# Patient Record
Sex: Male | Born: 1955 | Race: White | Hispanic: No | Marital: Married | Smoking: Former smoker
Health system: Southern US, Community
[De-identification: ages and names within clinical notes are randomized; demographics above are authoritative.]

## PROBLEM LIST (undated history)

## (undated) DIAGNOSIS — E785 Hyperlipidemia, unspecified: Secondary | ICD-10-CM

## (undated) DIAGNOSIS — I2510 Atherosclerotic heart disease of native coronary artery without angina pectoris: Secondary | ICD-10-CM

## (undated) DIAGNOSIS — F329 Major depressive disorder, single episode, unspecified: Secondary | ICD-10-CM

## (undated) DIAGNOSIS — F32A Depression, unspecified: Secondary | ICD-10-CM

## (undated) DIAGNOSIS — G4733 Obstructive sleep apnea (adult) (pediatric): Secondary | ICD-10-CM

## (undated) DIAGNOSIS — G2581 Restless legs syndrome: Secondary | ICD-10-CM

## (undated) DIAGNOSIS — G8929 Other chronic pain: Secondary | ICD-10-CM

## (undated) DIAGNOSIS — E119 Type 2 diabetes mellitus without complications: Secondary | ICD-10-CM

## (undated) DIAGNOSIS — G4700 Insomnia, unspecified: Secondary | ICD-10-CM

## (undated) DIAGNOSIS — N400 Enlarged prostate without lower urinary tract symptoms: Secondary | ICD-10-CM

## (undated) DIAGNOSIS — K219 Gastro-esophageal reflux disease without esophagitis: Secondary | ICD-10-CM

## (undated) DIAGNOSIS — G629 Polyneuropathy, unspecified: Secondary | ICD-10-CM

## (undated) DIAGNOSIS — M549 Dorsalgia, unspecified: Secondary | ICD-10-CM

## (undated) HISTORY — DX: Major depressive disorder, single episode, unspecified: F32.9

## (undated) HISTORY — DX: Restless legs syndrome: G25.81

## (undated) HISTORY — DX: Other chronic pain: G89.29

## (undated) HISTORY — PX: OTHER SURGICAL HISTORY: SHX169

## (undated) HISTORY — DX: Obstructive sleep apnea (adult) (pediatric): G47.33

## (undated) HISTORY — DX: Depression, unspecified: F32.A

## (undated) HISTORY — DX: Benign prostatic hyperplasia without lower urinary tract symptoms: N40.0

## (undated) HISTORY — DX: Polyneuropathy, unspecified: G62.9

## (undated) HISTORY — DX: Dorsalgia, unspecified: M54.9

## (undated) HISTORY — DX: Hyperlipidemia, unspecified: E78.5

## (undated) HISTORY — DX: Atherosclerotic heart disease of native coronary artery without angina pectoris: I25.10

## (undated) HISTORY — DX: Type 2 diabetes mellitus without complications: E11.9

## (undated) HISTORY — DX: Insomnia, unspecified: G47.00

## (undated) HISTORY — DX: Gastro-esophageal reflux disease without esophagitis: K21.9

---

## 1998-01-06 ENCOUNTER — Ambulatory Visit (HOSPITAL_COMMUNITY): Admission: RE | Admit: 1998-01-06 | Discharge: 1998-01-06 | Payer: Self-pay | Admitting: Gastroenterology

## 1998-02-13 ENCOUNTER — Encounter: Payer: Self-pay | Admitting: *Deleted

## 1998-02-13 ENCOUNTER — Inpatient Hospital Stay (HOSPITAL_COMMUNITY): Admission: EM | Admit: 1998-02-13 | Discharge: 1998-02-14 | Payer: Self-pay | Admitting: Obstetrics & Gynecology

## 1998-07-20 ENCOUNTER — Ambulatory Visit (HOSPITAL_COMMUNITY): Admission: RE | Admit: 1998-07-20 | Discharge: 1998-07-20 | Payer: Self-pay | Admitting: Gastroenterology

## 1998-07-20 ENCOUNTER — Encounter: Payer: Self-pay | Admitting: Gastroenterology

## 1999-09-28 ENCOUNTER — Ambulatory Visit (HOSPITAL_COMMUNITY): Admission: RE | Admit: 1999-09-28 | Discharge: 1999-09-28 | Payer: Self-pay | Admitting: Gastroenterology

## 2000-02-05 ENCOUNTER — Encounter: Payer: Self-pay | Admitting: Neurology

## 2000-02-05 ENCOUNTER — Encounter: Admission: RE | Admit: 2000-02-05 | Discharge: 2000-02-05 | Payer: Self-pay | Admitting: Neurology

## 2000-03-05 ENCOUNTER — Encounter: Payer: Self-pay | Admitting: Neurology

## 2000-03-05 ENCOUNTER — Ambulatory Visit (HOSPITAL_COMMUNITY): Admission: RE | Admit: 2000-03-05 | Discharge: 2000-03-05 | Payer: Self-pay | Admitting: Neurology

## 2001-02-03 ENCOUNTER — Encounter: Admission: RE | Admit: 2001-02-03 | Discharge: 2001-05-04 | Payer: Self-pay | Admitting: Anesthesiology

## 2001-02-05 ENCOUNTER — Encounter: Admission: RE | Admit: 2001-02-05 | Discharge: 2001-02-05 | Payer: Self-pay

## 2001-03-27 ENCOUNTER — Encounter (INDEPENDENT_AMBULATORY_CARE_PROVIDER_SITE_OTHER): Payer: Self-pay

## 2001-03-27 ENCOUNTER — Ambulatory Visit (HOSPITAL_COMMUNITY): Admission: RE | Admit: 2001-03-27 | Discharge: 2001-03-27 | Payer: Self-pay | Admitting: Gastroenterology

## 2001-06-04 ENCOUNTER — Encounter: Admission: RE | Admit: 2001-06-04 | Discharge: 2001-09-02 | Payer: Self-pay

## 2001-07-23 ENCOUNTER — Ambulatory Visit (HOSPITAL_COMMUNITY): Admission: RE | Admit: 2001-07-23 | Discharge: 2001-07-23 | Payer: Self-pay

## 2001-09-15 ENCOUNTER — Encounter: Admission: RE | Admit: 2001-09-15 | Discharge: 2001-12-14 | Payer: Self-pay

## 2001-11-09 ENCOUNTER — Encounter: Payer: Self-pay | Admitting: Family Medicine

## 2001-11-09 ENCOUNTER — Encounter: Admission: RE | Admit: 2001-11-09 | Discharge: 2001-11-09 | Payer: Self-pay | Admitting: Family Medicine

## 2001-12-25 ENCOUNTER — Encounter: Admission: RE | Admit: 2001-12-25 | Discharge: 2002-03-02 | Payer: Self-pay

## 2002-04-15 ENCOUNTER — Encounter: Admission: RE | Admit: 2002-04-15 | Discharge: 2002-07-14 | Payer: Self-pay

## 2002-06-08 ENCOUNTER — Encounter: Admission: RE | Admit: 2002-06-08 | Discharge: 2002-06-08 | Payer: Self-pay | Admitting: *Deleted

## 2002-06-08 ENCOUNTER — Encounter: Payer: Self-pay | Admitting: *Deleted

## 2002-06-09 ENCOUNTER — Ambulatory Visit (HOSPITAL_BASED_OUTPATIENT_CLINIC_OR_DEPARTMENT_OTHER): Admission: RE | Admit: 2002-06-09 | Discharge: 2002-06-09 | Payer: Self-pay | Admitting: *Deleted

## 2002-08-18 ENCOUNTER — Encounter: Admission: RE | Admit: 2002-08-18 | Discharge: 2002-11-16 | Payer: Self-pay

## 2002-09-21 ENCOUNTER — Ambulatory Visit (HOSPITAL_COMMUNITY): Admission: RE | Admit: 2002-09-21 | Discharge: 2002-09-21 | Payer: Self-pay

## 2002-12-10 ENCOUNTER — Encounter
Admission: RE | Admit: 2002-12-10 | Discharge: 2003-03-10 | Payer: Self-pay | Admitting: Physical Medicine & Rehabilitation

## 2003-06-14 ENCOUNTER — Encounter
Admission: RE | Admit: 2003-06-14 | Discharge: 2003-09-12 | Payer: Self-pay | Admitting: Physical Medicine & Rehabilitation

## 2003-10-07 ENCOUNTER — Encounter
Admission: RE | Admit: 2003-10-07 | Discharge: 2004-01-05 | Payer: Self-pay | Admitting: Physical Medicine & Rehabilitation

## 2004-01-12 ENCOUNTER — Encounter
Admission: RE | Admit: 2004-01-12 | Discharge: 2004-04-11 | Payer: Self-pay | Admitting: Physical Medicine & Rehabilitation

## 2004-02-28 ENCOUNTER — Ambulatory Visit: Payer: Self-pay | Admitting: Anesthesiology

## 2004-05-11 ENCOUNTER — Encounter
Admission: RE | Admit: 2004-05-11 | Discharge: 2004-07-26 | Payer: Self-pay | Admitting: Physical Medicine & Rehabilitation

## 2004-05-22 ENCOUNTER — Ambulatory Visit (HOSPITAL_COMMUNITY): Admission: RE | Admit: 2004-05-22 | Discharge: 2004-05-22 | Payer: Self-pay | Admitting: Anesthesiology

## 2004-06-19 ENCOUNTER — Ambulatory Visit: Payer: Self-pay | Admitting: Anesthesiology

## 2004-07-10 ENCOUNTER — Ambulatory Visit: Payer: Self-pay | Admitting: Cardiovascular Disease

## 2004-07-26 ENCOUNTER — Encounter: Admission: RE | Admit: 2004-07-26 | Discharge: 2004-10-24 | Payer: Self-pay | Admitting: Family Medicine

## 2004-10-02 ENCOUNTER — Ambulatory Visit: Payer: Self-pay | Admitting: Physical Medicine & Rehabilitation

## 2004-11-02 ENCOUNTER — Encounter
Admission: RE | Admit: 2004-11-02 | Discharge: 2005-01-31 | Payer: Self-pay | Admitting: Physical Medicine & Rehabilitation

## 2004-11-28 ENCOUNTER — Ambulatory Visit: Payer: Self-pay | Admitting: Physical Medicine & Rehabilitation

## 2005-01-22 ENCOUNTER — Ambulatory Visit: Payer: Self-pay | Admitting: Cardiovascular Disease

## 2005-02-11 ENCOUNTER — Ambulatory Visit: Payer: Self-pay | Admitting: Cardiovascular Disease

## 2005-03-01 ENCOUNTER — Encounter: Admission: RE | Admit: 2005-03-01 | Discharge: 2005-05-30 | Payer: Self-pay | Admitting: Anesthesiology

## 2005-03-05 ENCOUNTER — Ambulatory Visit: Payer: Self-pay | Admitting: Anesthesiology

## 2005-03-05 ENCOUNTER — Ambulatory Visit (HOSPITAL_COMMUNITY): Admission: RE | Admit: 2005-03-05 | Discharge: 2005-03-05 | Payer: Self-pay | Admitting: Anesthesiology

## 2005-07-03 ENCOUNTER — Ambulatory Visit: Admission: RE | Admit: 2005-07-03 | Discharge: 2005-07-03 | Payer: Self-pay | Admitting: Orthopaedic Surgery

## 2005-08-21 ENCOUNTER — Ambulatory Visit: Payer: Self-pay | Admitting: Cardiovascular Disease

## 2005-09-10 ENCOUNTER — Ambulatory Visit (HOSPITAL_BASED_OUTPATIENT_CLINIC_OR_DEPARTMENT_OTHER): Admission: RE | Admit: 2005-09-10 | Discharge: 2005-09-10 | Payer: Self-pay | Admitting: Orthopaedic Surgery

## 2006-01-29 ENCOUNTER — Ambulatory Visit: Payer: Self-pay

## 2006-02-04 ENCOUNTER — Encounter: Admission: RE | Admit: 2006-02-04 | Discharge: 2006-02-04 | Payer: Self-pay | Admitting: Orthopaedic Surgery

## 2006-02-06 ENCOUNTER — Ambulatory Visit: Payer: Self-pay | Admitting: Cardiovascular Disease

## 2006-02-07 ENCOUNTER — Ambulatory Visit: Payer: Self-pay | Admitting: Cardiovascular Disease

## 2006-02-07 ENCOUNTER — Inpatient Hospital Stay (HOSPITAL_BASED_OUTPATIENT_CLINIC_OR_DEPARTMENT_OTHER): Admission: RE | Admit: 2006-02-07 | Discharge: 2006-02-07 | Payer: Self-pay | Admitting: Cardiovascular Disease

## 2006-02-18 ENCOUNTER — Ambulatory Visit: Payer: Self-pay | Admitting: Cardiovascular Disease

## 2006-02-20 ENCOUNTER — Inpatient Hospital Stay (HOSPITAL_COMMUNITY): Admission: RE | Admit: 2006-02-20 | Discharge: 2006-02-23 | Payer: Self-pay | Admitting: Orthopaedic Surgery

## 2006-08-14 ENCOUNTER — Ambulatory Visit: Payer: Self-pay | Admitting: Cardiovascular Disease

## 2007-03-25 ENCOUNTER — Ambulatory Visit: Payer: Self-pay | Admitting: Cardiovascular Disease

## 2007-03-26 ENCOUNTER — Ambulatory Visit: Payer: Self-pay

## 2007-03-26 ENCOUNTER — Ambulatory Visit: Payer: Self-pay | Admitting: Pulmonary Disease

## 2007-04-21 ENCOUNTER — Ambulatory Visit (HOSPITAL_BASED_OUTPATIENT_CLINIC_OR_DEPARTMENT_OTHER): Admission: RE | Admit: 2007-04-21 | Discharge: 2007-04-21 | Payer: Self-pay | Admitting: Orthopedic Surgery

## 2007-07-31 ENCOUNTER — Encounter: Admission: RE | Admit: 2007-07-31 | Discharge: 2007-07-31 | Payer: Self-pay | Admitting: Orthopedic Surgery

## 2007-09-24 ENCOUNTER — Ambulatory Visit: Payer: Self-pay | Admitting: Cardiovascular Disease

## 2008-02-23 ENCOUNTER — Encounter: Payer: Self-pay | Admitting: Pulmonary Disease

## 2008-08-15 ENCOUNTER — Encounter: Admission: RE | Admit: 2008-08-15 | Discharge: 2008-08-15 | Payer: Self-pay | Admitting: Family Medicine

## 2008-08-16 ENCOUNTER — Ambulatory Visit: Payer: Self-pay | Admitting: Cardiovascular Disease

## 2009-01-30 ENCOUNTER — Encounter (INDEPENDENT_AMBULATORY_CARE_PROVIDER_SITE_OTHER): Payer: Self-pay | Admitting: *Deleted

## 2009-05-01 DIAGNOSIS — K219 Gastro-esophageal reflux disease without esophagitis: Secondary | ICD-10-CM | POA: Insufficient documentation

## 2009-05-01 DIAGNOSIS — E119 Type 2 diabetes mellitus without complications: Secondary | ICD-10-CM

## 2009-05-01 DIAGNOSIS — G4700 Insomnia, unspecified: Secondary | ICD-10-CM | POA: Insufficient documentation

## 2009-05-01 DIAGNOSIS — I10 Essential (primary) hypertension: Secondary | ICD-10-CM | POA: Insufficient documentation

## 2009-05-01 DIAGNOSIS — F329 Major depressive disorder, single episode, unspecified: Secondary | ICD-10-CM

## 2009-05-01 DIAGNOSIS — F3289 Other specified depressive episodes: Secondary | ICD-10-CM | POA: Insufficient documentation

## 2009-05-01 DIAGNOSIS — I2510 Atherosclerotic heart disease of native coronary artery without angina pectoris: Secondary | ICD-10-CM

## 2009-05-02 ENCOUNTER — Ambulatory Visit: Payer: Self-pay | Admitting: Cardiovascular Disease

## 2009-05-02 DIAGNOSIS — R0602 Shortness of breath: Secondary | ICD-10-CM

## 2009-05-19 ENCOUNTER — Encounter: Payer: Self-pay | Admitting: Pulmonary Disease

## 2009-06-05 ENCOUNTER — Ambulatory Visit: Payer: Self-pay

## 2009-06-05 ENCOUNTER — Encounter: Payer: Self-pay | Admitting: Cardiovascular Disease

## 2009-06-05 ENCOUNTER — Ambulatory Visit: Payer: Self-pay | Admitting: Cardiology

## 2009-06-05 ENCOUNTER — Ambulatory Visit (HOSPITAL_COMMUNITY): Admission: RE | Admit: 2009-06-05 | Discharge: 2009-06-05 | Payer: Self-pay | Admitting: Cardiovascular Disease

## 2009-08-01 ENCOUNTER — Telehealth: Payer: Self-pay | Admitting: Cardiovascular Disease

## 2009-08-02 ENCOUNTER — Encounter: Payer: Self-pay | Admitting: Pulmonary Disease

## 2009-08-28 ENCOUNTER — Telehealth: Payer: Self-pay | Admitting: Cardiovascular Disease

## 2009-08-29 ENCOUNTER — Ambulatory Visit: Payer: Self-pay | Admitting: Pulmonary Disease

## 2009-08-29 DIAGNOSIS — G4733 Obstructive sleep apnea (adult) (pediatric): Secondary | ICD-10-CM

## 2009-08-31 LAB — CONVERTED CEMR LAB
ALT: 19 units/L (ref 0–53)
AST: 24 units/L (ref 0–37)
Albumin: 4.5 g/dL (ref 3.5–5.2)
Alkaline Phosphatase: 49 units/L (ref 39–117)
Basophils Relative: 0.5 % (ref 0.0–3.0)
Bilirubin, Direct: 0.1 mg/dL (ref 0.0–0.3)
Creatinine, Ser: 1.1 mg/dL (ref 0.4–1.5)
Eosinophils Relative: 0.5 % (ref 0.0–5.0)
Ferritin: 38.8 ng/mL (ref 22.0–322.0)
Folate: 9.4 ng/mL
GFR calc non Af Amer: 74.13 mL/min (ref >60)
Glucose, Bld: 85 mg/dL (ref 70–99)
HCT: 45.6 % (ref 39.0–52.0)
Hemoglobin: 15.1 g/dL (ref 13.0–17.0)
Iron: 88 ug/dL (ref 42–165)
Lymphocytes Relative: 22.2 % (ref 12.0–46.0)
Lymphs Abs: 2.1 10*3/uL (ref 0.7–4.0)
MCHC: 33.1 g/dL (ref 30.0–36.0)
MCV: 96.1 fL (ref 78.0–100.0)
Monocytes Absolute: 0.4 10*3/uL (ref 0.1–1.0)
Monocytes Relative: 4.3 % (ref 3.0–12.0)
Neutro Abs: 7 10*3/uL (ref 1.4–7.7)
Platelets: 225 10*3/uL (ref 150.0–400.0)
Potassium: 3.9 meq/L (ref 3.5–5.1)
RDW: 12.1 % (ref 11.5–14.6)
Saturation Ratios: 21.1 % (ref 20.0–50.0)
Sodium: 141 meq/L (ref 135–145)
Total Bilirubin: 0.7 mg/dL (ref 0.3–1.2)
Total Protein: 7.2 g/dL (ref 6.0–8.3)
Transferrin: 298.6 mg/dL (ref 212.0–360.0)
Vitamin B-12: 214 pg/mL (ref 211–911)
WBC: 9.5 10*3/uL (ref 4.5–10.5)

## 2009-09-14 ENCOUNTER — Encounter (INDEPENDENT_AMBULATORY_CARE_PROVIDER_SITE_OTHER): Payer: Self-pay | Admitting: *Deleted

## 2009-10-02 ENCOUNTER — Ambulatory Visit: Payer: Self-pay | Admitting: Pulmonary Disease

## 2009-10-04 ENCOUNTER — Encounter: Payer: Self-pay | Admitting: Pulmonary Disease

## 2009-10-05 ENCOUNTER — Ambulatory Visit: Payer: Self-pay | Admitting: Pulmonary Disease

## 2009-10-17 ENCOUNTER — Telehealth (INDEPENDENT_AMBULATORY_CARE_PROVIDER_SITE_OTHER): Payer: Self-pay | Admitting: *Deleted

## 2009-10-18 ENCOUNTER — Telehealth (INDEPENDENT_AMBULATORY_CARE_PROVIDER_SITE_OTHER): Payer: Self-pay | Admitting: *Deleted

## 2009-10-26 ENCOUNTER — Telehealth (INDEPENDENT_AMBULATORY_CARE_PROVIDER_SITE_OTHER): Payer: Self-pay | Admitting: *Deleted

## 2009-11-20 ENCOUNTER — Ambulatory Visit: Payer: Self-pay | Admitting: Pulmonary Disease

## 2009-11-20 LAB — CONVERTED CEMR LAB
Basophils Absolute: 0 10*3/uL (ref 0.0–0.1)
Basophils Relative: 0.4 % (ref 0.0–3.0)
Eosinophils Absolute: 0.1 10*3/uL (ref 0.0–0.7)
Eosinophils Relative: 1.7 % (ref 0.0–5.0)
Ferritin: 73.9 ng/mL (ref 22.0–322.0)
HCT: 40.6 % (ref 39.0–52.0)
Hemoglobin: 14 g/dL (ref 13.0–17.0)
Iron: 76 ug/dL (ref 42–165)
Lymphocytes Relative: 26.8 % (ref 12.0–46.0)
Lymphs Abs: 1.7 10*3/uL (ref 0.7–4.0)
MCHC: 34.4 g/dL (ref 30.0–36.0)
MCV: 94.1 fL (ref 78.0–100.0)
Monocytes Absolute: 0.5 10*3/uL (ref 0.1–1.0)
Monocytes Relative: 8.2 % (ref 3.0–12.0)
Neutro Abs: 4.1 10*3/uL (ref 1.4–7.7)
Neutrophils Relative %: 62.9 % (ref 43.0–77.0)
Platelets: 203 10*3/uL (ref 150.0–400.0)
RBC: 4.32 M/uL (ref 4.22–5.81)
RDW: 13.3 % (ref 11.5–14.6)
Saturation Ratios: 22.8 % (ref 20.0–50.0)
Transferrin: 237.7 mg/dL (ref 212.0–360.0)
WBC: 6.5 10*3/uL (ref 4.5–10.5)

## 2010-01-15 ENCOUNTER — Telehealth (INDEPENDENT_AMBULATORY_CARE_PROVIDER_SITE_OTHER): Payer: Self-pay | Admitting: *Deleted

## 2010-01-30 ENCOUNTER — Ambulatory Visit: Payer: Self-pay | Admitting: Cardiovascular Disease

## 2010-02-09 ENCOUNTER — Ambulatory Visit: Payer: Self-pay | Admitting: Internal Medicine

## 2010-02-09 ENCOUNTER — Ambulatory Visit: Payer: Self-pay | Admitting: Cardiology

## 2010-02-09 ENCOUNTER — Encounter: Payer: Self-pay | Admitting: Cardiovascular Disease

## 2010-02-09 ENCOUNTER — Ambulatory Visit: Payer: Self-pay

## 2010-02-09 ENCOUNTER — Ambulatory Visit (HOSPITAL_COMMUNITY): Admission: RE | Admit: 2010-02-09 | Discharge: 2010-02-09 | Payer: Self-pay | Admitting: Cardiovascular Disease

## 2010-02-19 ENCOUNTER — Ambulatory Visit: Payer: Self-pay | Admitting: Pulmonary Disease

## 2010-04-03 ENCOUNTER — Telehealth: Payer: Self-pay | Admitting: Pulmonary Disease

## 2010-04-16 ENCOUNTER — Telehealth: Payer: Self-pay | Admitting: Cardiovascular Disease

## 2010-04-23 ENCOUNTER — Telehealth: Payer: Self-pay | Admitting: Pulmonary Disease

## 2010-05-09 ENCOUNTER — Ambulatory Visit: Payer: Self-pay | Admitting: Pulmonary Disease

## 2010-05-09 DIAGNOSIS — C9000 Multiple myeloma not having achieved remission: Secondary | ICD-10-CM

## 2010-05-24 ENCOUNTER — Telehealth (INDEPENDENT_AMBULATORY_CARE_PROVIDER_SITE_OTHER): Payer: Self-pay | Admitting: *Deleted

## 2010-07-01 LAB — CONVERTED CEMR LAB
ALT: 21 units/L (ref 0–53)
AST: 23 units/L (ref 0–37)
Albumin: 4.1 g/dL (ref 3.5–5.2)
Alkaline Phosphatase: 49 units/L (ref 39–117)
BUN: 13 mg/dL (ref 6–23)
Basophils Absolute: 0 10*3/uL (ref 0.0–0.1)
Basophils Relative: 0.4 % (ref 0.0–3.0)
Bilirubin, Direct: 0.1 mg/dL (ref 0.0–0.3)
CO2: 26 meq/L (ref 19–32)
Calcium: 9.1 mg/dL (ref 8.4–10.5)
Chloride: 103 meq/L (ref 96–112)
Creatinine, Ser: 0.9 mg/dL (ref 0.4–1.5)
Eosinophils Absolute: 0.1 10*3/uL (ref 0.0–0.7)
Eosinophils Relative: 1.5 % (ref 0.0–5.0)
GFR calc non Af Amer: 97.02 mL/min (ref >60)
Glucose, Bld: 132 mg/dL — ABNORMAL HIGH (ref 70–99)
HCT: 41.8 % (ref 39.0–52.0)
Hemoglobin: 14.3 g/dL (ref 13.0–17.0)
Lymphocytes Relative: 29.4 % (ref 12.0–46.0)
Lymphs Abs: 1.4 10*3/uL (ref 0.7–4.0)
MCHC: 34.1 g/dL (ref 30.0–36.0)
MCV: 96.3 fL (ref 78.0–100.0)
Monocytes Absolute: 0.4 10*3/uL (ref 0.1–1.0)
Monocytes Relative: 8.9 % (ref 3.0–12.0)
Neutro Abs: 2.8 10*3/uL (ref 1.4–7.7)
Neutrophils Relative %: 59.8 % (ref 43.0–77.0)
Platelets: 200 10*3/uL (ref 150.0–400.0)
Potassium: 4.9 meq/L (ref 3.5–5.1)
RBC: 4.35 M/uL (ref 4.22–5.81)
RDW: 13.5 % (ref 11.5–14.6)
Sodium: 138 meq/L (ref 135–145)
Total Bilirubin: 0.4 mg/dL (ref 0.3–1.2)
Total Protein: 6.8 g/dL (ref 6.0–8.3)
WBC: 4.7 10*3/uL (ref 4.5–10.5)

## 2010-07-03 NOTE — Assessment & Plan Note (Signed)
Summary: insomnia/apc   Primary Provider/Referring Provider:  Lupita Raider  CC:  Sleep consult for insomnia. Patient had sleep study in 2009.Marland Kitchen  History of Present Illness: 55 yo male for evaluation insomnia.  He has been having trouble with his sleep for the past 10 to 12  years.  He does not recall any specific event which triggered his sleep problem.  Of note is that he had a heart attack about 12 years ago.  He has difficulty falling asleep, and staying asleep.  He has been taking ambien 15 mg about 2 hours before going to bed.  He tries to go to bed between 9 pm and 11pm.  He is always worrying about things, and says he has an active mind at night.  He has also tried using lunesta and restoril.  He is using klonopin and xanax for anxiety.  He will take several hours to fall asleep, and will at times try to sleep on the cough.  He will also watch TV, eat, or read.  He gets very anxious about not being able to fall asleep, and tends to look at the clock at night.  He will sleep for two hours, and then wake up.  He then remains awake for the rest of the night.  He has tried taking naps during the day, but is not able to sleep.  He does snore, and this is worse when he is sleeping on his back.  He will occasionally talk in his sleep.  There is no history of sleep walking or bruxism.  He does kick his legs a lot while asleep.  He was told he has neuropathy, and has undergone several back surgeries.  He gets vivid dreams frequently.  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  He gets tired, and feels jittery from her nerves while at work.  He does not drink much alcohol, and drinks a cup of coffee around 3pm.  He has been feeling depressed, and feels like he is losing control.  He has been fighting alot with his wife over trivial matters.  He feels inadequate, and says that he has no sex drive.  He thought this might have been from prozac.  He was switched to wellbutrin, but this has  made him feel more angry.  He has also been coping with his father's diagnosis of bladder cancer in 2009.  He has not seen a psychiatrist before.  He did see a sleep specialist before.   Sleep study from Sept. 22, 2009.  The AHI was 5.5 and RDI was 7.7.  He appeared to have a positional component.   Preventive Screening-Counseling & Management  Alcohol-Tobacco     Smoking Status: current     Year Quit: 2007     Pack years: 30 year smoker x1 ppd     Cigars/week: 1-2 cigars when playing golf  Current Medications (verified): 1)  Lyrica 75 Mg Caps (Pregabalin) .Marland Kitchen.. 1 Tab By Mouth Once Daily 2)  Vytorin 10-40 Mg Tabs (Ezetimibe-Simvastatin) .... Take One Tablet By Mouth Dailyat Bedtime 3)  Oxycodone-Acetaminophen 5-325 Mg Tabs (Oxycodone-Acetaminophen) .Marland Kitchen.. 1 Tab By Mouth Once Daily 4)  Metformin Hcl 500 Mg Tabs (Metformin Hcl) .Marland Kitchen.. 1 Tab Two Times A Day 5)  Lisinopril 10 Mg Tabs (Lisinopril) .Marland Kitchen.. 1 Tab By Mouth Once Daily 6)  Ambien Cr 12.5 Mg Cr-Tabs (Zolpidem Tartrate) .Marland Kitchen.. 1 By Mouth At Bedtime 7)  Flomax 0.4 Mg Caps (Tamsulosin Hcl) .Marland Kitchen.. 1 Tab By Mouth Once  Daily 8)  Nexium 40 Mg Cpdr (Esomeprazole Magnesium) .Marland Kitchen.. 1 Tab By Mouth Once Daily 9)  Wellbutrin Xl 150 Mg Xr24h-Tab (Bupropion Hcl) .Marland Kitchen.. 1 By Mouth Two Times A Day 10)  Fish Oil   Oil (Fish Oil) .Marland Kitchen.. 1 Tab By Mouth Once Daily 11)  Plavix 75 Mg Tabs (Clopidogrel Bisulfate) .... Take One Tablet By Mouth Daily 12)  Klonopin 1 Mg Tabs (Clonazepam) .... 2 By Mouth At Bedtime 13)  Xanax 0.5 Mg Tabs (Alprazolam) .... 2-4 By Mouth At Bedtime As Needed  Allergies (verified): 1)  ! Codeine  Past History:  Past Medical History: Hypertension CAD      - Stents 99', 05', patent circ and IM stents cath 2007 myovue 10/08 non-ischemic Hyperlipidemia Insomnia Depression GERD Diabetes mellitus Peripheral neuropathy Chronic back pain Prostatism  Past Surgical History: Reviewed history from 05/01/2009 and no changes required. Release  of right long finger A1 pulley with debridement of tenosynovitis.     L4-5 epidural steroid injection with fluoroscopic guidance.   He had 95% lesion in the AV circumflex that was dilated and stented with an AVE stent. He also a 60% lesion in the OM-I and minor disease in the LAD and RCA 02/14/98 Physican Dr. Graceann Congress M.D. cc Dr Antoine Poche M.D.  Family History: Reviewed history from 05/01/2009 and no changes required.  Positive for heart disease in the father, hypertension in  the mother, and diabetes in the father.  Social History: Reviewed history from 05/01/2009 and no changes required.  Patient is married.  He is a Psychologist, sport and exercise.  He has no  intake of alcohol or tobacco products.  Does chew Nicorette Gum.  He has no  children.  Caregiver after surgery will be his spouse.Smoking Status:  current Pack years:  30 year smoker x1 ppd Cigars/week:  1-2 cigars when playing golf  Vital Signs:  Patient profile:   55 year old male Height:      72 inches (182.88 cm) Weight:      178 pounds (80.91 kg) BMI:     24.23 O2 Sat:      94 % on Room air Temp:     98.5 degrees F (36.94 degrees C) oral Pulse rate:   86 / minute BP sitting:   100 / 60  (left arm) Cuff size:   regular  Vitals Entered By: Michel Bickers CMA (August 29, 2009 3:22 PM)  O2 Sat at Rest %:  94 O2 Flow:  Room air CC: Sleep consult for insomnia. Patient had sleep study in 2009. Is Patient Diabetic? Yes   Physical Exam  General:  thin.   Eyes:  PERRLA and EOMI.   Nose:  no deformity, discharge, inflammation, or lesions Mouth:  MP 3, no oral lesions Neck:  no JVD.   Chest Wall:  no deformities noted Lungs:  clear bilaterally to auscultation and percussion Heart:  regular rate and rhythm, S1, S2 without murmurs, rubs, gallops, or clicks Abdomen:  bowel sounds positive; abdomen soft and non-tender without masses, or organomegaly Pulses:  pulses normal Extremities:  no clubbing, cyanosis, edema, or deformity  noted Neurologic:  normal CN II-XII, gait normal, and strength normal.   Cervical Nodes:  no significant adenopathy Psych:  depressed affect, anxious, and poor concentration.     Impression & Recommendations:  Problem # 1:  INSOMNIA (ICD-780.52) He has sleep onset and sleep maintenance insomnia.  In addition he has poor sleep hygiene.  My suspicion is that his heart attack in 1999  was the triggering event for his poor sleep habits compounded by his depression and anxiety.  I have asked him to keep a sleep journal.  I explained the importance of stimulus control, relaxation techniques, and sleep restriction.  I will continue him on his current sleep aides for now.  I will also start him on seroquel 50 mg at bedtime to help with sleep initiation and to see if this stablizes his mood also.  Problem # 2:  DEPRESSION (ICD-311) A good portion of his sleep problems are related to his mood.  In addition his sleep problems are exacerbating his mood disorder.  To further evaluate this I will refer him to a behavioral health specialist.  He specifically denied any suicidal or homicidal thoughts.  Will start seroquel 50 mg at bedtime.  Would defer further adjustments in his mood stabilizing medications to primary care and behavioral health.  Problem # 3:  LEG PAIN (ICD-729.5) He has a history of neuropathy.  His leg symptoms seem to get worse at night.  He may also have restless leg syndrome.  I will check his iron levels to see if iron supplementation may help his leg symptoms.  Problem # 4:  OBSTRUCTIVE SLEEP APNEA (ICD-327.23) His sleep study from 2009 showed mild sleep apnea.  Will arrange for [facility] home sleep test to further evaluate whether this may be contributing to his sleep difficulties.  Medications Added to Medication List This Visit: 1)  Seroquel 50 Mg Tabs (Quetiapine fumarate) .... One by mouth at bedtime 2)  Wellbutrin Xl 150 Mg Xr24h-tab (Bupropion hcl) .Marland Kitchen.. 1 by mouth two times a  day 3)  Ambien Cr 12.5 Mg Cr-tabs (Zolpidem tartrate) .Marland Kitchen.. 1 by mouth at bedtime 4)  Klonopin 1 Mg Tabs (Clonazepam) .... 2 by mouth at bedtime 5)  Xanax 0.5 Mg Tabs (Alprazolam) .... 2-4 by mouth at bedtime as needed  Complete Medication List: 1)  Plavix 75 Mg Tabs (Clopidogrel bisulfate) .... Take one tablet by mouth daily 2)  Vytorin 10-40 Mg Tabs (Ezetimibe-simvastatin) .... Take one tablet by mouth dailyat bedtime 3)  Fish Oil Oil (Fish oil) .Marland Kitchen.. 1 tab by mouth once daily 4)  Lisinopril 10 Mg Tabs (Lisinopril) .Marland Kitchen.. 1 tab by mouth once daily 5)  Metformin Hcl 500 Mg Tabs (Metformin hcl) .Marland Kitchen.. 1 tab two times a day 6)  Nexium 40 Mg Cpdr (Esomeprazole magnesium) .Marland Kitchen.. 1 tab by mouth once daily 7)  Flomax 0.4 Mg Caps (Tamsulosin hcl) .Marland Kitchen.. 1 tab by mouth once daily 8)  Seroquel 50 Mg Tabs (Quetiapine fumarate) .... One by mouth at bedtime 9)  Wellbutrin Xl 150 Mg Xr24h-tab (Bupropion hcl) .Marland Kitchen.. 1 by mouth two times a day 10)  Ambien Cr 12.5 Mg Cr-tabs (Zolpidem tartrate) .Marland Kitchen.. 1 by mouth at bedtime 11)  Klonopin 1 Mg Tabs (Clonazepam) .... 2 by mouth at bedtime 12)  Xanax 0.5 Mg Tabs (Alprazolam) .... 2-4 by mouth at bedtime as needed 13)  Lyrica 75 Mg Caps (Pregabalin) .Marland Kitchen.. 1 tab by mouth once daily 14)  Oxycodone-acetaminophen 5-325 Mg Tabs (Oxycodone-acetaminophen) .Marland Kitchen.. 1 tab by mouth once daily  Other Orders: Consultation Level IV ([address]) Sleep Disorder Referral (Sleep Disorder) Psychiatric Referral (Psych) TLB-BMP (Basic Metabolic Panel-BMET) (80048-METABOL) TLB-CBC Platelet - w/Differential (85025-CBCD) TLB-Hepatic/Liver Function Pnl (80076-HEPATIC) TLB-B12 + Folate Pnl (60454_09811-B14/NWG) TLB-IBC Pnl (Iron/FE;Transferrin) (83550-IBC) TLB-Ferritin (82728-FER)  Patient Instructions: 1)  Keep sleep diary for two weeks 2)  Try breathing exercises when having trouble falling asleep 3)  Use your bedroom for  sleeping only 4)  Try writing down your thoughts before going to bed 5)   Stick to a strict sleep and wake time 6)  Seroquel 50 mg at bedtime 7)  Will refer to behavioral health specialist 8)  Blood test today 9)  Will schedule sleep test at home ([facility]) 10)  Will arrange for psychiatry referral 11)  Follow up in 3 weeks Prescriptions: SEROQUEL 50 MG TABS (QUETIAPINE FUMARATE) one by mouth at bedtime  #30 x 1   Entered by:   Zackery Barefoot CMA   Authorized by:   Coralyn Helling MD   Signed by:   Zackery Barefoot CMA on 08/29/2009   Method used:   [facility]        [facility]  [place] [address]       [address]       [place], [place]  [address]       Ph: [phone] or [phone]       Fax: [phone]   RxID:   [id number] SEROQUEL 50 MG TABS (QUETIAPINE FUMARATE) one by mouth at bedtime  #30 x 1   Entered and Authorized by:   Coralyn Helling MD   Signed by:   Coralyn Helling MD on 08/29/2009   Method used:   Print then Give to Patient   RxID:   [id number]

## 2010-07-03 NOTE — Progress Notes (Signed)
Summary: rx refill on ambien  Phone Note Call from Patient   Caller: Patient Call For: [name] [name] Summary of Call: pt wants at least a 10 day supply or a month's supply of zolpidem tartrate (generic ambian). also wants rx called in to [facility] for 3 months supply. pt's cell [phone] Initial call taken by: Tivis Ringer, CNA,  April 23, 2010 12:48 PM  Follow-up for Phone Call        Needs appt- LMOMTCB Vernie Murders  April 23, 2010 2:27 PM  Spoke with pt and advised needs followup sched before refilling med.  He states that the reason for his last noshow was that he is being tested for bonemarrow CA and has been going through alot.  I advised that we can go ahead and refill zolpidem for 30 day supply, but I will need to get approval from [facility] for 90 day supply.  Pt is fine with this.  He sched rov with [facility] for 05/09/10 at 12 noon.  Pls advise if okay to send 90 day supply zolpidem, thanks!  Follow-up by: Vernie Murders,  April 23, 2010 2:54 PM  Additional Follow-up for Phone Call Additional follow up Details #1::        Yes, it is okay to fill 90 day script. Additional Follow-up by: Coralyn Helling MD,  April 24, 2010 6:28 AM    Additional Follow-up for Phone Call Additional follow up Details #2::    [facility] and [facility]. Carron Curie CMA  April 24, 2010 8:58 AM   Prescriptions: ZOLPIDEM TARTRATE 10 MG TABS (ZOLPIDEM TARTRATE) one by mouth at bedtime  #90 x 0   Entered by:   Carron Curie CMA   Authorized by:   Coralyn Helling MD   Signed by:   Carron Curie CMA on 04/24/2010   Method used:   Telephoned to ...       [facility] (mail-order)             , [place]         Ph: [phone]       Fax: [phone]   RxID:   [id number] ZOLPIDEM TARTRATE 10 MG TABS (ZOLPIDEM TARTRATE) one by mouth at bedtime  #30 x 0   Entered by:   Vernie Murders   Authorized by:   Coralyn Helling MD   Signed by:   Vernie Murders on 04/23/2010   Method used:    Telephoned to ...       [facility]  [place] [address] (retail)       [address]       [place], [place]  [address]       Ph: [phone] or [phone]       Fax: [phone]   RxID:   [id number]

## 2010-07-03 NOTE — Letter (Signed)
Summary: [name] [name] at [facility]   Imported By: Lennie Odor 02/26/2010 11:26:20  _____________________________________________________________________  External Attachment:    Type:   Image     Comment:   External Document

## 2010-07-03 NOTE — Assessment & Plan Note (Signed)
Summary: 3 months/apc   Copy to:  Burna Sis, Charlton Haws Primary Provider/Referring Provider:  Lupita Raider  CC:  3 month followup.  Pt states not sleeping well due to back pain and neuropathy.  He states that he has tried sleeping on his side and this does not help.  He states that he never consulted with Dentist regarding dental appliance. Marland Kitchen  History of Present Illness: 55 yo male with insomnia, RLS, depression, and OSA.  He has not checked with his dentist yet about whether he could get an oral appliance.  He has been getting more trouble with pains in his legs.  He was told he has neuropathy, and was started on lyrica.  This has helped.  He goes to bed at 1030 pm.  He falls asleep after about 30 min.  He uses 50 mg of seroquel and 10 mg zolpidem before going to bed.  He is sleeping through the night.  He wakes up at 7am, and feels okay.  He does okay during the day when he is busy, but will get drowzy if he is sitting quiet.  He has been getting trouble with his breathing.  He quit smoking 8 years ago, and used to smoke 2 packs per day.  He gets winded with exertion, and gets chest tightness.  He has sinus congestion, and has allergies with ragweed.  He does not have much cough or sputum.  He does not wheeze.  He denies recent fever, and has not coughed up blood.  Labs from 01/30/10 were reviewed and unremarkable.  CXR  Procedure date:  01/31/2010  Findings:       CHEST - 2 VIEW    Comparison: 05/02/2009    FINDINGS:  The lungs are mildly hyperexpanded but clear   bilaterally.  No confluent airspace opacities, pleural effuions or   pneumothoracies are seen.  The  heart is normal in size in contour.   A coronary artery stent is visualized.  The upper abdomen and   osseous structures are normal.    IMPRESSION:    No acute cardiopulmonary disease.   Echocardiogram  Procedure date:  02/09/2010  Findings:      Study Conclusions            - Left ventricle: The  cavity size was normal. Wall thickness was       normal. Systolic function was normal. The estimated ejection       fraction was in the range of 55% to 60%. Wall motion was normal;       there were no regional wall motion abnormalities. Features are       consistent with a pseudonormal left ventricular filling pattern,       with concomitant abnormal relaxation and increased filling       pressure (grade 2 diastolic dysfunction).     - Aortic valve: There was no stenosis.     - Mitral valve: Trivial regurgitation.     - Left atrium: The atrium was mildly dilated.     - Right ventricle: The cavity size was normal. Systolic function was       normal.     - Right atrium: The atrium was mildly dilated.     - Pulmonary arteries: PA peak pressure: 33mm Hg (S).     - Inferior vena cava: The vessel was normal in size; the       respirophasic diameter changes were in the normal range (= 50%);  findings are consistent with normal central venous pressure.     Impressions:            - Normal LV size and systolic function, EF 55-60%. No regional wall       motion abnormalities. Moderate diastolic dysfunction. No       significant valvular abnormalities. Mild biatrial enlargement.       Normal RV size and systolic function.   Current Medications (verified): 1)  Plavix 75 Mg Tabs (Clopidogrel Bisulfate) .... Take One Tablet By Mouth Daily 2)  Vytorin 10-40 Mg Tabs (Ezetimibe-Simvastatin) .... Take One Tablet By Mouth Dailyat Bedtime 3)  Fish Oil   Oil (Fish Oil) .Marland Kitchen.. 1 Tab By Mouth Once Daily 4)  Lisinopril 10 Mg Tabs (Lisinopril) .... One Half Tab By Mouth Once Daily 5)  Metformin Hcl 500 Mg Tabs (Metformin Hcl) .Marland Kitchen.. 1 Tab Two Times A Day 6)  Nexium 40 Mg Cpdr (Esomeprazole Magnesium) .Marland Kitchen.. 1 Tab By Mouth Once Daily 7)  Flomax 0.4 Mg Caps (Tamsulosin Hcl) .Marland Kitchen.. 1 Tab By Mouth Once Daily 8)  Seroquel 25 Mg Tabs (Quetiapine Fumarate) .... 2 By Mouth At Bedtime 9)  Prozac 20 Mg Caps (Fluoxetine  Hcl) .Marland Kitchen.. 1 By Mouth Daily 10)  Xanax 0.5 Mg Tabs (Alprazolam) .Marland Kitchen.. 1 Once Daily As Needed 11)  Lyrica 75 Mg Caps (Pregabalin) .Marland Kitchen.. 1 Tab By Mouth Once Daily 12)  Oxycodone-Acetaminophen 5-325 Mg Tabs (Oxycodone-Acetaminophen) .Marland Kitchen.. 1 By Mouth 3-4 Times Daily 13)  Zolpidem Tartrate 10 Mg Tabs (Zolpidem Tartrate) .... One By Mouth At Bedtime  Allergies (verified): 1)  ! Codeine  Past History:  Past Medical History: Reviewed history from 11/20/2009 and no changes required. Hypertension CAD      - Stents 99', 05', patent circ and IM stents cath 2007 myovue 10/08 non-ischemic Hyperlipidemia Insomnia Depression Restless leg syndrome      - Responded to iron supplementation OSA      - AHI 5 from home sleep test 10/05/09 GERD Diabetes mellitus Peripheral neuropathy Chronic back pain Prostatism  Past Surgical History: Reviewed history from 10/02/2009 and no changes required. Release of right long finger A1 pulley with debridement of tenosynovitis.  L4-5 epidural steroid injection with fluoroscopic guidance. He had 95% lesion in the AV circumflex that was dilated and stented with an AVE stent. He also a 60% lesion in the OM-I and minor disease in the LAD and RCA 02/14/98 Physican Dr. Graceann Congress M.D. cc Dr Antoine Poche M.D.  Vital Signs:  Patient profile:   55 year old male Weight:      186 pounds O2 Sat:      98 % on Room air Temp:     98.0 degrees F oral Pulse rate:   70 / minute BP sitting:   118 / 78  (left arm)  Vitals Entered By: Vernie Murders (February 19, 2010 1:22 PM)  O2 Flow:  Room air  Physical Exam  General:  thin.   Nose:  no deformity, discharge, inflammation, or lesions Mouth:  MP 3, no oral lesions Neck:  no JVD.   Chest Wall:  no deformities noted Lungs:  clear bilaterally to auscultation and percussion Heart:  regular rate and rhythm, S1, S2 without murmurs, rubs, gallops, or clicks Extremities:  no clubbing, cyanosis, edema, or deformity  noted Neurologic:  normal CN II-XII, gait normal, and strength normal.   Cervical Nodes:  no significant adenopathy Psych:  depressed affect, anxious, and poor concentration.     Impression & Recommendations:  Problem # 1:  DYSPNEA (ICD-786.05) He has an extensive prior history of smoking.  I will have him use as needed albuterol, and arrange for pulmonary function testing.  He does also have evidence for grade 2 diastolic dysfunction on recent Echo.  He is to follow up with primary care and cardiology for this.  Problem # 2:  OBSTRUCTIVE SLEEP APNEA (ICD-327.23) He has mild sleep apnea. He has cardiovascular disease and depression.  I will refer him to Dr. Althea Grimmer to assess whether he would be a suitable candidate for an oral appliance.  Given his history of anxiety and depression, I am concerned that he would not be able to tolerate wearing a CPAP mask.  Problem # 3:  RESTLESS LEG SYNDROME (ICD-333.94) Most of his current leg symptoms seem related to neuropathy.  Problem # 4:  INSOMNIA (ICD-780.52) This has improved, and he appears to have better sleeping habits.  Will have him gradually decrease seroquel.  If he is able to come of seroquel successfully will then try to wean him of zolpidem.  Problem # 5:  HYPERTENSION (ICD-401.9) He is to follow up with primary care and cardiology.  Problem # 6:  DIZZINESS (ICD-780.4) Recent CT head was negative.  He is to follow up with primary care.  Medications Added to Medication List This Visit: 1)  Seroquel 25 Mg Tabs (Quetiapine fumarate) .... 2 by mouth at bedtime 2)  Xanax 0.5 Mg Tabs (Alprazolam) .Marland Kitchen.. 1 once daily as needed 3)  Proair Hfa 108 (90 Base) Mcg/act Aers (Albuterol sulfate) .... Two puffs four times per day as needed  Complete Medication List: 1)  Plavix 75 Mg Tabs (Clopidogrel bisulfate) .... Take one tablet by mouth daily 2)  Vytorin 10-40 Mg Tabs (Ezetimibe-simvastatin) .... Take one tablet by mouth dailyat bedtime 3)   Fish Oil Oil (Fish oil) .Marland Kitchen.. 1 tab by mouth once daily 4)  Lisinopril 10 Mg Tabs (Lisinopril) .... One half tab by mouth once daily 5)  Metformin Hcl 500 Mg Tabs (Metformin hcl) .Marland Kitchen.. 1 tab two times a day 6)  Nexium 40 Mg Cpdr (Esomeprazole magnesium) .Marland Kitchen.. 1 tab by mouth once daily 7)  Flomax 0.4 Mg Caps (Tamsulosin hcl) .Marland Kitchen.. 1 tab by mouth once daily 8)  Seroquel 25 Mg Tabs (Quetiapine fumarate) .... 2 by mouth at bedtime 9)  Prozac 20 Mg Caps (Fluoxetine hcl) .Marland Kitchen.. 1 by mouth daily 10)  Xanax 0.5 Mg Tabs (Alprazolam) .Marland Kitchen.. 1 once daily as needed 11)  Lyrica 75 Mg Caps (Pregabalin) .Marland Kitchen.. 1 tab by mouth once daily 12)  Oxycodone-acetaminophen 5-325 Mg Tabs (Oxycodone-acetaminophen) .Marland Kitchen.. 1 by mouth 3-4 times daily 13)  Zolpidem Tartrate 10 Mg Tabs (Zolpidem tartrate) .... One by mouth at bedtime 14)  Proair Hfa 108 (90 Base) Mcg/act Aers (Albuterol sulfate) .... Two puffs four times per day as needed  Other Orders: Est. Patient Level IV ([address]) Full Pulmonary Function Test (PFT) Dental Referral (Dentist)  Patient Instructions: 1)  Proair two puffs up to four times per day as needed 2)  Will schedule breathing test (PFT) 3)  Will arrange for referral to Orthodonist to assess for oral appliance for sleep apnea 4)  Try using seroquel 2 pills alternating with 1 pill at bedtime.  If okay after a few weeks, then use seroquel 1 pill at bedtime. 5)  Follow up in 6 weeks Prescriptions: PROAIR HFA 108 (90 BASE) MCG/ACT AERS (ALBUTEROL SULFATE) two puffs four times per day as needed  #1 x 3  Entered and Authorized by:   Coralyn Helling MD   Signed by:   Coralyn Helling MD on 02/19/2010   Method used:   [facility]  [place] [address] [facility]       [address]       [place], [place]  [address]       Ph: [phone] or [phone]       Fax: [phone]   RxID:   [id number]

## 2010-07-03 NOTE — Letter (Signed)
Summary: Appointment - Reminder 2  [employer], Main Office  [address]   [place], [place] [address]   Phone: [phone]  Fax: [phone]     September 14, 2009 MRN: [id number]   [name] [address] [place], [place]  [address]   Dear Mr. Mayo,  Our records indicate that it is time to schedule a follow-up appointment with Dr. Eden Emms. It is very important that we reach you to schedule this appointment. We look forward to participating in your health care needs. Please contact us at the number listed above at your earliest convenience to schedule your appointment.  If you are unable to make an appointment at this time, give [place] a call so we can update our records.     Sincerely,   Migdalia Dk [facility] Scheduling Team

## 2010-07-03 NOTE — Progress Notes (Signed)
Summary: Pt needs a refill out of Plavix   Phone Note Refill Request Message from:  Patient on April 16, 2010 2:10 PM  Refills Requested: Medication #1:  PLAVIX 75 MG TABS Take one tablet by mouth daily [facility] [phone]  Initial call taken by: Judie Grieve,  April 16, 2010 2:11 PM    Prescriptions: PLAVIX 75 MG TABS (CLOPIDOGREL BISULFATE) Take one tablet by mouth daily  #90 x 3   Entered by:   Kem Parkinson   Authorized by:   Colon Branch, MD, [facility]   Signed by:   Kem Parkinson on 04/17/2010   Method used:   Faxed to ...       [facility] (mail-order)             , [place]         Ph: [phone]       Fax: [phone]   RxID:   [id number]

## 2010-07-03 NOTE — Progress Notes (Signed)
Summary: notes fax from 3/1 pre- med. pt in office now   Phone Note From Other Clinic   Caller: [facility] [phone]/  fax [phone] Request: Talk with Nurse Details for Reason: pt in office now, need noted from 3/1 regarding pre meds.  Initial call taken by: Lorne Skeens,  August 28, 2009 9:57 AM  Follow-up for Phone Call        08/28/09 pt in dentist chair--wanting clearance for dental cleaning--clearance faxed to dentist--nt Follow-up by: Ledon Snare, RN,  August 28, 2009 10:36 AM

## 2010-07-03 NOTE — Progress Notes (Signed)
Summary: seroquel  Phone Note From Pharmacy   Caller: Britta Mccreedy w/ [facility] Call For: sood  Summary of Call: will pt be on SEROQUEL long enough to get a 90 day supply? ref # [id number]. call [phone] Initial call taken by: Tivis Ringer, CNA,  Oct 26, 2009 1:15 PM  Follow-up for Phone Call        [facility] aware will not know this until pt has f/u with dr Craige Cotta on 6/20--[facility] states they will call back after this date Follow-up by: Philipp Deputy CMA,  Oct 26, 2009 3:04 PM

## 2010-07-03 NOTE — Assessment & Plan Note (Signed)
Summary: followup//lmr   Visit Type:  Follow-up Copy to:  Burna Sis, Charlton Haws Primary Provider/Referring Provider:  Lupita Raider  CC:  Pt is here for follow-up...breathing is nor better but no worse...cannot sleep w/o Ambien...new DX: multiple myeloma.  History of Present Illness: 55 yo male with insomnia, depression, and OSA.  He opted against oral appliance.  He did not get PFT done.  He was recently dx with multiple myeloma and started on chemo with Dr. Jeanie Sewer in [facility].    He is sleeping okay.  He is using ambien at bedtime, and as needed xanax.  He is not using seroquel anymore.  He tried using proair, but this didn't help much.   Current Medications (verified): 1)  Plavix 75 Mg Tabs (Clopidogrel Bisulfate) .... Take One Tablet By Mouth Daily 2)  Vytorin 10-40 Mg Tabs (Ezetimibe-Simvastatin) .... Take One Tablet By Mouth Dailyat Bedtime 3)  Fish Oil   Oil (Fish Oil) .Marland Kitchen.. 1 Tab By Mouth Once Daily 4)  Lisinopril 10 Mg Tabs (Lisinopril) .... One Half Tab By Mouth Once Daily 5)  Metformin Hcl 500 Mg Tabs (Metformin Hcl) .Marland Kitchen.. 1 Tab Two Times A Day 6)  Nexium 40 Mg Cpdr (Esomeprazole Magnesium) .Marland Kitchen.. 1 Tab By Mouth Once Daily 7)  Flomax 0.4 Mg Caps (Tamsulosin Hcl) .Marland Kitchen.. 1 Tab By Mouth Once Daily 8)  Prozac 20 Mg Caps (Fluoxetine Hcl) .Marland Kitchen.. 1 By Mouth Daily 9)  Xanax 0.5 Mg Tabs (Alprazolam) .Marland Kitchen.. 1 Once Daily As Needed 10)  Oxycodone-Acetaminophen 5-325 Mg Tabs (Oxycodone-Acetaminophen) .Marland Kitchen.. 1 By Mouth 3-4 Times Daily 11)  Zolpidem Tartrate 10 Mg Tabs (Zolpidem Tartrate) .... One By Mouth At Bedtime 12)  Proair Hfa 108 (90 Base) Mcg/act Aers (Albuterol Sulfate) .... Two Puffs Four Times Per Day As Needed 13)  Gabapentin 300 Mg Caps (Gabapentin) .Marland Kitchen.. 1 By Mouth Three Times A Day 14)  Chemotherapy Treatment .... Once Weekly For 6 Months;              Dr. Jeanie Sewer  Allergies (verified): 1)  ! Codeine  Past History:  Past Medical History: Hypertension CAD      -  Stents 99', 05', patent circ and IM stents cath 2007 myovue 10/08 non-ischemic Hyperlipidemia Insomnia Depression Restless leg syndrome      - Responded to iron supplementation OSA      - AHI 5 from home sleep test 10/05/09 GERD Diabetes mellitus Peripheral neuropathy Chronic back pain Prostatism Multiple myeloma dx Nov. 2011      - Followed by Dr. Jeanie Sewer in [facility]  Past Surgical History: Reviewed history from 10/02/2009 and no changes required. Release of right long finger A1 pulley with debridement of tenosynovitis.  L4-5 epidural steroid injection with fluoroscopic guidance. He had 95% lesion in the AV circumflex that was dilated and stented with an AVE stent. He also a 60% lesion in the OM-I and minor disease in the LAD and RCA 02/14/98 Physican Dr. Graceann Congress M.D. cc Dr Antoine Poche M.D.  Vital Signs:  Patient profile:   55 year old male Height:      72 inches (182.88 cm) Weight:      185.13 pounds (84.15 kg) BMI:     25.20 O2 Sat:      100 % on Room air Temp:     97.8 degrees F (36.56 degrees C) oral Pulse rate:   96 / minute BP sitting:   110 / 76  (left arm) Cuff size:   regular  Vitals  Entered By: Michel Bickers CMA (May 09, 2010 11:47 AM)  O2 Sat at Rest %:  100 O2 Flow:  Room air CC: Pt is here for follow-up...breathing is nor better but no worse...cannot sleep w/o Ambien...new DX: multiple myeloma Comments Medications reviewed with patient Michel Bickers [facility]  May 09, 2010 11:54 AM   Physical Exam  General:  thin.   Nose:  no deformity, discharge, inflammation, or lesions Mouth:  MP 3, no oral lesions Neck:  no JVD.   Lungs:  clear bilaterally to auscultation and percussion Heart:  regular rate and rhythm, S1, S2 without murmurs, rubs, gallops, or clicks Extremities:  no clubbing, cyanosis, edema, or deformity noted Cervical Nodes:  no significant adenopathy Psych:  alert and cooperative; normal mood and affect; normal attention span and  concentration   Impression & Recommendations:  Problem # 1:  DYSPNEA (ICD-786.05)  He did not notice much difference from inhaler therapy.  He does not feel this is much of a problem at present.  Will re-assess after therapy for his MM.  Problem # 2:  OBSTRUCTIVE SLEEP APNEA (ICD-327.23)  He opted against oral appliance.  He has mild sleep apnea.  Will defer further assessment of this until his MM is controlled.  Problem # 3:  INSOMNIA (ICD-780.52)  He is to continue [place] and as needed xanax.  Will re-address this once his MM is under control.  Problem # 4:  MULTIPLE  MYELOMA (ICD-203.00) He has started therapy for this with Dr. Jeanie Sewer in [facility].  Medications Added to Medication List This Visit: 1)  Gabapentin 300 Mg Caps (Gabapentin) .Marland Kitchen.. 1 by mouth three times a day 2)  Chemotherapy Treatment  .... Once weekly for 6 months;              dr. Jeanie Sewer  Complete Medication List: 1)  Plavix 75 Mg Tabs (Clopidogrel bisulfate) .... Take one tablet by mouth daily 2)  Vytorin 10-40 Mg Tabs (Ezetimibe-simvastatin) .... Take one tablet by mouth dailyat bedtime 3)  Fish Oil Oil (Fish oil) .Marland Kitchen.. 1 tab by mouth once daily 4)  Lisinopril 10 Mg Tabs (Lisinopril) .... One half tab by mouth once daily 5)  Metformin Hcl 500 Mg Tabs (Metformin hcl) .Marland Kitchen.. 1 tab two times a day 6)  Nexium 40 Mg Cpdr (Esomeprazole magnesium) .Marland Kitchen.. 1 tab by mouth once daily 7)  Flomax 0.4 Mg Caps (Tamsulosin hcl) .Marland Kitchen.. 1 tab by mouth once daily 8)  Prozac 20 Mg Caps (Fluoxetine hcl) .Marland Kitchen.. 1 by mouth daily 9)  Xanax 0.5 Mg Tabs (Alprazolam) .Marland Kitchen.. 1 once daily as needed 10)  Oxycodone-acetaminophen 5-325 Mg Tabs (Oxycodone-acetaminophen) .Marland Kitchen.. 1 by mouth 3-4 times daily 11)  Zolpidem Tartrate 10 Mg Tabs (Zolpidem tartrate) .... One by mouth at bedtime 12)  Proair Hfa 108 (90 Base) Mcg/act Aers (Albuterol sulfate) .... Two puffs four times per day as needed 13)  Gabapentin 300 Mg Caps (Gabapentin) .Marland Kitchen.. 1 by mouth  three times a day 14)  Chemotherapy Treatment  .... Once weekly for 6 months;              dr. Jeanie Sewer  Other Orders: Est. Patient Level III [phone])  Patient Instructions: 1)  Follow up in 4 months   Immunization History:  Influenza Immunization History:    Influenza:  historical (04/03/2010)  Pneumovax Immunization History:    Pneumovax:  historical (04/03/2010)

## 2010-07-03 NOTE — Progress Notes (Signed)
Summary: rx for seroquel and ambien   Phone Note Call from Patient Call back at Work Phone [phone]   Caller: Patient Call For: sood Reason for Call: Refill Medication, Talk to Nurse Summary of Call: Need a 90 day supply of Seroquel and Ambien sent in to [facility].  Insurance is calling this a maintenance med now. Initial call taken by: Eugene Gavia,  Oct 17, 2009 1:50 PM  Follow-up for Phone Call        pt was just seen by [facility] on 10-02-2009 and started on Ambien and Seroquel. Pt does have pending appt scheduled for 11-20-2009. Please advise if ok or not to send 90 day supply of these meds to [facility].  Thanks.  Arman Filter LPN  Oct 17, 2009 1:57 PM   Additional Follow-up for Phone Call Additional follow up Details #1::        Wait until appt in June to re-assess his need for these medications. Additional Follow-up by: Coralyn Helling MD,  Oct 18, 2009 9:47 AM

## 2010-07-03 NOTE — Progress Notes (Signed)
Summary: stop plavix & premeds   Phone Note From Other Clinic   Caller: nurse Ann Summary of Call: Per Dewayne Hatch pt having some dental work and needs to know if he needs any premeds and also to come off his plavix. ofc [phone] fax [phone] Initial call taken by: Edman Circle,  August 01, 2009 12:46 PM  Follow-up for Phone Call        spoke with ann, pt needs a deep cleaning. told ann he did not need premed and she said some doctors stop the plavix and some do not. she wanted to know what dr Eden Emms would like. will foward for his review Deliah Goody, RN  August 01, 2009 3:51 PM  Additional Follow-up for Phone Call Additional follow up Details #1::        Should not have to stop Plavix for cleaning.  But if dentist wants him to its ok.  No SBE prophylaxis Additional Follow-up by: Colon Branch, MD, [facility],  August 03, 2009 8:33 AM

## 2010-07-03 NOTE — Progress Notes (Signed)
Summary: nos appt  Phone Note Call from Patient   Caller: [email or web address]  Call For: Samhita Kretsch Summary of Call: ATC pt to rsc nos from 10/31 appt, no VM. Initial call taken by: Darletta Moll,  April 03, 2010 3:35 PM

## 2010-07-03 NOTE — Progress Notes (Signed)
Summary: 90 day versus 30 supply refill  Phone Note Call from Patient Call back at Work Phone [phone]   Caller: Patient Call For: [facility] Summary of Call: Pt states with local [facility] after the 3rd refill on Ambien he will have to pay full retail of $200 for a 30 day supply vs paying $10-$15 for a 90 supply with [facility] since he has met his deductable. Pt states he is sleeping most of the night with the combination of Ambien and Seroquel and states he takes Seroquel 25mg  up to two tablets. Pt has enough of Seroquel to last until June appointment but will need to refill Ambien by then. Please advise. Zackery Barefoot CMA  Oct 18, 2009 10:13 AM   Follow-up for Phone Call        Okay to fill zolpidem 10 mg at bedtime as needed, dispense 90 pills with no refills. Follow-up by: Coralyn Helling MD,  Oct 18, 2009 3:53 PM     Appended Document: 90 day versus 30 supply refill [id number]

## 2010-07-03 NOTE — Letter (Signed)
Summary: Phone Encounter/[facility]  Phone Encounter/[facility]   Imported By: Sherian Rein 09/21/2009 09:02:51  _____________________________________________________________________  External Attachment:    Type:   Image     Comment:   External Document

## 2010-07-03 NOTE — Assessment & Plan Note (Signed)
Summary: apena link only appt just for the reading charge/cb   Copy to:  Burna Sis Primary Provider/Referring Provider:  Lupita Raider   History of Present Illness: Apnealink from 10/05/09 AHI 5.  Discussed with patient.  Will try positional therapy, and weight control.  Allergies: 1)  ! Codeine   Other Orders: Sleep Std Airflow/Heartrate and O2 SAT unattended ([address])

## 2010-07-03 NOTE — Assessment & Plan Note (Signed)
Summary: rov  Medications Added LISINOPRIL 10 MG TABS (LISINOPRIL) one half tab by mouth once daily      Allergies Added: \   Referring Provider:  Burna Sis Primary Provider:  Lupita [name]  CC:  dizziness and sob.  History of Present Illness: [name] [name] is seen today for F/U of CAD, HTN elevated lipids and dyspnea.  He has a history of stenting of the circ and IM.  He had a non-ischemic myovue in 2008.  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  He has had a few episodes of sudden dyspnea.  He is not wheezing or coughing. There has been no fever or sputum.  He is a non-smoker with no history of CHF or edema.  He has been compliant with his meds.  He has not had a CXR this year  He sees Dr Craige Cotta for apnea.  He feels horrible for the last 2 months.  He complains of postural dizzyness, increasing dyspnea and worsening neuropathy in the LE's.  he has had a headache with dizzyness and nausea wtch sounds somewhat vertiginous.  No other focal neurological signs.  No palpitations, SSCP, LE pain or edema.    Current Problems (verified): 1)  Dizziness  (ICD-780.4) 2)  Restless Leg Syndrome  (ICD-333.94) 3)  Obstructive Sleep Apnea  (ICD-327.23) 4)  Leg Pain  (ICD-729.5) 5)  Dyspnea  (ICD-786.05) 6)  Hypertension  (ICD-401.9) 7)  Cad  (ICD-414.00) 8)  Insomnia  (ICD-780.52) 9)  Depression  (ICD-311) 10)  Gerd  (ICD-530.81) 11)  Dm  (ICD-250.00)  Current Medications (verified): 1)  Plavix 75 Mg Tabs (Clopidogrel Bisulfate) .... Take One Tablet By Mouth Daily 2)  Vytorin 10-40 Mg Tabs (Ezetimibe-Simvastatin) .... Take One Tablet By Mouth Dailyat Bedtime 3)  Fish Oil   Oil (Fish Oil) .Marland Kitchen.. 1 Tab By Mouth Once Daily 4)  Lisinopril 10 Mg Tabs (Lisinopril) .... One Half Tab By Mouth Once Daily 5)  Metformin Hcl 500 Mg Tabs (Metformin Hcl) .Marland Kitchen.. 1 Tab Two Times A Day 6)  Nexium 40 Mg Cpdr (Esomeprazole Magnesium) .Marland Kitchen.. 1 Tab By Mouth Once Daily 7)  Flomax 0.4 Mg Caps (Tamsulosin Hcl) .Marland Kitchen.. 1 Tab By  Mouth Once Daily 8)  Seroquel 25 Mg Tabs (Quetiapine Fumarate) .... One By Mouth At Bedtime 9)  Prozac 20 Mg Caps (Fluoxetine Hcl) .Marland Kitchen.. 1 By Mouth Daily 10)  Xanax 0.5 Mg Tabs (Alprazolam) .... 2-4 By Mouth At Bedtime As Needed 11)  Lyrica 75 Mg Caps (Pregabalin) .Marland Kitchen.. 1 Tab By Mouth Once Daily 12)  Oxycodone-Acetaminophen 5-325 Mg Tabs (Oxycodone-Acetaminophen) .Marland Kitchen.. 1 By Mouth 3-4 Times Daily 13)  Zolpidem Tartrate 10 Mg Tabs (Zolpidem Tartrate) .... One By Mouth At Bedtime  Allergies (verified): 1)  ! Codeine  Past History:  Past Medical History: Last updated: 11/20/2009 Hypertension CAD      - Stents 99', 05', patent circ and IM stents cath 2007 myovue 10/08 non-ischemic Hyperlipidemia Insomnia Depression Restless leg syndrome      - Responded to iron supplementation OSA      - AHI 5 from home sleep test 10/05/09 GERD Diabetes mellitus Peripheral neuropathy Chronic back pain Prostatism  Past Surgical History: Last updated: 10/02/2009 Release of right long finger A1 pulley with debridement of tenosynovitis.  L4-5 epidural steroid injection with fluoroscopic guidance. He had 95% lesion in the AV circumflex that was dilated and stented with an AVE stent. He also a 60% lesion in the OM-I and minor disease in the LAD and RCA 02/14/98  Physican Dr. Graceann Congress M.D. cc Dr Antoine Poche M.D.  Family History: Last updated: 05/01/2009  Positive for heart disease in the father, hypertension in  the mother, and diabetes in the father.  Social History: Last updated: 05/01/2009  Patient is married.  He is a Psychologist, sport and exercise.  He has no  intake of alcohol or tobacco products.  Does chew Nicorette Gum.  He has no  children.  Caregiver after surgery will be his spouse.  Review of Systems       Denies fever, malais, weight loss, blurry vision, decreased visual acuity, cough, sputumhemoptysis, pleuritic pain, palpitaitons, heartburn, abdominal pain, melena, lower extremity edema,  claudication, or rash.   Vital Signs:  Patient profile:   55 year old male Height:      72 inches Weight:      182 pounds BMI:     24.77 Pulse rate:   58 / minute Pulse (ortho):   62 / minute Resp:     12 per minute BP sitting:   120 / 72  (left arm) BP standing:   110 / 70  Vitals Entered By: Kem Parkinson (January 30, 2010 9:53 AM)  Physical Exam  General:  Affect appropriate Healthy:  appears stated age HEENT: normal Neck supple with no adenopathy JVP normal no bruits no thyromegaly Lungs clear with no wheezing and good diaphragmatic motion Heart:  S1/S2 no murmur,rub, gallop or click PMI normal Abdomen: benighn, BS positve, no tenderness, no AAA no bruit.  No HSM or HJR Distal pulses intact with no bruits No edema Neuro non-focal Skin warm and dry    Impression & Recommendations:  Problem # 1:  DIZZINESS (ICD-780.4) Not postural on exam.  CT to R/O mass lesion in setting ? vertigo or migraine.  F/U primary.  Decrease lisinopril to 5 mg  Needs to simplify centrally acting drugs.  has had panic attacks in past Orders: CT Scan  (CT Scan)  Problem # 2:  OBSTRUCTIVE SLEEP APNEA (ICD-327.23) SOB non cardiac CXR today check D-dimer and BNP.  F/U pulmonary  Problem # 3:  HYPERTENSION (ICD-401.9) Well controlled no postural signs His updated medication list for this problem includes:    Lisinopril 10 Mg Tabs (Lisinopril) ..... One half tab by mouth once daily  Problem # 4:  CAD (ICD-414.00) Stabel with no angina His updated medication list for this problem includes:    Plavix 75 Mg Tabs (Clopidogrel bisulfate) .Marland Kitchen... Take one tablet by mouth daily    Lisinopril 10 Mg Tabs (Lisinopril) ..... One half tab by mouth once daily  Problem # 5:  DM (ICD-250.00) No evidence of hypoglycemic episodes.  Neuropathy likely due to DM.  Consider neurontin in future His updated medication list for this problem includes:    Lisinopril 10 Mg Tabs (Lisinopril) ..... One half tab  by mouth once daily    Metformin Hcl 500 Mg Tabs (Metformin hcl) .Marland Kitchen... 1 tab two times a day  Other Orders: Echocardiogram (Echo) TLB-BMP (Basic Metabolic Panel-BMET) (80048-METABOL) TLB-CBC Platelet - w/Differential (85025-CBCD) T-D-Dimer Fibrin Derivatives Quantitive (10272-53664) TLB-Hepatic/Liver Function Pnl (80076-HEPATIC) T-2 View CXR (71020TC)  Patient Instructions: 1)  Your physician recommends that you schedule a follow-up appointment in: 6 MONTHS 2)  Your physician has recommended you make the following change in your medication: DECREASE LISINIPRIL 10MG  TAKE ONE HALF TABLET ONCE DAILY 3)  You have been referred to DR SOOD TO F/U  SOB 4)  Non-Cardiac CT scanning, (CAT scanning), is a noninvasive, special x-ray that produces  cross-sectional images of the body using x-rays and a computer. CT scans help physicians diagnose and treat medical conditions. For some CT exams, a contrast material is used to enhance visibility in the area of the body being studied. CT scans provide greater clarity and reveal more details than regular x-ray exams. OF BRAIN WITH CONTRAST 5)  Your physician has requested that you have an echocardiogram.  Echocardiography is a painless test that uses sound waves to create images of your heart. It provides your doctor with information about the size and shape of your heart and how well your heart's chambers and valves are working.  This procedure takes approximately one hour. There are no restrictions for this procedure.   EKG Report  Procedure date:  01/30/2010  Findings:      SB 58 Otherwise normal ECG

## 2010-07-03 NOTE — Assessment & Plan Note (Signed)
Summary: 6 WEEKS/ MBW   Visit Type:  Follow-up Copy to:  Burna Sis Primary Provider/Referring Provider:  Lupita Raider  CC:  Follow-up for apnea link. Patient says he needs a refill sent to [facility] for the Seroquel.Marland Kitchen  History of Present Illness: 55 yo male with insomnia, RLS, depression, and OSA.  He had pnealink from 10/05/09, and this showed an AHI 5.  He has been sleeping some better.  He takes [place] and seroquel at 10pm, and can fall asleep in 15 min.  He is waking up with back pain, and trouble with his breathing when he is on his back.  He gets out of bed at 730am.  He had an epidural for his back recently.  His leg symptoms are much improved.  His mood has improved.    Current Medications (verified): 1)  Plavix 75 Mg Tabs (Clopidogrel Bisulfate) .... Take One Tablet By Mouth Daily 2)  Vytorin 10-40 Mg Tabs (Ezetimibe-Simvastatin) .... Take One Tablet By Mouth Dailyat Bedtime 3)  Fish Oil   Oil (Fish Oil) .Marland Kitchen.. 1 Tab By Mouth Once Daily 4)  Lisinopril 10 Mg Tabs (Lisinopril) .Marland Kitchen.. 1 Tab By Mouth Once Daily 5)  Metformin Hcl 500 Mg Tabs (Metformin Hcl) .Marland Kitchen.. 1 Tab Two Times A Day 6)  Nexium 40 Mg Cpdr (Esomeprazole Magnesium) .Marland Kitchen.. 1 Tab By Mouth Once Daily 7)  Flomax 0.4 Mg Caps (Tamsulosin Hcl) .Marland Kitchen.. 1 Tab By Mouth Once Daily 8)  Seroquel 25 Mg Tabs (Quetiapine Fumarate) .... One By Mouth At Bedtime 9)  Prozac 20 Mg Caps (Fluoxetine Hcl) .Marland Kitchen.. 1 By Mouth Daily 10)  Xanax 0.5 Mg Tabs (Alprazolam) .... 2-4 By Mouth At Bedtime As Needed 11)  Lyrica 75 Mg Caps (Pregabalin) .Marland Kitchen.. 1 Tab By Mouth Once Daily 12)  Oxycodone-Acetaminophen 5-325 Mg Tabs (Oxycodone-Acetaminophen) .Marland Kitchen.. 1 By Mouth 3-4 Times Daily 13)  Iron 325 (65 Fe) Mg Tabs (Ferrous Sulfate) .... One By Mouth Once Daily 14)  Vitamin C 500 Mg Tabs (Ascorbic Acid) .Marland Kitchen.. 1 By Mouth Daily 15)  Zolpidem Tartrate 10 Mg Tabs (Zolpidem Tartrate) .... One By Mouth At Bedtime  Allergies (verified): 1)  ! Codeine  Past  History:  Past Medical History: Hypertension CAD      - Stents 99', 05', patent circ and IM stents cath 2007 myovue 10/08 non-ischemic Hyperlipidemia Insomnia Depression Restless leg syndrome      - Responded to iron supplementation OSA      - AHI 5 from home sleep test 10/05/09 GERD Diabetes mellitus Peripheral neuropathy Chronic back pain Prostatism  Vital Signs:  Patient profile:   55 year old male Height:      72 inches (182.88 cm) Weight:      180 pounds (81.82 kg) BMI:     24.50 O2 Sat:      98 % on Room air Temp:     98.2 degrees F (36.78 degrees C) oral Pulse rate:   70 / minute BP sitting:   110 / 68  (left arm) Cuff size:   regular  Vitals Entered By: Michel Bickers CMA (November 20, 2009 10:08 AM)  O2 Sat at Rest %:  98 O2 Flow:  Room air CC: Follow-up for apnea link. Patient says he needs a refill sent to [facility] for the Seroquel. Comments Medications reviewed. Daytime phone verified.Michel Bickers CMA  November 20, 2009 10:09 AM   Physical Exam  General:  thin.   Nose:  no deformity, discharge, inflammation, or lesions Mouth:  MP 3,  no oral lesions Neck:  no JVD.   Lungs:  clear bilaterally to auscultation and percussion Heart:  regular rate and rhythm, S1, S2 without murmurs, rubs, gallops, or clicks Extremities:  no clubbing, cyanosis, edema, or deformity noted Cervical Nodes:  no significant adenopathy   Impression & Recommendations:  Problem # 1:  INSOMNIA (ICD-780.52) He is to continue zolpidem and seroquel for now.  Once his sleep apnea is treated will then decide about whether we can start weaning off his sleep aides. CBT interventions were again stressed.  Problem # 2:  RESTLESS LEG SYNDROME (ICD-333.94) His leg symptoms have improved.  Will check his iron levels and see if he can stop iron supplementation.  Problem # 3:  OBSTRUCTIVE SLEEP APNEA (ICD-327.23) He has mild sleep apnea.  I reviewed his sleep test.  Driving precautions were discussed.  I  explained how sleep apnea can affect his health.  He does have a history of cardiovascular disease and depression.    He will check to see if he is a candidate for an oral appliance.  If not he will need CPAP set up.  He is to also try positional therapy for his sleep apnea.  Problem # 4:  LEG PAIN (ICD-729.5) He had recent epidural injection.  His back pain is likely contributing to his sleep problems.  Problem # 5:  DEPRESSION (ICD-311) This appears improved.  Complete Medication List: 1)  Plavix 75 Mg Tabs (Clopidogrel bisulfate) .... Take one tablet by mouth daily 2)  Vytorin 10-40 Mg Tabs (Ezetimibe-simvastatin) .... Take one tablet by mouth dailyat bedtime 3)  Fish Oil Oil (Fish oil) .Marland Kitchen.. 1 tab by mouth once daily 4)  Lisinopril 10 Mg Tabs (Lisinopril) .Marland Kitchen.. 1 tab by mouth once daily 5)  Metformin Hcl 500 Mg Tabs (Metformin hcl) .Marland Kitchen.. 1 tab two times a day 6)  Nexium 40 Mg Cpdr (Esomeprazole magnesium) .Marland Kitchen.. 1 tab by mouth once daily 7)  Flomax 0.4 Mg Caps (Tamsulosin hcl) .Marland Kitchen.. 1 tab by mouth once daily 8)  Seroquel 25 Mg Tabs (Quetiapine fumarate) .... One by mouth at bedtime 9)  Prozac 20 Mg Caps (Fluoxetine hcl) .Marland Kitchen.. 1 by mouth daily 10)  Xanax 0.5 Mg Tabs (Alprazolam) .... 2-4 by mouth at bedtime as needed 11)  Lyrica 75 Mg Caps (Pregabalin) .Marland Kitchen.. 1 tab by mouth once daily 12)  Oxycodone-acetaminophen 5-325 Mg Tabs (Oxycodone-acetaminophen) .Marland Kitchen.. 1 by mouth 3-4 times daily 13)  Iron 325 (65 Fe) Mg Tabs (Ferrous sulfate) .... One by mouth once daily 14)  Vitamin C 500 Mg Tabs (Ascorbic acid) .Marland Kitchen.. 1 by mouth daily 15)  Zolpidem Tartrate 10 Mg Tabs (Zolpidem tartrate) .... One by mouth at bedtime  Other Orders: Est. Patient Level IV (04540) TLB-CBC Platelet - w/Differential (85025-CBCD) TLB-IBC Pnl (Iron/FE;Transferrin) (83550-IBC) TLB-Ferritin (82728-FER) Prescription Created Electronically [phone])  Patient Instructions: 1)  Continue ambien and seroquel at bedtime 2)  Lab test  today 3)  Check with your dentist and insurance company about whether you can get an oral appliance to treat your obstructive sleep apnea 4)  Try positional therapy (sleeping on your side) for your sleep apnea 5)  Follow up in 3 months Prescriptions: SEROQUEL 25 MG TABS (QUETIAPINE FUMARATE) one by mouth at bedtime  #90 x 3   Entered and Authorized by:   Coralyn Helling MD   Signed by:   Coralyn Helling MD on 11/20/2009   Method used:   Faxed to ...       [facility] (  mail-order)             , [place]         Ph: [phone]       Fax: [phone]   RxID:   [id number]    Immunization History:  Influenza Immunization History:    Influenza:  historical (03/03/2009)

## 2010-07-03 NOTE — Procedures (Signed)
Summary: [facility]   Imported By: Sherian Rein 09/21/2009 09:01:21  _____________________________________________________________________  External Attachment:    Type:   Image     Comment:   External Document

## 2010-07-03 NOTE — Assessment & Plan Note (Signed)
Summary: rov 3 wks ///kp   Visit Type:  Follow-up Copy to:  Burna Sis Primary Provider/Referring Provider:  Lupita Raider  CC:  Insomnia follow-up. The patient says he is not sleeping as well since Ambien was stopped by Dr. Ilsa Iha, psychiatrist.  The patient c/o so, and e dizziness associated with the Seroquel.Marland Kitchen  History of Present Illness: 55 yo male with insomnia, restless legs, and possible OSA.  After his last visit his sleep pattern had initially improved.  He felt that the combination of ambien and seroquel helped.  He was then seen by Dr. Marcha Dutton with [facility].  His seroquel was stopped due to concern about how this might affect his heart function.  He also had his ambien stopped, and was increased on his dose of klonopin.  He was also changed from prozac to wellbutrin.  With these changes he felt like his sleep and his mood got much worse.  He started having panic attacks again, and was no longer able to sleep on a regular basis.  He stopped wellbutrin, and was restarted on prozac.  He also resumed ambien and seroquel.  He feels his sleep has improved some, but he has resumed his poor sleeping habits.  Specifically he is not consistently keeping a regular sleep wake schedule.  His leg symptoms have improved since starting ferrous sulfate.  He has not had his home sleep study done yet.  Current Medications (verified): 1)  Plavix 75 Mg Tabs (Clopidogrel Bisulfate) .... Take One Tablet By Mouth Daily 2)  Vytorin 10-40 Mg Tabs (Ezetimibe-Simvastatin) .... Take One Tablet By Mouth Dailyat Bedtime 3)  Fish Oil   Oil (Fish Oil) .Marland Kitchen.. 1 Tab By Mouth Once Daily 4)  Lisinopril 10 Mg Tabs (Lisinopril) .Marland Kitchen.. 1 Tab By Mouth Once Daily 5)  Metformin Hcl 500 Mg Tabs (Metformin Hcl) .Marland Kitchen.. 1 Tab Two Times A Day 6)  Nexium 40 Mg Cpdr (Esomeprazole Magnesium) .Marland Kitchen.. 1 Tab By Mouth Once Daily 7)  Flomax 0.4 Mg Caps (Tamsulosin Hcl) .Marland Kitchen.. 1 Tab By Mouth Once Daily 8)  Seroquel 50 Mg Tabs  (Quetiapine Fumarate) .... One By Mouth At Bedtime 9)  Prozac 20 Mg Caps (Fluoxetine Hcl) .Marland Kitchen.. 1 By Mouth Daily 10)  Klonopin 1 Mg Tabs (Clonazepam) .... 2 By Mouth At Bedtime 11)  Xanax 0.5 Mg Tabs (Alprazolam) .... 2-4 By Mouth At Bedtime As Needed 12)  Lyrica 75 Mg Caps (Pregabalin) .Marland Kitchen.. 1 Tab By Mouth Once Daily 13)  Oxycodone-Acetaminophen 5-325 Mg Tabs (Oxycodone-Acetaminophen) .Marland Kitchen.. 1 By Mouth 3-4 Times Daily 14)  Iron 325 (65 Fe) Mg Tabs (Ferrous Sulfate) .... 2 By Mouth Daily 15)  Vitamin C 500 Mg Tabs (Ascorbic Acid) .Marland Kitchen.. 1 By Mouth Daily  Allergies (verified): 1)  ! Codeine  Past History:  Past Medical History: Hypertension CAD      - Stents 99', 05', patent circ and IM stents cath 2007 myovue 10/08 non-ischemic Hyperlipidemia Insomnia Depression Restless leg syndrome      - Responded to iron supplementation GERD Diabetes mellitus Peripheral neuropathy Chronic back pain Prostatism  Past Surgical History: Release of right long finger A1 pulley with debridement of tenosynovitis.  L4-5 epidural steroid injection with fluoroscopic guidance. He had 95% lesion in the AV circumflex that was dilated and stented with an AVE stent. He also a 60% lesion in the OM-I and minor disease in the LAD and RCA 02/14/98 Physican Dr. Graceann Congress M.D. cc Dr Antoine Poche M.D.  Vital Signs:  Patient profile:   54  year old male Height:      72 inches (182.88 cm) Weight:      187 pounds (85 kg) BMI:     25.45 O2 Sat:      98 % on Room air Temp:     98.4 degrees F (36.89 degrees C) oral Pulse rate:   54 / minute BP sitting:   112 / 66  (right arm) Cuff size:   regular  Vitals Entered By: Michel Bickers CMA (Oct 02, 2009 3:31 PM)  O2 Sat at Rest %:  98 O2 Flow:  Room air   Impression & Recommendations:  Problem # 1:  INSOMNIA (ICD-780.52) Advised him to continue with his strict sleep wake schedule, and reviewed proper sleep hygiene.  I will restart his ambien.  The goal is to getting  him off sleep aides if possible, but he may need to have a much more gradual taper off of his sleep aides.  He seemed to also have some benefit from seroquel.  I will have him continue this for now, but use a lower dose.  Again the plan is to eventually get him off this medication also, but would need to get him set with a better sleep pattern before trying to reduce his medication.  Problem # 2:  RESTLESS LEG SYNDROME (ICD-333.94) He has improved with ferrous sulfate.  Will have him decrease his dose to one pill once daily.  Problem # 3:  OBSTRUCTIVE SLEEP APNEA (ICD-327.23) Will re-schedule his home sleep test.  Problem # 4:  DEPRESSION (ICD-311) He is to f/u with psychiatry to have further discussion about anger management, possible PTSD, and whether he needs to have further adjustments to his mood stablizing medications.  Medications Added to Medication List This Visit: 1)  Seroquel 25 Mg Tabs (Quetiapine fumarate) .... One by mouth at bedtime 2)  Prozac 20 Mg Caps (Fluoxetine hcl) .Marland Kitchen.. 1 by mouth daily 3)  Oxycodone-acetaminophen 5-325 Mg Tabs (Oxycodone-acetaminophen) .Marland Kitchen.. 1 by mouth 3-4 times daily 4)  Iron 325 (65 Fe) Mg Tabs (Ferrous sulfate) .... One by mouth once daily 5)  Vitamin C 500 Mg Tabs (Ascorbic acid) .Marland Kitchen.. 1 by mouth daily 6)  Zolpidem Tartrate 10 Mg Tabs (Zolpidem tartrate) .... One by mouth at bedtime  Complete Medication List: 1)  Plavix 75 Mg Tabs (Clopidogrel bisulfate) .... Take one tablet by mouth daily 2)  Vytorin 10-40 Mg Tabs (Ezetimibe-simvastatin) .... Take one tablet by mouth dailyat bedtime 3)  Fish Oil Oil (Fish oil) .Marland Kitchen.. 1 tab by mouth once daily 4)  Lisinopril 10 Mg Tabs (Lisinopril) .Marland Kitchen.. 1 tab by mouth once daily 5)  Metformin Hcl 500 Mg Tabs (Metformin hcl) .Marland Kitchen.. 1 tab two times a day 6)  Nexium 40 Mg Cpdr (Esomeprazole magnesium) .Marland Kitchen.. 1 tab by mouth once daily 7)  Flomax 0.4 Mg Caps (Tamsulosin hcl) .Marland Kitchen.. 1 tab by mouth once daily 8)  Seroquel 25 Mg  Tabs (Quetiapine fumarate) .... One by mouth at bedtime 9)  Prozac 20 Mg Caps (Fluoxetine hcl) .Marland Kitchen.. 1 by mouth daily 10)  Klonopin 1 Mg Tabs (Clonazepam) .... 2 by mouth at bedtime 11)  Xanax 0.5 Mg Tabs (Alprazolam) .... 2-4 by mouth at bedtime as needed 12)  Lyrica 75 Mg Caps (Pregabalin) .Marland Kitchen.. 1 tab by mouth once daily 13)  Oxycodone-acetaminophen 5-325 Mg Tabs (Oxycodone-acetaminophen) .Marland Kitchen.. 1 by mouth 3-4 times daily 14)  Iron 325 (65 Fe) Mg Tabs (Ferrous sulfate) .... One by mouth once daily 15)  Vitamin  C 500 Mg Tabs (Ascorbic acid) .Marland Kitchen.. 1 by mouth daily 16)  Zolpidem Tartrate 10 Mg Tabs (Zolpidem tartrate) .... One by mouth at bedtime  Other Orders: Est. Patient Level III (63016)  Patient Instructions: 1)  Ambien 10 mg at bedtime  2)  Seroquel 25 mg at bedtime  3)  Will reschedule home sleep test 4)  Ferrous sulfate one pill once daily  5)  Vitamin C one pill once daily  6)  Follow up in 6 to 8 weeks Prescriptions: SEROQUEL 25 MG TABS (QUETIAPINE FUMARATE) one by mouth at bedtime  #30 x 3   Entered and Authorized by:   Coralyn Helling MD   Signed by:   Coralyn Helling MD on 10/02/2009   Method used:   Print then Give to Patient   RxID:   [id number] ZOLPIDEM TARTRATE 10 MG TABS (ZOLPIDEM TARTRATE) one by mouth at bedtime  #30 x 3   Entered and Authorized by:   Coralyn Helling MD   Signed by:   Coralyn Helling MD on 10/02/2009   Method used:   Print then Give to Patient   RxID:   [id number]

## 2010-07-03 NOTE — Progress Notes (Signed)
Summary: refills/ change request  Phone Note Call from Patient Call back at Home Phone [phone]   Caller: Patient Call For: sood Summary of Call: pt's refill of AMBIAN has run out. he needs a 90 day supply called in to [facility]. pt also requests a refill of SEROQUEL- he wants to go back to 50 mg tabs since he has been taking  two 25mg  tabs at a time. call pt to confirm.  Initial call taken by: Tivis Ringer, CNA,  January 15, 2010 3:08 PM  Follow-up for Phone Call        Spoke with pt-states he has been taking seroquel 2 at bedtime since June; our list states 1 by mouth at bedtime-will need to get approval from [facility] to approve this as well as Ambien request.Pt is aware that [facility] is out of office til am.Katie [facility] CMA  January 15, 2010 4:06 PM   Additional Follow-up for Phone Call Additional follow up Details #1::        okay to renew zolpidem 10 mg at bedtime, dispense 90, no refills. okay to prescribe seroquel 50 mg at bedtime, dispense 90, no refills. Please make sure Mr. Esau is scheduled for ROV to discuss further in September 2011. Additional Follow-up by: Coralyn Helling MD,  January 16, 2010 8:21 AM    Additional Follow-up for Phone Call Additional follow up Details #2::    Pt aware of 9-19-11appt with [facility] and aware we are sending one time of 90 day supply to [facility] CMA  January 16, 2010 9:05 AM   Prescriptions: ZOLPIDEM TARTRATE 10 MG TABS (ZOLPIDEM TARTRATE) one by mouth at bedtime  #90 x 0   Entered by:   Reynaldo Minium CMA   Authorized by:   Coralyn Helling MD   Signed by:   Reynaldo Minium CMA on 01/16/2010   Method used:   Print then Give to Patient   RxID:   [id number] SEROQUEL 25 MG TABS (QUETIAPINE FUMARATE) one by mouth at bedtime  #90 x 0   Entered by:   Reynaldo Minium CMA   Authorized by:   Coralyn Helling MD   Signed by:   Reynaldo Minium CMA on 01/16/2010   Method used:   Print then Give to Patient   RxID:   [id number]

## 2010-07-05 NOTE — Progress Notes (Signed)
   Faxed Lov,12 lead over to [facility]  May 24, 2010 10:00 AM

## 2010-07-24 ENCOUNTER — Ambulatory Visit: Payer: Self-pay | Admitting: Cardiovascular Disease

## 2010-07-27 ENCOUNTER — Ambulatory Visit (INDEPENDENT_AMBULATORY_CARE_PROVIDER_SITE_OTHER): Payer: BC Managed Care – PPO | Admitting: Cardiovascular Disease

## 2010-07-27 ENCOUNTER — Encounter: Payer: Self-pay | Admitting: Cardiovascular Disease

## 2010-07-27 DIAGNOSIS — I2510 Atherosclerotic heart disease of native coronary artery without angina pectoris: Secondary | ICD-10-CM

## 2010-07-27 DIAGNOSIS — G4700 Insomnia, unspecified: Secondary | ICD-10-CM

## 2010-07-27 DIAGNOSIS — E785 Hyperlipidemia, unspecified: Secondary | ICD-10-CM

## 2010-07-31 NOTE — Assessment & Plan Note (Signed)
Summary: 6 [facility] /SL/JT appt confirm w.pt wife=mj  Medications Added GABAPENTIN 300 MG CAPS (GABAPENTIN) 2 tabs by mouth two times a day * PROCHLORPERAZINE as needed ALLEGRA ALLERGY 180 MG TABS (FEXOFENADINE HCL) 1 tab by mouth once daily * STERIOD PACK as directed * VELCADEO...(CHEMO) as directed shot in stomach NITROLINGUAL 0.4 MG/SPRAY SOLN (NITROGLYCERIN) One spray under tongue every 5 minutes as needed for chest pain---may repeat times three HALCION 0.25 MG TABS (TRIAZOLAM) two tablets by mouth at bedtime      Allergies Added:   Referring Provider:  Burna Sis, Charlton Haws Primary Provider:  Lupita Raider  CC:  dizziness.  History of Present Illness: [name] [name] is seen today for F/U of CAD, HTN elevated lipids and dyspnea.  He has a history of stenting of the circ and IM.  He had a non-ischemic myovue in 2008.  He is less active and not doing much lasndscaping    He has been compliant with his meds.  He has not had a CXR this year  He sees Dr Craige Cotta for apnea.   Recent diagnosis of myeloma and getting a 6 month course of [place].  No SSCP mostly fatigue and less SOB.    Current Problems (verified): 1)  Multiple Myeloma  (ICD-203.00) 2)  Obstructive Sleep Apnea  (ICD-327.23) 3)  Dyspnea  (ICD-786.05) 4)  Hypertension  (ICD-401.9) 5)  Cad  (ICD-414.00) 6)  Insomnia  (ICD-780.52) 7)  Depression  (ICD-311) 8)  Gerd  (ICD-530.81) 9)  Dm  (ICD-250.00)  Current Medications (verified): 1)  Plavix 75 Mg Tabs (Clopidogrel Bisulfate) .... Take One Tablet By Mouth Daily 2)  Vytorin 10-40 Mg Tabs (Ezetimibe-Simvastatin) .... Take One Tablet By Mouth Dailyat Bedtime 3)  Fish Oil   Oil (Fish Oil) .Marland Kitchen.. 1 Tab By Mouth Once Daily 4)  Metformin Hcl 500 Mg Tabs (Metformin Hcl) .Marland Kitchen.. 1 Tab Two Times A Day 5)  Nexium 40 Mg Cpdr (Esomeprazole Magnesium) .Marland Kitchen.. 1 Tab By Mouth Once Daily 6)  Flomax 0.4 Mg Caps (Tamsulosin Hcl) .Marland Kitchen.. 1 Tab By Mouth Once Daily 7)  Prozac 20 Mg Caps  (Fluoxetine Hcl) .Marland Kitchen.. 1 By Mouth Daily 8)  Xanax 0.5 Mg Tabs (Alprazolam) .Marland Kitchen.. 1 Once Daily As Needed 9)  Oxycodone-Acetaminophen 5-325 Mg Tabs (Oxycodone-Acetaminophen) .Marland Kitchen.. 1 By Mouth 3-4 Times Daily 10)  Zolpidem Tartrate 10 Mg Tabs (Zolpidem Tartrate) .... One By Mouth At Bedtime 11)  Gabapentin 300 Mg Caps (Gabapentin) .... 2 Tabs By Mouth Two Times A Day 12)  Chemotherapy Treatment .... Once Weekly For 6 Months;              Dr. Jeanie Sewer 13)  Prochlorperazine .... As Needed 14)  Allegra Allergy 180 Mg Tabs (Fexofenadine Hcl) .Marland Kitchen.. 1 Tab By Mouth Once Daily 15)  Steriod Pack .... As Directed 16)  Velcadeo.Marland Kitchen.(Chemo) .... As Directed Shot in Stomach 17)  Nitrolingual 0.4 Mg/spray Soln (Nitroglycerin) .... One Spray Under Tongue Every 5 Minutes As Needed For Chest Pain---May Repeat Times Three 18)  Halcion 0.25 Mg Tabs (Triazolam) .... Two Tablets By Mouth At Bedtime  Allergies (verified): 1)  ! Codeine  Past History:  Past Medical History: Last updated: 05/09/2010 Hypertension CAD      - Stents 99', 05', patent circ and IM stents cath 2007 myovue 10/08 non-ischemic Hyperlipidemia Insomnia Depression Restless leg syndrome      - Responded to iron supplementation OSA      - AHI 5 from home sleep test 10/05/09 GERD Diabetes  mellitus Peripheral neuropathy Chronic back pain Prostatism Multiple myeloma dx Nov. 2011      - Followed by Dr. Jeanie Sewer in [facility]  Past Surgical History: Last updated: 10/02/2009 Release of right long finger A1 pulley with debridement of tenosynovitis.  L4-5 epidural steroid injection with fluoroscopic guidance. He had 95% lesion in the AV circumflex that was dilated and stented with an AVE stent. He also a 60% lesion in the OM-I and minor disease in the LAD and RCA 02/14/98 Physican Dr. Graceann Congress M.D. cc Dr Antoine Poche M.D.  Family History: Last updated: 05/01/2009  Positive for heart disease in the father, hypertension in  the mother, and  diabetes in the father.  Social History: Last updated: 05/01/2009  Patient is married.  He is a Psychologist, sport and exercise.  He has no  intake of alcohol or tobacco products.  Does chew Nicorette Gum.  He has no  children.  Caregiver after surgery will be his spouse.  Review of Systems       Denies fever, malais, weight loss, blurry vision, decreased visual acuity, cough, sputum, SOB, hemoptysis, pleuritic pain, palpitaitons, heartburn, abdominal pain, melena, lower extremity edema, claudication, or rash.   Vital Signs:  Patient profile:   55 year old male Height:      72 inches Weight:      183 pounds BMI:     24.91 Pulse rate:   78 / minute Resp:     12 per minute BP sitting:   127 / 80  (right arm)  Vitals Entered By: Kem Parkinson (July 27, 2010 2:18 PM)  Physical Exam  General:  Affect appropriate Healthy:  appears stated age HEENT: normal Neck supple with no adenopathy JVP normal no bruits no thyromegaly Lungs clear with no wheezing and good diaphragmatic motion Heart:  S1/S2 no murmur,rub, gallop or click PMI normal Abdomen: benighn, BS positve, no tenderness, no AAA no bruit.  No HSM or HJR Distal pulses intact with no bruits No edema Neuro non-focal Skin warm and dry    Impression & Recommendations:  Problem # 1:  MULTIPLE  MYELOMA (ICD-203.00) F/U in [place]  SPEP/UPEP and bone survey post chemo  Problem # 2:  HYPERTENSION (ICD-401.9) Well controlled The following medications were removed from the medication list:    Lisinopril 10 Mg Tabs (Lisinopril) ..... One half tab by mouth once daily  Problem # 3:  CAD (ICD-414.00) Stable no chest pain Continue antiplatlet Rx The following medications were removed from the medication list:    Lisinopril 10 Mg Tabs (Lisinopril) ..... One half tab by mouth once daily His updated medication list for this problem includes:    Plavix 75 Mg Tabs (Clopidogrel bisulfate) .Marland Kitchen... Take one tablet by mouth daily    Nitrolingual  0.4 Mg/spray Soln (Nitroglycerin) ..... One spray under tongue every 5 minutes as needed for chest pain---may repeat times three  Problem # 4:  INSOMNIA (ICD-780.52) F/U primary  On Halcion  Patient Instructions: 1)  Your physician wants you to follow-up in: 6 MONTHS  You will receive a reminder letter in the mail two months in advance. If you don't receive a letter, please call our office to schedule the follow-up appointment. Prescriptions: NITROLINGUAL 0.4 MG/SPRAY SOLN (NITROGLYCERIN) One spray under tongue every 5 minutes as needed for chest pain---may repeat times three  #1 x 12   Entered by:   Deliah Goody, RN   Authorized by:   Colon Branch, MD, [facility]   Signed by:  Deliah Goody, RN on 07/27/2010   Method used:   Electronically to        [facility]  [place] [address] (retail)       [address]       [place], [place]  [address]       Ph: [phone] or [phone]       Fax: [phone]   RxID:   [id number]

## 2010-10-16 NOTE — Assessment & Plan Note (Signed)
[facility]                            CARDIOLOGY OFFICE NOTE   NAME:[name] [name], [name] [name]                      MRN:          [id number]  DATE:09/24/2007                            DOB:          03-08-1956    Keelyn is seen today followup.  He has a distant non-Q-wave MI, with  stents to the OM and intermediate branch.  He had a Myoview October 2008  to clear him for back surgery.  This was nonischemic with a good EF.  He  had his back surgery which was successful by Dr. Tera Mater.  He seems to  have improvement.  He subsequently had a right hand surgery by Dr.  Teressa Senter for a Dupuytren's contracture.  He had some unusual inflammation  and was on prednisone.  This has torn his stomach up some, and he is  taking a lot of Nexium.  Outside of this, he had been doing well.  He is  not having any significant chest pain, palpitations, syncope, PND, or  orthopnea.  He has had two occasions of shortness of breath in the last  couple months, which are not explained.  They were short lived, 3 to 4  minutes, but he felt fairly profound shortness of breath.  There was  nothing he could do to make them go away.  There was no cough or audible  wheezing with  it.  I told him I could also not explain this, since he  does not have valvular disease or decreased LV function.   REVIEW OF SYSTEMS:  Overall, however, I think his heart is stable.  His  review of systems is otherwise negative.   CURRENT MEDICATIONS:  His current meds include:  1. Ambien 10 mg a day.  2. Prozac 40 a day.  3. Nexium 40 a day.  4. Nicorette gum.  5. Vytorin 1040.  6. Plavix 75 a day.  7. Flomax 0.4 a day.   EXAM:  GENERAL:  Is remarkable for healthy-appearing elderly white male  in no distress.  VITAL SIGNS:  His weight is 187, blood pressure is 122/76, pulse 73 and  regular, respiratory rate 14, afebrile.  HEENT:  Unremarkable.  Carotids normal without bruit, no  lymphadenopathy, thyromegaly, JVP  elevation.  EXTREMITIES:  He as a lumbar spine scar from Dr. Tera Mater.  HEART:  There is an S1-S2, normal heart sounds, PMI normal.  ABDOMEN:  Benign.  Bowel sounds positive.  He does have a bit of  epigastric pain to palpation.  There is no bruit, no hepatosplenomegaly.  No hepatojugular reflux.  Distal pulses are intact, no edema.  NEURO:  Nonfocal.  SKIN:  Warm and dry.  No muscular weakness.  She is status post surgery  to the right hand.   IMPRESSION:  1. Coronary disease, non drug-eluting stents to the obtuse marginal      and intermediate branch stable.  Follow-up Myoview October 2010.  2. Smoking.  Continues to abstain.  Continue Nicorette gum.  3. Hyperlipidemia.  Continue Vytorin 1040, lipid liver profile in 6      months.  4.  He may need to be seen by Dr. Arvilla Market for his epigastric pain.  He      is taking Nexium.  I urged him to watch his stools and possibly to      add Tums or some other antacid.  I suspect his stomach or gastric      pain will get better, when he is off his prednisone.  5. Recent hand surgery, follow-up physical therapy and occupational      therapy with Dr. Margaree Mackintosh.  6. History of prostatism.  Continue Flomax 0.4 day.  Consider adding      Avodart in the future.   Overall, I think Anothy is doing well.  He will continue take aspirin  and Plavix for his coronary disease.     [name], MD, [facility]  Electronically Signed    PCN/MedQ  DD: 09/24/2007  DT: 09/24/2007  Job #: [id number]

## 2010-10-16 NOTE — Assessment & Plan Note (Signed)
[facility]                            CARDIOLOGY OFFICE NOTE   NAME:[name] [name], [name] PARAS                      MRN:          [id number]  DATE:03/25/2007                            DOB:          November 06, 1955    [name] returns today for follow-up.  He is status post distant history  of non-Q-wave MI with stent placements in 1999 and 2005.   His last Myoview was on January 29, 2006, which was nonischemic with an  EF of 60%.   The patient's last heart catheterization was in September 2007.   At that time he had a patent stent in the left circumflex with a patent  stent in the ramus intermedius branch with LV function.  Continued  medical therapy was warranted.  The patient since I last saw him has had  back surgery by Dr. Noel Gerold, which has been marginally successful.  He  continues to have back pain.  He has continued right hip pain, which is  requiring injections.  Apparently Dr. Noel Gerold is insisting that he stop  his Plavix prior to this.  This does not appear to be standard practice,  at least from other referring orthopedic doctors.  He also needs to have  hand surgery in his right hand by Dr. Teressa Senter.  I think it is reasonable  to withhold Plavix 4-5 days prior to this surgery, and apparently he has  some calcific nodules that are impinging on his tendons.  Aside from all  of his orthopedic problems, he is doing fairly well.  He has not had any  significant chest pain, PND or orthopnea.  He does have exertional  dyspnea and probable COPD.  He quit smoking 3 years ago but continues to  chew Nicorette gum.  He was supposed to have PFTs last April and they  never got done.  I would like to do these pre and post bronchodilator as  well as a stress prior to any further surgery.   His review of systems is otherwise unremarkable.   CURRENT MEDICATIONS:  1. Ambien 10 mg a day.  2. Prozac 40 mg a day.  3. Nexium 40 mg a day.  4. Nicorette Gum.  5. Vytorin 10/40  mg.  6. Plavix 75 mg a day.  7. Lyrica.  8. Baby aspirin.   Exam is remarkable for a depressed, middle-aged white male in minor  distress from right hip pain.  Weight is 182.  Blood pressure is 110/80, pulse 80 and regular,  respiratory rate 14, afebrile.  HEENT:  Unremarkable.  Carotids normal without bruit.  No lymphadenopathy.  No thyromegaly.  No  JVP elevation.  LUNGS:  Clear with good diaphragmatic motion.  No wheezing.  There is an S1-S2, with normal heart sounds.  PMI is normal.  ABDOMEN:  Benign.  Bowel sounds positive.  No tenderness.  No  hepatosplenomegaly or hepatojugular reflux.  No AAA or bruits.  Femorals are +3 bilaterally without bruit.  PTs are +3.  No lower  extremity edema.  NEUROLOGIC:  Nonfocal.  SKIN:  Warm and dry.  No muscular weakness.  IMPRESSION:  1. Coronary artery disease.  Previous non-drug-eluting stents to the      ramus and obtuse marginal, patent by catheterization in 2007,      nonischemic Myoview in August 2007.  Follow up adenosine to clear      him for further surgery next week.  2. Previous smoking with continued Nicorette gum use.  3. Exertional dyspnea with probable chronic obstructive pulmonary      disease.  Pulmonary function tests pre and post bronchodilator with      DLCO next week.  4. History of hypercholesterolemia with coronary disease.  Continue      Vytorin 10/40 mg.  He said he has had blood work with his general      physical this year.  We will try to get those results from his      primary care MD.  5. History of depression, currently somewhat depressed by all of his      orthopedic problems.  Continue Prozac 40 mg a day.  6. Insomnia, likely related to poor exercise habits and depression.      Continue Ambien 10 mg q.h.s. per primary care MD.  7. In regard to his orthopedic procedures, I do not mind him being off      Plavix for 4-5 days since he has non-drug-eluting stents and did      fine being off Plavix for a  longer period of time with his back      surgery; however, in general he has not had to stop Plavix prior to      hip injections.     [name], MD, [facility]  Electronically Signed    PCN/MedQ  DD: 03/25/2007  DT: 03/26/2007  Job #: [id number]   cc:   Sharolyn Douglas, M.D.  Katy Fitch Sypher, M.D.

## 2010-10-16 NOTE — Assessment & Plan Note (Signed)
[facility]                            CARDIOLOGY OFFICE NOTE   NAME:[name] [name], [name] [name]                      MRN:          [id number]  DATE:08/16/2008                            DOB:          [birth year]    Mr. Smisek is seen today in followup.  He has coronary artery disease,  which has been stable.  He has had a previous non-Q-wave MI with stents  to the OM and intermediate branch.  He had a Myoview in October 2008 to  clear him for back surgery.  It was nonischemic with a good EF.  He  subsequently had right hand surgery for Dupuytren contracture, which is  healed well.   He denies any significant chest pain.  He probably does not need another  Myoview until October 2010.   He seems a little bit depressed.   He has been having pain in his legs.  He thinks it is from the Vytorin.  He has cut this in half with some relief.  I told him we could stop it  totally.  He has not had claudication or DVT in the past.  His pain is  not necessarily exertional, but at rest as well.  We will check his ABIs  and stop his Vytorin totally.  If his pain improves further, I may try  him on Crestor 5 mg every other day since he does have vascular disease.   Otherwise, he has not been paying close attention to his diabetes.  He  is on metformin.  His hemoglobin A1c, I believe, was above 7.  We talked  about low-carbohydrate diet and the need to monitor his sugar on  occasion.   His epigastric pain continues.  He has seen Dr. Ewing Schlein in the past.  He  continues with H2 blockers.   His review of systems otherwise negative.   MEDICATIONS:  1. Lyrica 150 a day.  2. Vytorin 10/80 to be stopped.  3. Oxycodone.  4. Metformin 500 b.i.d.  5. Lisinopril 10 a day.  6. Aspirin a day.  7. Plavix 75 a day.  8. Nexium 40 a day.   PHYSICAL EXAMINATION:  GENERAL:  Somewhat subdued affect.  VITAL SIGNS:  Blood pressure 130/70, pulse 70 and regular, respiratory  rate 14, and  afebrile.  HEENT:  Unremarkable.  NECK:  Carotids are normal without bruit.  No lymphadenopathy without  any JVP elevation.  LUNGS:  Clear with good diaphragmatic motion.  No wheezing.  CARDIAC:  S1 and S2.  Normal heart sounds.  PMI normal.  ABDOMEN:  Mild epigastric pain.  No AAA, no tenderness, no bruit.  No  hepatosplenomegaly or hepatojugular reflux.  No tenderness.  EXTREMITIES:  Femorals are +2 bilaterally.  Popliteals are +2  bilaterally.  DPs are +2 bilaterally.  No bruits.  NEUROLOGIC:  Nonfocal.  SKIN:  Warm and dry.  MUSCULOSKELETAL:  No muscular weakness.   IMPRESSION:  1. Coronary artery disease, not having chest pain.  Continue aspirin      and Plavix.  Follow up Myoview, consider in October 2010.  2. Leg pain.  Check ABIs.  The patient is more likely having side      effects from his statin therapy.  Vytorin to be stopped.  He will      call us in about 4 weeks if his symptoms have resolved.  I would      start Crestor 5 mg every other day.  3. Hypertension, currently well controlled.  Continue current dose of      lisinopril.  4. Diabetes, poorly controlled.  Increase diet therapy and consider      changing over to Janumet.  Target hemoglobin A1c less than 6.5.  5. Gastroesophageal reflux disease and reflux.  Follow up Dr. Ewing Schlein.      Consider switching from Nexium to Protonix.  I think in general he      should avoid omeprazole anyway given his Plavix therapy.     [name], MD, [facility]  Electronically Signed    PCN/MedQ  DD: 08/16/2008  DT: 08/17/2008  Job #: [id number]

## 2010-10-16 NOTE — Op Note (Signed)
NAMEVILAS, EDGERLY NO.:  [id number]   MEDICAL RECORD NO.:  [id number]          PATIENT TYPE:  AMB   LOCATION:  [facility]                          FACILITY:  [facility]   PHYSICIAN:  Katy Fitch. Sypher, M.D. DATE OF BIRTH:  01/05/1956   DATE OF PROCEDURE:  04/21/2007  DATE OF DISCHARGE:                               OPERATIVE REPORT   PREOPERATIVE DIAGNOSES:  Stenosing tenosynovitis, right index, long and  ring fingers.   POSTOPERATIVE DIAGNOSES:  Stenosing tenosynovitis, right index, long and  ring fingers.   OPERATION:  1. Release of right long finger A1 pulley with debridement of      tenosynovitis.  2. Release of right ring finger A1 pulley.  3. Injection of right index flexor sheath at A1 pulley.   OPERATING SURGEON:  Katy Fitch. Sypher, M.D.   ASSISTANT:  Jonni Sanger, P.A.   ANESTHESIA:  General sedation and 2% lidocaine metacarpal head level  block of index, long and ring fingers.   SUPERVISING ANESTHESIOLOGIST:  Quita Skye. Krista Blue, M.D.   INDICATIONS:  [name] [name] is a 55 year old gentleman referred  for evaluation and management of stenosing tenosynovitis of his right  index, long and ring fingers.   He has a complex past cardiac history.  He has been cleared by Theron Arista C.  Eden Emms, MD, his cardiologist for surgery at this time.   Preoperatively we advised him to proceed with release of his long and  ring finger A1 pulleys and possible release of the A1 pulley of the  index finger.   A detailed examination in  holding area revealed that he had active  locking of the long and ring fingers and minimal crepitation of the  index finger.   We advised to proceed with an injection of the index finger flexor  sheath as an alternative to A1 pulley release at this time.   PROCEDURE:  Egor Fullilove was brought to the operating room and  placed in supine position on the operating room table.   Following light sedation the right arm was  prepped with Betadine soap  solution, sterilely draped.  A pneumatic tourniquet was applied to the  proximal right brachium.   Following exsanguination of right arm with Esmarch bandage arterial  tourniquet was inflated to 220 mmHg.   Procedure commenced with injection of the right index finger flexor  sheath with a maneuver of finger flexion, penetration of the A1 pulley  with a 27 gauge needle and injection of 50:50 mixture of Depo-Medrol 40  mg/mL and 2% plain lidocaine for total volume of 1.5 mL.  Good sheath  distention was achieved.   Attention then directed to the right long and ring fingers.   An incision was fashioned in the distal palmar crease exposing the  palmar fascia  overlying the long and ring fingers.   The pre-tendinous fibers of the palmar fascia was released to the long  finger and the ring finger followed by careful isolation of the flexor  tendon sheaths.  The A1 pulleys were cleared of all inflammatory tissue  and sequentially the long and  ring finger A1 pulleys were released along  the radial border with scissors.   Thereafter full active range of motion of the fingers was recovered.  The long finger still had some crepitation due to a thick cuff of  fibrotic tenosynovium.   This was debrided with scissors and rongeur dissection.   Thereafter full range of motion of the index, long and ring fingers was  noted.  There was no residual sign of triggering.   The wound was then repaired with mattress suture of 5-0 nylon.   A compressive dressing was applied with Xeroflo sterile gauze and a  Coban hand dressing.  There no apparent complications.      Katy Fitch Sypher, M.D.  Electronically Signed     RVS/MEDQ  D:  04/21/2007  T:  04/21/2007  Job:  [id number]

## 2010-10-19 NOTE — Consult Note (Signed)
[facility]  Patient:    [name] [name], [name] [name] Visit Number: [id number] MRN: [id number]          Service Type: PMG Location: [facility] Attending Physician:  Rolly Salter Dictated by:   Sondra Come, D.O. Proc. Date: 04/13/01 Admit Date:  02/03/2001                            Consultation Report  REASON FOR FOLLOWUP:  [name] [name] returns to clinic today for a trial of trigger point injections for upper back myofascial pain.  Patient was last seen on March 23, 2001.  He continues to have improvement of his right low back pain following lumbar facet injections.  His pain today in his upper back and neck is a 7-8/10 on a subjective scale and seems to be getting worse. Overall, however, his function and quality-of-life indices have improved and his sleep is good.  Patient denies any radicular symptoms in his upper extremities.  Denies any other neurologic problems.  I review health and history form and 14-point review of systems.  PHYSICAL EXAMINATION:  GENERAL:  Physical examination reveals a healthy male in no acute distress.  VITAL SIGNS:  Blood pressure is 130/74, pulse 75, respirations 16, O2 saturation 97% on room air.  NEUROMUSCULAR:  Manual muscle testing is 5/5, bilateral upper extremities. Sensory exam is intact to light touch, bilateral upper extremities.  Muscle stretch reflexes are intact, bilateral upper extremities.  Range of motion of the cervical spine is normal without significant tenderness.  However, rotation plus flexion does cause contralateral posterior cervicothoracic muscle tension.  Palpatory examination reveals active trigger points, bilateral trapezius and levator scapulae muscles.  IMPRESSION: 1. Myofascial upper back and neck pain. 2. 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  PLAN: 1. Trial of trigger point injections to bilateral trapezius and levator    scapulae muscles.  Risks,  benefits, limitations and alternatives were    explained.  Procedure was described.  Patient wishes to proceed with    procedure.  Informed consent was obtained.  Skin was prepped in usual    sterile fashion.  The right levator scapulae and trapezius trigger points    were injected with 1 cc of 1% lidocaine using a 25-gauge 1-1/2-inch needle.    Following these two trigger point injections, patient became vasovagal and    he was assisted into a supine position on the examination table.  Patients    vital signs remained stable and he was monitored for several minutes with    serial vital signs checks.  He was released from the clinic in good    condition with stable vital signs under the care of his wife. 2. Continue current medications. 3. Patient to return to clinic for left trapezius and levator scapulae trigger    point injections.  Patient was educated in the above findings and recommendations and understands.  There were no barriers to communication. Dictated by:   Sondra Come, D.O. Attending Physician:  Rolly Salter DD:  04/13/01 TD:  04/14/01 Job: [id number]

## 2010-10-19 NOTE — Op Note (Signed)
NAMEBLAIDEN, WERTH NO.:  [id number]   MEDICAL RECORD NO.:  [id number]          PATIENT TYPE:  INP   LOCATION:  5035                         FACILITY:  [facility]   PHYSICIAN:  Sharolyn Douglas, M.D.        DATE OF BIRTH:  1956/05/27   DATE OF PROCEDURE:  02/20/2006  DATE OF DISCHARGE:                                 OPERATIVE REPORT   PREOPERATIVE DIAGNOSIS:  1. Lumbar degenerative disk disease with chronic back and bilateral lower      extremity pain, right greater than left.  2. Lumbar spinal stenosis.   POSTOPERATIVE DIAGNOSIS:  1. Lumbar degenerative disk disease with chronic back and bilateral lower      extremity pain, right greater than left.  2. Lumbar spinal stenosis.   PROCEDURE:  1. L4-L5 and L5-S1 laminectomy with decompression of the thecal sac and      nerve roots bilaterally.  2. L4-L5 and L5-S1 transforaminal lumbar interbody fusion with placement      of PEEK cage at L4-L5.  3. Segmental pedicle screw instrumentation L4 through S1 using the Abbott      spine system.  4. Posterior spinal fusion L4 through S1.  5. Local autogenous bone graft supplemented with 15 mL of Grafton      allograft and bone morphogenic protein.   SURGEON:  Sharolyn Douglas, M.D.   ASSISTANTJill Side Mahar, P.A.-C.   ANESTHESIA:  General endotracheal.   ESTIMATED BLOOD LOSS:  250 mL.   COMPLICATIONS:  None.   COUNTS:  Needle and sponge count correct.   INDICATIONS:  The patient is a pleasant 55 year old male with chronic  progressive back and bilateral lower extremity pain, right greater than  left.  His imaging studies show severe degenerative changes with disk space  narrowing and bone on bone contact at L5-S1.  At L4-L5, there is  degenerative changes involving primarily the facet joints with associated  lateral recess narrowing and a posterior annular tear which protrudes into  the L4-L5 foramen.  There is foraminal stenosis at L5-S1 due to narrowing of  the  interspace.  He has failed to respond to all conservative treatment  measures and now elects to undergo L4 through S1 decompression and fusion in  hopes of improving his symptoms.  The risks and benefits were reviewed.   PROCEDURE:  The patient was identified in the holding area and, after  informed consent, taken to the operating room.  He underwent general  endotracheal anesthesia without difficulty and was given prophylactic IV  antibiotics.  He was carefully turned prone onto the Wilson frame.  All bony  prominences were padded.  The face and eyes were protected at all times.  The back was prepped and draped in the usual sterile fashion.  Neural  monitoring had been established in the form of SSEPs and lower extremity  EMGs.  A midline incision was made from L3 down to the sacrum.  Dissection  was carried sharply through the deep fascia.  Subperiosteal exposure was  carried out exposing the tips of the transverse processes of L4, L5,  and the  sacral ala bilaterally.  Deep retractors were placed.  An x-ray was taken to  confirm the appropriate levels were being attended to.  We then used the  anatomic probing technique to place pedicle screws at L4, L5, and S1  bilaterally.  Each pedicle starting point was identified using anatomic  landmarks.  The pedicle holes were initiated with the awl.  The pedicle  probe was then used to cannulate the pedicles.  Each pedicle was then  palpated, both before and after tapping, and there were no breeches.  We  utilized 6.5 mm screws in L4 and L5 and 7.5 mm screws in the sacrum.  The  patient's bone was somewhat soft and the screw purchase was average.  Each  screw was stimulated using triggered EMGs and there were no deleterious  changes.   At this point, we turned our attention to completing a laminectomy at L4-L5  and L5-S1 by removing the spinous processes of L4 and L5 leaving the  superior portion of the L4 process and the interspinous ligament  between L3  and L4.  The lamina was removed on the right side.  The ligamentum flavum  was undercut decompressing the central canal.  Further decompression was  carried out laterally, decompressing the lateral recess.  The overhanging  facet joints were removed and the L4, L5 and S1 nerve roots were identified  and widely decompressed.   At this point, we turned our attention to completing the transforaminal  lumbar interbody fusion in order to enhance the fusion rate, decompress the  foramen further, and apply anterior column support to protect the posterior  instrumentation.  The remaining facet joints were osteotomized.  Starting at  L4-L5, the disk space was entered.  A radical diskectomy was completed.  The  cartilaginous endplates were scraped with curved curets.  The disk space was  irrigated and dilated up to 11 mm.  We then packed BMP sponges along with  local bone graft obtained from the laminectomy into the L4-L5 interspace and  across the midline.  An 11-mm PEEK cage was packed with a BMP sponge  inserted into the interspace, tamped anteriorly and across the midline.  We  monitored free running EMGs throughout this procedure and there were no  deleterious changes.  The exiting transversing nerve roots were identified  and protected at all times.  We then performed a similar procedure at L5-S1.  At this level, due to the severe disk space collapse, the foramen was  extremely stenotic due to impingement of the L5 and S1 pedicles.  We were  able to enter the disk space but because it was so narrow, we could not  place a PEEK cage.  Instead, after cleaning out the disk space with curets,  BMP sponges were packed into the interspace along with local bone graft from  the laminectomy.   We then turned our attention to completing the posterior fusion by  decorticating the transverse processes of L4 and L5 bilaterally and packing the remaining local bone graft tightly into the lateral  gutters.  This was  supplemented with 15 mL of Grafton allograft.  70 mm titanium rods were then  placed into the polyaxial screw heads and compression was applied across  each segment before shearing off the locking caps..  We paid special  attention to the L5-S1 level so as not to create further stenosis within the  foramen.  The nerve roots were checked again after placing the rods  and were  found to be well decompressed.  Hemostasis was achieved.  A single cross  connector was placed.  A deep Hemovac drain was placed.  The On-Q pain  catheters were placed percutaneously along the paraspinal muscles  bilaterally and bolused with 5 mL of 0.5% Marcaine.  The deep fascia was  closed with a running #1 Vicryl suture.  We left Gelfoam over the exposed  epidural space.  The subcutaneous layer was closed with 0 Vicryl and 2-0  Vicryl followed by a running 3-0 subcuticular Vicryl suture on the skin  edges.  Benzoin and Steri-Strips placed.  A sterile dressing was applied.  The patient was turned supine, extubated without difficulty, and transferred  to recovery in stable condition able to move his upper and lower  extremities.   It should be noted my assistant, [employer], P.A.-C., was present  throughout the procedure including during the positioning, the exposure, the  decompression, the instrumentation, and also the fusion.  She also assisted  with the wound closure.      Sharolyn Douglas, M.D.  Electronically Signed     MC/MEDQ  D:  02/20/2006  T:  02/23/2006  Job:  [id number]

## 2010-10-19 NOTE — Consult Note (Signed)
[facility]  Patient:    [name] [name], [name] [name] Visit Number: [id number] MRN: [id number]          Service Type: PMG Location: [facility] Attending Physician:  Sondra Come Dictated by:   Sondra Come, D.O. Proc. Date: 11/02/01 Admit Date:  09/15/2001                            Consultation Report  Mr. Amos returns to clinic today as scheduled for reevaluation.  He was last seen on Oct 05, 2001 at which time he underwent a right subacromial steroid injection which gave him significant relief for approximately 10 days with gradual return of pain to baseline.  Today his pain is an 8/10 on a subjective scale and mainly involving his right shoulder.  He denies any significant low back pain at this time.  His function and quality of life indexes remain stable.  He continues working in [employer].  He continues taking Percocet one to two q.d. and has 10-12 pills left from a prescription written on Oct 05, 2001.  He does not really like taking the pain medicine, but states that it has been helping him.  He continues having some difficulty sleeping, but continues with Ambien.  He states that he wakes up at 3-4 in the morning and cannot go back to sleep.  I reviewed the health and history form and 14 point review of systems.  I had a long discussion with Mr. Nolasco regarding further treatment options.  We also discussed the risks of repeated steroid injections.  His chart was reviewed including the time frame of his previous injections to include lumbar facet injections, cervical epidural steroid injections, elbow injection, and shoulder injection.  The patient requests repeating the shoulder injection.  We also discussed physical therapy and its importance.  PHYSICAL EXAMINATION  GENERAL:  Healthy male in no acute distress.  VITAL SIGNS:  Blood pressure 130/75, pulse 69, respirations 18, O2 saturation 98% on room air.  NEUROLOGIC:  Manual muscle  testing is 5/5 bilateral upper extremities. Sensory examination intact to light touch bilateral upper extremities at this time.  Muscle stretch reflexes are 2+/4 bilateral biceps, triceps, brachioradialis, and pronator tares.  Range of motion of the right shoulder is full in all planes with pain at less than 90 degrees of flexion and less than 90 degrees of abduction. Positive Hawkins, Neer, empty can, and OBrien test. Speeds is negative.  Anterior slide test negative.  Negative drop arm test.  IMPRESSION:  Right shoulder rotator cuff syndrome/impingement syndrome.  PLAN: 1. Again, I had a thorough discussion with Mr. Kurtzman regarding treatment    options and the risk of further steroid injections.  At this point it is    reasonable to proceed with repeat injection in the context of patient    undergoing physical therapy for longer term benefit.  We discuss this at    length.  I have written Mr. Lucarelli a prescription for physical therapy for    range of motion, strengthening, stretching, scapular stabilization, work    conditioning, and home exercise program. 2. Percocet 5/325 mg one p.o. b.i.d. #60 without refills.  Ultimate goal,    again, is to wean patient from this medicine. 3. Patient to return to clinic in one month for reevaluation.  PROCEDURE:  Right subacromial steroid injection.  Risks, benefits, limitations, and alternatives were described and explained.  Patient wished to proceed.  Informed consent was obtained.  Skin was prepped in usual sterile fashion with Betadine and alcohol.  The right subacromial space was injected with 1 cc of Kenalog 40 mg/cc plus 1 cc of 1% lidocaine plus 1 cc of 0.25% bupivacaine using a 25-gauge 1.5 inch needle without complications.  Patient tolerated the procedure well.  Discharge instructions were given.  Patient was educated on the above findings and recommendations and understands.  There were no barriers to communication. Dictated by:    Sondra Come, D.O. Attending Physician:  Sondra Come DD:  11/02/01 TD:  11/03/01 Job: [id number]

## 2010-10-19 NOTE — Assessment & Plan Note (Signed)
PATIENT:  [name] [name]   DATE OF BIRTH:  [birth year]   SURGEON:  Jewel Baize. Stevphen Rochester, M.D.   [name].  I have  reviewed the Health and history form. I reviewed the 14 point review of  systems.   1.  In the interim, [name] [name] has had to have stent placement for chest pain      and has had cardiology intervention.  It sounds like he has had a PTCA.      He had delay for his radiofrequency ablation and we had planned to go      forward today.  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  Problematic is that he is on      Plavix.  2.  He has been cleared to come off his Plavix.  I caution this, and he will      continue with his aspirin.  Will see him next week.  3.  Intolerant of Percocet.  He states that it constipates him.  He does use      it appropriately.  I am going to go ahead and trial Demerol.  It seems      to be less constipating for many individuals and will trial Skelaxin as      well.  Caution as to use of this medication.  He understands the      potential habituating nature, caution in driving, making important      cognitive decisions.   OBJECTIVE:  Diffuse paralumbar myofascial discomfort.  Pain over PSIS with  pain on extension.  Gaenslen's and Patrick's equivocal.  No new neurological  findings motor, sensory or reflexive.   IMPRESSION:  Lumbar spine facet syndrome.   PLAN:  Facet neurotomy.  Most problematic side most likely the left.  Predicate further RF to the right if needed.  Reviewed the risks,  complications and options of this procedure.  Reviewed the medications.             ___________________________________________  Celene Kras, MD    HH/MedQ  D:  02/28/2004 08:54:33  T:  02/28/2004 13:46:41  Job #:  [id number]

## 2010-10-19 NOTE — Procedures (Signed)
[facility]  Patient:    [name] [name], IIAMS Visit Number: [id number] MRN: [id number]          Service Type: PMG Location: [facility] Attending Physician:  Rolly Salter Dictated by:   Sondra Come, D.O. Proc. Date: 03/23/01 Admit Date:  02/03/2001                             Procedure Report  INDICATION:  Mr. Roots was seen this morning in clinic for persistent low back pain.  He was set up for repeat lumbar facet injections.  There was a cancellation in todays schedule so Mr. Kamara returned for the injections. Please refer to followup note dated today.  IMPRESSION: 1. Lumbar facet syndrome. 2. Degenerative disk disease of the lumbar spine.  PROCEDURE:  Right L4-5 and L5-S1 facet injections.  INFORMED CONSENT:  Procedure was described to patient.  Informed consent was obtained.  DESCRIPTION OF PROCEDURE:  Patient was brought to the fluoroscopy suite and placed on the table in prone position.  Skin was prepped and draped in the usual sterile fashion.  Skin and subcutaneous tissue were anesthetized with a total of 6 cc of 1% lidocaine using two independent needle access points. Under direct fluoroscopic guidance, a 22-gauge 3-1/2-inch spinal needle was advanced into the inferior recesses of L4-5 and L5-S1 facet joints on the right using two independent needle access points.  Needle confirmation was obtained after negative aspiration with the injection of 0.25 cc of Isovue 200-M, revealing right L4-5 and L5-S1 facet arthrograms.  Each facet joint was then injected with 0.5 cc of Aristocort 40 mg/cc, plus 0.5 cc of 1% lidocaine without complication.  Patient tolerated the procedure well.  Discharge instructions given.  DISCHARGE INSTRUCTIONS: 1. Patient was instructed to use ice tonight and tomorrow. 2. Refill prescription for Percocet 5/325 mg one p.o. b.i.d. as needed for    severe pain. 3. Patient to return to clinic in two weeks for  reevaluation. 4. Consider medial branch blocks leading towards radiofrequency ablation.  Patient was educated on the above and understands.  No barriers to communication. Dictated by:   Sondra Come, D.O. Attending Physician:  Rolly Salter DD:  03/23/01 TD:  03/24/01 Job: [id number]

## 2010-10-19 NOTE — Consult Note (Signed)
[facility]  Patient:    [name] [name], [name] [name] Visit Number: [id number] MRN: [id number]          Service Type: PMG Location: [facility] Attending Physician:  Rolly Salter Dictated by:   Sondra Come, D.O. Proc. Date: 04/08/01 Admit Date:  02/03/2001                            Consultation Report  REASON FOR FOLLOWUP:  [name] [name] returns to clinic today for reevaluation as scheduled.  He was last seen on March 23, 2001 and underwent repeat right L4-5 and L5-S1 facet joint injections.  Patient states that he has had considerable relief of his pain.  His current pain in his low back is a 2-3/10 on a subjective scale.  His function and quality of life have significantly improved as well as his sleep.  He continues to take Vioxx 25 mg daily as well as Percocet 5 mg, typically one and occasionally two times per day.  He has not been to physical therapy yet but has plans to go in the next few weeks.  He currently is also complaining of pain right between his shoulder blades.  He denies any particular injury.  I review health and history form and 14-point review of systems.  No new changes.  PHYSICAL EXAMINATION:  GENERAL:  Physical examination reveals a healthy male in no acute distress.  VITAL SIGNS:  Blood pressure is 135/88, pulse 75, respirations 16.  O2 saturation 98% on room air.  NEUROMUSCULAR:  Examination of the back reveals mild tenderness to palpation over the right lumbar paraspinal muscle.  Manual muscle testing is 5/5, bilateral lower extremities.  Sensory exam is intact to light touch, bilateral lower extremities.  Muscle stretch reflexes are intact, bilateral lower extremities. Straight leg raise is negative, bilateral lower extremities. Examination of the upper back reveals normal scapular motion bilaterally. Palpatory examination reveals trigger points in bilateral levator scapulae muscles.  There is also tenderness to palpation in bilateral  rhomboids.  IMPRESSION: 1. Lumbar facet syndrome. 2. 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 3. Myofascial upper back pain.  PLAN: 1. Physical therapy for range of motion, stretching, flexion, biased lumbar    stabilization exercises as well as scapular stabilization exercises leading    towards a home exercise program. 2. Patient to continue Percocet 5 mg one p.o. b.i.d. as needed for severe    pain. 3. Will consider radiofrequency ablation of medial branches if patients    symptoms return.  This procedure was described to patient in detail    including risks, benefits, limitations and alternatives.  Procedure will be    considered, based on patients symptomatology. 4. Patient to return to clinic in one month.  Patient was educated in the above findings and recommendations and understands.  There were no barriers to communication. Dictated by:   Sondra Come, D.O. Attending Physician:  Rolly Salter DD:  04/08/01 TD:  04/10/01 Job: [id number]

## 2010-10-19 NOTE — Consult Note (Signed)
[facility]  Patient:    [name] [name], [name] [name] Visit Number: [id number] MRN: [id number]          Service Type: PMG Location: [facility] Attending Physician:  Rolly Salter Dictated by:   Sondra Come, D.O. Proc. Date: 02/20/01 Admit Date:  02/03/2001   CC:         Desma Maxim, M.D.   Consultation Report  HISTORY OF PRESENT ILLNESS:  Mr. Monforte returns to clinic today for reevaluation, status post right L4-5 and L5-S1 lumbar facet injections on February 06, 2001.  The patient states that he initially had increased pain after the injections but after about 4-5 days his pain began to decrease. Currently, he states he has an approximately 40% relief of his low-back pain. He also states that he has decreased sharp pain in his right lower extremity which he was previously experiencing.  Currently, today his pain is 5-6 on a subjective scale.  His pain is still constant and achy with occasional burning sensation.  It is improved with medications and made worse with working.  The patients quality of life indices have significantly improved, as well as his sleep.  He denies any bowel or bladder dysfunction.  Health and history form and 14-point review of systems were reviewed.  ALLERGIES:  CODEINE.  MEDICATIONS: 1. Lipitor. 2. Vioxx 25 mg daily. 3. Prilosec 40 mg daily. 4. Aspirin 325 mg daily. 5. Prozac 40 mg daily. 6. Ambien 10 mg as needed. 7. Percocet sparingly for severe pain.  The patient states that 20 pills last    him approximately a month.  PHYSICAL EXAMINATION:  GENERAL:  A healthy male in no acute distress.  BACK:  Examination of the patients back reveals decreased lumbar lordosis with mild tenderness to palpation right lumbar paraspinal.  Range of motion flexion of the lumbar spine is full without significant discomfort.  Extension is full with mild discomfort on the right.  Rotation is full with mild discomfort on the  right.  NEUROLOGIC:  Manual muscle testing is 5/5 bilateral lower extremities.  Muscle stretch reflexes are 2+/4 bilateral patella, medial hamstrings, and Achilles. Sensory exam is intact to light touch bilateral lower extremities.  Straight leg raise is negative bilaterally.  IMPRESSION: 1. Lumbar facet syndrome. 2. Degenerative disk disease in lumbar spine. 3. Nonrestorative sleep disorder improved. 4. Tobacco abuse.  PLAN: 1. Instructed the patient to begin physical therapy.  Prescription for    physical therapy was written on February 04, 2001.  The patient has waited    until after the injections.  I encouraged him to get involved with therapy    program to try to stabilize his lumbopelvic muscles and strengthen them. 2. Instructed the patient to avoid activities which exacerbate his symptoms    and to follow proper body mechanic instructions from a physical therapist. 3. A prescription for Percocet 5 mg/325 mg one p.o. b.i.d. as needed for    severe pain; #60 with no refills.  The patient was counseled on the use of    narcotic analgesia.  I instructed the patient to use the medication    sparingly with the goal of improved function and facilitation of a good    physical therapy program.  The patient understands that narcotic analgesia    is for short-term use and indicated, as long as his functional improvement    continues. 4. Smoking cessation. 5. The patient will return to clinic in one month for reevaluation.  We will  determine whether or not the patient requires further intervention, such as    repeat facet injections versus medial branch blocks leading towards    radiofrequency ablation of the medial branches.  The patient was educated on the above findings and recommendations and understands.  There were no barriers to communication. Dictated by:   Sondra Come, D.O. Attending Physician:  Rolly Salter DD:  02/20/01 TD:  02/20/01 Job: [id number]

## 2010-10-19 NOTE — Assessment & Plan Note (Signed)
HISTORY OF PRESENT ILLNESS:  [name] [name] [facility] and evaluated with a 14-point review of systems.   Problem 1.  Monte comes in today, and we are going to go ahead and hold off  on doing the RF.  He is concerned about the facility fee, and out of pocket  expenses, and we will look into this further.   Problem 2.  Problematic is his right shoulder.  We will go ahead and proceed  with a bursa injection, as his work significantly aggravates his shoulder.  As I have talked to him about this before, he may need to consider some type  of vocational retraining and other lifestyle enhancements.  This includes  cigarette cessation for best outcome.   Problem 3.  He has clearly demonstrated a positive provocative block to the  lumbar spine at the medial branch, and improvement in function and quality  of life.  He has minimized escalation of narcotic-based pain medication, and  is able to work.  He is starting to recrudesce, and we will do what we can  to accommodate him in a cost contained environment for his regular frequency  procedure if he is unable to have this done at the [facility] system.   OBJECTIVE:  His shoulders reveal any diffuse paracervical suprascapular  myofascial discomfort with decreased range of motion, most notably an  abduction to almost full, actively and passively.  He does have pain over  the bursa.  His pain is consistent with pain over PSIS, notable pain on  extension with Gaenslen and Patrick's equivocal.  No neurological findings,  motor and sensory, reflexes.   IMPRESSION:  Facet syndrome, degenerative spine disease, lumbar spine, bursa  pain right shoulder, myofascial pain syndrome.   PLAN:  Suprascapular/levator scapular injection, right side with bursa  injection.  Follow up within the considerations of his facet injection.  He  has consented for today's procedure.   The skin is prepped using 4 cc of Lidocaine, 1% MPF and 40  mg of Aristocort.  I injected in divided dose suprascapular, levator scapular, as well as the  right bursa.  This would be to his shoulder.  Confirmed placement.  He  tolerated this well.  Discharge instructions given.  Will see him in follow  up.   Will go ahead and switch his Oxycodone to 5 mg as he has had quite a bit  pruritus at the 15 mg level.  He brings it in for inspection. He is  appropriate.   DISCHARGE INSTRUCTIONS:  Review medications.  Risks of these medications  reviewed.  Informed consent.      Celene Kras, MD   HH/MedQ  D:  10/11/2003 11:20:20  T:  10/11/2003 12:03:18  Job #:  [id number]

## 2010-10-19 NOTE — Consult Note (Signed)
[facility]  Patient:    [name] [name], [name] [name] Visit Number: [id number] MRN: [id number]          Service Type: PMG Location: [facility] Attending Physician:  Rolly Salter Dictated by:   Dr. Resa Miner. Date: 02/04/01 Admit Date:  02/03/2001   CC:         Desma Maxim, M.D.   Consultation Report  CHIEF COMPLAINT:  Low back pain with pain in the right lower extremity.  HISTORY OF PRESENT ILLNESS:  This is the initial consultation for [name] who is a 55 year old pleasant male who was kindly referred by Dr. Arvilla Market for evaluation and treatment of his back and lower extremity pain. [name] [name] states that he has had a 5 to 6 year history of pain in his low back which was progressively worsening.  He states that his pain radiates into his posterior thigh and calf intermittently, especially towards the end of the day after activity.  He notes a grinding sensation in the right aspect of his lumbar spine.  His symptoms are occasionally associated with numbness and paresthesias into the right lower extremity diffusely, but mainly in the posterior aspect.  The patient has had multiple physician visits for this pain, and has seen a neurologist, rheumatologist, and an orthopedic surgeon. To this point he has received conservative care, but states that his symptoms continue to progress.  The patients current pain level is 7 to 8/10 on a subjective scale, and characterized as constant, sharp, burning, and stabbing. The patients symptoms are improved with medications to some degree, including hydrocodone and oxycodone.  The patients symptoms are made worse by working. 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  The patient denies any preceding injury, and is not on Workers Compensation or receiving disability.  The patient denies bowel or bladder dysfunction.  Denies increased back pain with Valsalva  maneuver. Denies fevers, chills, night sweats, or weight loss.  The patients function and quality of life has significantly declined, and he rates his sleep as poor.  Health and history form and 14 point review of systems was reviewed.  PAST MEDICAL HISTORY: 1. Myocardial infarction in 1999. 2. Depression.  PAST SURGICAL HISTORY:  Coronary stent implant in 1999.  FAMILY HISTORY:  Heart disease, diabetes, high blood pressure.  SOCIAL HISTORY:  The patient currently runs a landscaping business and continues to work despite his pain.  He likes his job.  He denies any legal action, and is not receiving Workers Youth worker.  The patient smokes 1-1/2 packs of cigarettes per day, and I counseled him on the importance of smoking cessation.  He admits to occasional alcohol use.  He is married.  ALLERGIES:  CODEINE.  MEDICATIONS: 1. Lipitor. 2. Vioxx 25 mg q.d. which offers minimal relief. 3. Prilosec 40 mg q.d. 4. Prozac 40 mg q.d. 5. Aspirin 325 mg q.d. 6. Ambien 10 mg at bedtime with mildly improved sleep. 7. Hydrocodone 5 mg p.r.n. 8. Oxycodone 5 mg p.r.n.  PHYSICAL EXAMINATION:  GENERAL:  A healthy male in no acute distress.  The patient has some discomfort while getting up from the chair, not to the examining table, as well as rolling side-to-side on the examination table.  VITAL SIGNS:  Blood pressure is 133/85, pulse 69, respiratory rate 18.  NEUROLOGIC:  Gait is normal.  Affect is bright.  The patient is alert.  SPINE:  Decreased lumbar lordosis with significant tenderness to palpation in paraspinal muscles  bilaterally, right greater than left.  Range of motion of the lumbar spine reveals mildly decreased flexion secondary to pain. Extension is also limited secondary to pain.  Right rotation plus extension causes severe pain with minimal pain on left rotation plus extension.  Side bending is full without pain.  Manual muscle testing is 5/5 bilateral lower extremities.   Sensory examination reveals decreased light touch diffusely in the right lower extremity in a non-dermatomal distribution.  Muscle stretch reflexes are 2+/4 bilateral patellar and medial hamstrings and Achilles. Straight leg raise is negative bilaterally.  Lia Hopping test is negative bilaterally.  The patient is noted to have significantly tight hamstrings and hip flexors bilaterally.  PERIPHERAL VASCULAR:  There is no heat, erythema, or edema noted.  Peripheral pulses are equal bilateral lower extremities.  The patients records are reviewed, including reports of CT scan of his lumbar spine dated 08/31/97, revealing annular disk bulge and possible right lateral bulging at L4-5 with facet hypertrophy bilaterally.  MRI is reported as showing multi-level degenerative disk changes.  IMPRESSION: 1. 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 2. Non-restorative sleep disorder.  PLAN: 1. We will have the patient return to clinic for right lumbar facet injections    for therapeutic and diagnostic purposes. 2. Continue current medications. 3. Physical therapy for a neutral to flexion based lumbar stabilization    exercise program, lower extremity stretching, estem, leading to home    exercise program.  The patient was counseled that this is an intricate part    of his overall treatment plan in order to strengthen his lumbopelvic area    as well as aggressive lower extremity stretching. 4. Counseled on smoking cessation.  The patient was educated on the above findings and recommendations, and understands.  There were no barriers to communication. Dictated by:   Dr. Andrey Campanile Attending Physician:  Rolly Salter DD:  02/04/01 TD:  02/04/01 Job: [id number]

## 2010-10-19 NOTE — Op Note (Signed)
   NAME:  [name] [name], [name] [name]                         ACCOUNT NO.:  [id number]   MEDICAL RECORD NO.:  [id number]                   PATIENT TYPE:  AMB   LOCATION:  DSC                                  FACILITY:  [facility]   PHYSICIAN:  Lowell Bouton, M.D.      DATE OF BIRTH:  1956-03-16   DATE OF PROCEDURE:  06/09/2002  DATE OF DISCHARGE:                                 OPERATIVE REPORT   PREOPERATIVE DIAGNOSES:  Lateral epicondylitis, right elbow.   POSTOPERATIVE DIAGNOSES:  Lateral epicondylitis, right elbow.   PROCEDURE:  __________  procedure, right elbow.   SURGEON:  Lowell Bouton, M.D.   ANESTHESIA:  General.   FINDINGS:  The patient had significant scarring around his ECRB origin with  obvious disruption from the lateral epicondyle. The lateral collateral  ligament was also scarred but present.   DESCRIPTION OF PROCEDURE:  Under general anesthesia with a tourniquet on the  right arm, the right arm was prepped and draped in the usual fashion. After  exsanguinating the limb, the tourniquet was inflated to 225 mmHg. A  longitudinal incision was made from the lateral epicondyle distally and  sharp dissection carried through the subcutaneous tissues. Bleeding points  were coagulated. Sharp dissection was carried through the extensor origin  and down through the muscle. The extensor carpi radialis brevis was  identified where it had detached from the lateral epicondyle. There was  significant scarring and this tissue was all debrided. The lateral  collateral ligament appeared to be scarred but intact. After debriding the  extensor origin of the brevis, some drill holes were placed in the lateral  epicondyle and the superficial muscle and fascia was closed with 3-0  Ethibond suture. The skin was closed with a 3-0 subcuticular Prolene, Steri-  Strips were applied followed by sterile dressings and a posterior elbow  splint. The patient had 0.5% Marcaine inserted for  pain control. He went to  the recovery room awake in good condition.                                               Lowell Bouton, M.D.    EMM/MEDQ  D:  06/09/2002  T:  06/09/2002  Job:  [id number]

## 2010-10-19 NOTE — Procedures (Signed)
[facility]  Patient:    [name] [name], [name] [name] Visit Number: [id number] MRN: [id number]          Service Type: PMG Location: [facility] Attending Physician:  Sondra Come Dictated by:   [name], M.D. Proc. Date: 08/19/98 Admit Date:  06/05/2001                             Procedure Report  HISTORY:  [name]. I evaluate his Review of Health and History form, 14-point review of systems. I review progress to date.  PROBLEM LIST: 1. [name] [name] comes in today and having some descriptions of improvement with    improved range of motion, but is still having difficulties with pain. I    think it is reasonable to go ahead and proceed with a second in series of    cervical epidurals and predicate third based on need and overall    responsiveness. As he lateralizes his symptoms to the left, would    consider facetal injection with our strategy being positive provocative    block leaning [place] to consider radiofrequency neuroablation. 2. Health and history form reviewed, 14-point review of systems. I do not    believe further imaging or diagnostics are warranted. 3. Review of medication:  He is appropriate. I will go ahead and renew his    Percocet. Rationale is to perform these injections with full intent to move    to nonnarcotic medication alternatives. Neurotomy may help [place] if we need to    move into that direction. He will assess this within the context of    activities of daily living.  PHYSICAL EXAMINATION:  Objectively, he has diffuse paracervical myofascial discomfort, impaired flexion, extension, and lateral rotational pain. Positive cervical facetal compression test, left greater than right. Suboccipital compression test positive, left greater than right. He has no new neurological features, motor, sensory, reflexive.  IMPRESSION: 1. Cervicalgia. 2. Degenerative spinal disease, cervical spine. 3.  Cervical facet syndrome.  PLAN:  Cervical epidural. He is consented. Fluoroscopic imaging identifying appropriate landmarks.  DESCRIPTION OF PROCEDURE:  The patient was taken to the fluoroscopy suite and placed in prone position. Fluoroscopic imaging identifies appropriate landmarks. The neck is then prepped and draped in the usual fashion, and I advance a Hustead needle to the C5-6 interspace without any evidence of CSF, heme, or paresthesia. I test block uneventfully, followed with 40 mg of Aristocort and flush the needle.  He tolerates the procedure well, no complication from our procedure. Discharge instructions given. Review of medication and appropriate risk. Review patient care agreement. Dictated by:   [name], M.D. Attending Physician:  Sondra Come DD:  08/18/01 TD:  08/19/01 Job: [phone] [id number]

## 2010-10-19 NOTE — Assessment & Plan Note (Signed)
[facility]                              CARDIOLOGY OFFICE NOTE   NAME:[name] [name], ROLLINS WRIGHTSON                      MRN:          [id number]  DATE:02/18/2006                            DOB:          [birth year]    SUBJECTIVE:  Shakeel returns today for followup.  He is cleared for back  surgery.  I personally spoke to Dr. Sharolyn Douglas.  He had his cath on February 07, 2006.  Stent to the circumflex and intermediate were widely patent.  He  had good LV function and no residual disease.  I did tell the patient that I  would prefer for him to start Plavix 2 weeks after back surgery given his  vascular disease.  He continues to have significant pain and really does  need his lower back surgery.  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   OBJECTIVE:  On exam, his blood pressure is 142/80, pulse 67 and regular.  Lungs clear.  Carotids normal.  There is an S1 S2 with normal heart sounds.  Cath site is well healed.  Abdomen is benign.  Distal pulses are intact.  No  edema.   MEDICATIONS:  1. Ambien 10 nightly.  2. Prozac 40 a day.  3. Nexium 40 a day.  4. An aspirin a day.  5. Nicorette gum 2 mg eight a day.  6. Vytorin 10/40.  7. Plavix to start 2 weeks after back surgery.   I would be happy to see Mr. Hazelrigg postop if needed by Dr. Noel Gerold.  I think  his surgery is scheduled for this Thursday.                               [name], MD, [facility]    PCN/MedQ  DD:  02/18/2006  DT:  02/18/2006  Job #:  [id number]

## 2010-10-19 NOTE — Cardiovascular Report (Signed)
NAMEFERGUS, THRONE NO.:  [id number]   MEDICAL RECORD NO.:  [id number]          PATIENT TYPE:  OIB   LOCATION:  1961                         FACILITY:  [facility]   PHYSICIAN:  Veverly Fells. Excell Seltzer, MD  DATE OF BIRTH:  Jan 13, 1956   DATE OF PROCEDURE:  02/07/2006  DATE OF DISCHARGE:                              CARDIAC CATHETERIZATION   PROCEDURE:  1. Left heart catheterization.  2. Selective coronary angiogram.  3. Left ventricular angiogram.   INDICATIONS:  [name] [name] is a 55 year old gentleman with known coronary  artery disease who has had prior stenting to his left circumflex and ramus  intermedius who has symptoms of exertional dyspnea and intermittent chest  pressure.  He has planned back surgery in the next few weeks.  He underwent  a preoperative stress test that demonstrated a small area of inferolateral  ischemia.  With his history of known coronary artery disease, exertional  symptoms, and abnormal cardiovascular imaging study, he was referred for  diagnostic cardiac catheterization.   ACCESS:  Right femoral artery.   COMPLICATIONS:  None.   PROCEDURAL DETAILS:  Risks and indications were explained to the patient in  detail.  Informed consent was obtained.  The right groin was prepped and  draped in normal sterile fashion.  Anesthetized with 1% lidocaine.  Using  the modified Seldinger technique, a 4-French arterial sheath was placed in  the right common femoral artery.  Catheters included a 4-French JL-4, 3D RC,  and angled pigtail catheter.  All catheter exchanges were performed over a  guidewire.  At the conclusion of the case the right femoral arterial sheath  was pulled and manual pressure was used for hemostasis.   FINDINGS:  Aortic pressure 116/79 with a mean of 97.  Left ventricular  pressure 116/5 with an end-diastolic pressure of 8.   Left main coronary artery is normal.  It trifurcates into the left  circumflex, a large ramus branch, and  an LAD.  The LAD is mildly ectatic in  its proximal portions.  There is mild nonobstructive disease throughout this  region in the range of 25-50%.  The remainder of the mid and distal LAD is  small diameter but angiographically normal.  The LAD gives off two septal  perforators that appear normal.   The ramus intermedius is a large diameter vessel that supplies much of the  lateral wall.  There is a stent in its proximal portion that is widely  patent.  The remainder of the ramus branch is angiographically normal.  Distally it bifurcates and then gives off multiple branches.  There is a  small side branch that is jailed by the stent in the proximal ramus and  appears to have an associated ostial lesion.   The left circumflex is a small to medium caliber vessel.  The proximal  circumflex is angiographically normal.  At a branch point where an atrial  branch arises, there is a 25-40% lesion.  There is a stented segment in the  mid circumflex that is widely patent with only mild diffuse restenosis.  The  remainder of the  left circumflex in the AV groove and in a small distal  branch appears angiographically normal.   The right coronary artery is dominant.  It has mild nonobstructive disease  proximally and through the mid segment.  The distal right coronary artery is  angiographically normal and it gives off a posterolateral branch that has no  angiographic disease.  The PDA is medium caliber and angiographically  normal.   Left ventriculogram was performed in the 30-degree RAO projection.  The left  ventricular function is normal with normal wall motion throughout and an  estimated LV ejection fraction of 55-60%.   ASSESSMENT:  1. 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  2. Patent stent in the left circumflex, which is a medium caliber vessel.  3. Patent stent in the ramus intermedius, which is a large caliber vessel.  4. Normal left  ventricular systolic function.   PLAN:  Continue medical therapy.  There is no evidence of high-grade  obstructive disease and the patient does not require any further  intervention at this point.  I discussed the findings with Dr. Eden Emms, and  Mr. Ciotti should do well from a cardiovascular standpoint with his planned  upcoming back surgery.  He will follow up with Dr. Eden Emms following surgery.  He should continue on his current medical therapy throughout the  perioperative period.      Veverly Fells. Excell Seltzer, MD  Electronically Signed     MDC/MEDQ  D:  02/07/2006  T:  02/07/2006  Job:  [id number]   cc:   [name], MD, [facility]  Sharolyn Douglas, M.D.

## 2010-10-19 NOTE — Consult Note (Signed)
[facility]  Patient:    [name] [name], [name] [name] Visit Number: [id number] MRN: [id number]          Service Type: PMG Location: [facility] Attending Physician:  Sondra Come Dictated by:   Sondra Come, M.D. Proc. Date: 07/09/01 Admit Date:  06/05/2001   CC:         Desma Maxim, M.D.   Consultation Report  HISTORY OF PRESENT ILLNESS:  [name] [name] returns to clinic today as scheduled for reevaluation.  He was last seen on June 04, 2001.  [name] [name] states that overall he is improved and pain remains fairly stable in his low back following facet injections as well as with Percocet 5 mg b.i.d. as needed. The patient states that he typically takes the Percocet twice a day, rarely does he take it only once or not at all.  Currently he complains mainly of pain in his right upper back.  We have done trigger point injections to right upper back and periscapular trigger points twice in the past with significant relief of his pain.  He states it has returned to baseline.  His pain today is an 8/10 on a subjective scale.  His pain is achy and burning.  Overall his function and quality of life indices have improved.  His sleep is fair to good with Ambien.  The patient continues to walk approximately one mile per day. He continues to do a home exercise program as he has discontinued physical therapy.  He states that the twisting exercises that they were teaching him were aggravating his symptoms but he feels that the abdominal and extensor strengthening exercises are helping and he is doing this at home on his own. I review health and history form, and 14-point review of systems.  PHYSICAL EXAMINATION:  GENERAL:  The patient is a healthy male in no acute distress.  BACK:  Level pelvis without scoliosis.  There is mildly decreased lumbar lordosis.  There is mild tenderness to palpation bilateral lumbar paraspinals.  Range of motion is somewhat  guarded.  MUSCULOSKELETAL:  Manual muscle testing is 5/5 bilateral upper and lower extremities.  Sensory examination is intact to light touch bilateral upper and lower extremities.  Muscle stretch reflexes are 2+/4 bilateral upper and lower extremities.  Palpatory examination reveals trigger points in the right upper trapezius and levator scapulae muscles.  These reproduce the patients symptoms in his upper back.  IMPRESSION: 1. Myofascial upper back and neck pain. 2. Lumbar facet syndrome, improved and stable.  PLAN:  Repeat right upper trapezius and levator scapulae trigger point injections.  The procedure was described to the patient and informed consent was obtained.  DESCRIPTION OF PROCEDURE:  The skin was prepped in the usual sterile fashion. Each trigger point was injected with 1 cc of 1% lidocaine using a 25-gauge 1-1/4 inch needle without complication.  The patient tolerated the procedure well.  Discharge instructions were given.  RECOMMENDATIONS: 1. The patient is to continue current medications to include Vioxx and    Percocet 5 mg b.i.d. as needed as well as Ambien. 2. The patient is to continue with home exercise program. 3. The patient is to return to clinic in one month for reevaluation.  If the    patient has no significant relief with the trigger point injections, will    consider obtaining an MRI of the cervical spine to rule out underlying    neuroaxial pathology.  The patient was educated in the above findings and  recommendations, and understands.  There were no barriers to communication. Dictated by:   Sondra Come, M.D. Attending Physician:  Sondra Come DD:  07/10/01 TD:  07/11/01 Job: [phone] [id number]

## 2010-10-19 NOTE — Procedures (Signed)
NAME:  [name], [name]                         ACCOUNT NO.:  [id number]   MEDICAL RECORD NO.:  [id number]                   PATIENT TYPE:  REC   LOCATION:  [facility]                                  FACILITY:  [facility]   PHYSICIAN:  Celene Kras, MD                     DATE OF BIRTH:  01-Nov-1955   DATE OF PROCEDURE:  06/14/2003  DATE OF DISCHARGE:                                 OPERATIVE REPORT   Eragon [facility].  I evaluated,  reviewed the health and history form and 14-point review of systems.   1. Dick comes to [place] complaining of his typical right-sided paralumbar     discomfort.  He has had decline in function and quality of life  indices,     and I think it is reasonable to go ahead and proceed with a facetal     injection.  The RF afforded him many months of good relief cycling and     these have broken through.  He continues to work.  2. I am going to recommend cigarette cessation again as best outcome.  I     strongly urge him for best outcome.  3. Other lifestyle enhancements discussed, home base therapy, and formalized     physical therapy.   Predicate further injection based on need and overall response.  I will see  him in a month.  Consider moving toward the RF again, as this did help him.  He also thinks the blocks helped him a great deal as well.   OBJECTIVE:  Diffuse paralumbar myofascial discomfort, pain over PSIS,  notable pain on extension with Gaenslen's and Patrick's equivocal.  He has  no new neurological findings, motor, sensory or reflexes.   IMPRESSION:  Degenerative spinal disease lumbar spine, facet syndrome.   PLAN:  Facet injection, L5-S1, L4-5, L3-4, 2-3 with contributory innervation  addressed.  He has consented. This will be on the right side.   The patient taken to fluoroscopy suite and placed prone position, leg  prepped and draped in usual fashion.  Using a 22-gauge spinal needle, I  advanced to the facet  medial branch L4-S1, L4-5, L3-4, L2-3 in independent  needle access points and confirmed placement.  I then inject 1 mL of  lidocaine at each level with contributory innervation addressed.  Aristocort  40 mg in divided doses injected.   Tolerated this procedure well with no complications from our procedure,  appropriate recovery.  Discharge instructions given.  Will see him in follow-  up.                                                Celene Kras, MD    HH/MEDQ  D:  06/14/2003 12:05:34  T:  06/14/2003 12:47:30  Job:  [id number]

## 2010-10-19 NOTE — Assessment & Plan Note (Signed)
[name].  I evaluated,  reviewed the health and history form and 14-point review of systems.   1. [name] [name] has many questions about his movement forward.  He still works in     [employer], this is a busy season for him, and he is     wondering how he can get through the summer.  He does relate relief     cycling, posterior approach, at the medial branch via RF, as well as     anterior compartment injection with the transforaminal epidural.  He     relates his pain rightward, and I think we have benefitted him as he has     not escalated his narcotic base pain medication particularly.  He has no     advancing neurological features, did not believe further imaging or     diagnostics were warranted.  2. To improve his function and quality of life  indices, it is reasonable to     reperform the RF, as this has helped him, and we are avoiding moving into     the surgical arena.  To date, he has only had to take one or two Percocet     a day when he is working, and I cautioned.  I do think if we increase the     milligram strength and avoid acetaminophen, he could probably avoid     interventional procedure over the next month, but again, this medication     is not necessarily a Band-Aid, we want to find him looking for the     lifestyle enhancements that will improve outcome.  He will see his     primary care physician about cigarette cessation and we have a long talk     about this, home based therapy and weight control.  3. In about a month or a month and a half, if he is not significantly     improved and his lifestyle is impaired, particularly if we are not moving     to a very occasional p.r.n. strategy with narcotic base pain medication,     I will consider reinforcing the RF.  The radicular component is not as     prominent.  He does have more mechanical pain.  This interferes with his     ability to perform weed eating,  etc., and will look further at this     mechanism.   OBJECTIVE:  Diffuse paralumbar myofascial discomfort, radicular component  seems to be improved as noted.  The straight leg lift is unimpaired.  He has  pain with extension, pain over PSIS.  No new neurologic complaints, motor,  sensory or reflexes.   IMPRESSION:  1. Degenerative spinal disease, lumbar spine.  2. Facet syndrome.   PLAN:  As outlined above.  Will see him in follow-up.  I will prescribe  Roxicodone to be used sparingly and appropriately with cautions given.  Zanaflex for restorative sleep capacity, and to use Ambien only p.r.n.  Will  also trial Celebrex as his cardiologist has cleared him for this.  I have  discussed this with him as well as risks.      Celene Kras, MD   HH/MedQ  D:  09/06/2003 10:13:02  T:  09/06/2003 11:07:45  Job #:  [id number]

## 2010-10-19 NOTE — Assessment & Plan Note (Signed)
[facility]                            CARDIOLOGY OFFICE NOTE   NAME:[name] [name]                      MRN:          [id number]  DATE:08/14/2006                            DOB:          [birth year]    [name] [name] returns today for followup.  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  We cathed him at the end  of 2007 in preparation for back surgery.  The stents were widely patent  and he had normal LV function.  It took a while for them to get him back  on his Plavix, post-lumbar surgery.  His lumbar surgery was done on  February 20, 2006.  He has had mixed results from the surgery and  continues to do rehab in [place].  He has not had any significant  chest pain.  He has not had any bleeding diathesis and he is back on his  Plavix and aspirin.   The patient is a previous long-time smoker.  He started smoking at the  age of 41 and has over a 50 pack-year history.  He has been having some  exertional dyspnea.  A lot of it sounds positional, such as when he  bends over.  However, given his long smoking history, I think he should  have some baseline PFTs.  His risk factors are well-modified.  He is on  Vytorin 10/40.  His labs prior to his catheterization showed normal  LFTs.   ON EXAM:  He looks well.  Blood pressure is 130/70, pulse 70 and  regular.  HEENT is normal.  Carotids normal without bruit.  Lungs were  clear.  His lumbar spine surgery scar is well-healed.  He does have  decreased air movement, but no wheezes.  There is an S1, S2, normal  heart sounds.  Abdomen is benign.  Lower extremities with intact pulses,  no edema.   MEDICATIONS INCLUDE:  1. Ambien 10 q.h.s.  2. Prozac 40 a day.  3. Nexium 40 a day.  4. Nicorette gum 2 mg 10-12 pieces a day.  5. Vytorin 10/40.  6. Plavix 75 a day.  7. Lyrica 75 a day.   His EKG was totally normal today.   IMPRESSION:  Stable two-vessel coronary artery  disease.  Patent stents  by cath at the end of 2007.  No complications with lumbar surgery.  Continue aspirin and Plavix.  Continue Vytorin for hypercholesterolemia.  The patient will have some baseline PFTs in regards to his dyspnea,  which I think are more related to his previous smoking history.  He has  good LV function and no  evidence of fluid excess.  I will see him back in six months and we will  make sure Dr. Arvilla Market gets a copy of his PFTs.     [name], MD, [facility]  Electronically Signed    PCN/MedQ  DD: 08/14/2006  DT: 08/15/2006  Job #: [id number]   cc:   Donia Guiles, M.D.

## 2010-10-19 NOTE — Consult Note (Signed)
**Note [name] [name]-Identified via Obfuscation** [facility]  Patient:    [name] [name], [name] [name] Visit Number: [id number] MRN: [id number]          Service Type: PMG Location: [facility] Attending Physician:  Rolly Salter Dictated by:   Sondra Come, D.O. Proc. Date: 05/06/01 Admit Date:  02/03/2001 Discharge Date: 05/04/2001                            Consultation Report  REASON FOR FOLLOWUP:  [name] [name] returns to clinic today as scheduled for reevaluation.  He was last seen on April 15, 2001.  Since last visit, [name] [name] states that his left upper back myofascial pain has resolved following trigger point injections.  His right upper back trigger points are still somewhat painful but overall improved.  His low back pain is doing very well.  In the interim, he has had sports massage x 2, which he states helped significantly temporarily.  He begins physical therapy next week.  His current pain level is a 4/10 on a subjective scale.  His function and quality-of-life indices have improved overall.  His sleep remains stable.  He continues to take oxycodone 5 mg one to two daily as needed for severe pain.  He is taking this appropriately.  He also takes Ambien 10 mg one-half to one tab at bedtime for sleep.  He denies any weakness in the upper extremities.  Denies any persistent numbness or paresthesias in the upper extremities, although he does mention that infrequently he notices some tingly sensation over the right deltoid.  Denies any other neurologic complaints.  I review health and history form and 14-point review of systems.  PHYSICAL EXAMINATION:  GENERAL:  Physical examination reveals a healthy male in no acute distress.  VITAL SIGNS:  Blood pressure is 138/88, pulse 73, respirations 12, O2 saturation 100%.  NEUROMUSCULAR:  Manual muscle testing is 5/5, bilateral upper extremities. Sensory exam is intact to light touch, bilateral upper extremities, at this time.  Muscle stretch reflexes are 2+/4  in bilateral biceps, triceps, brachioradialis and pronator teres.  There is no atrophy noted in the upper extremities bilaterally.  Palpatory examination reveals trigger points in the right upper trapezius and levator scapulae.  Spurlings maneuver is negative on the right.  No trigger points noted in the left upper back and periscapular muscles.  IMPRESSION: 1. Bilateral myofascial upper back pain, resolved on the left, persistent on    the right. 2. Facet arthropathy with low back pain.  This is doing very well at this    point.  PLAN: 1. Trigger points injections to the right levator scapulae and upper    trapezius.  Procedure was described to the patient and informed consent was    obtained.  Skin was prepped in the usual sterile fashion.  Each trigger    points was injected with 1.5 cc of 1% lidocaine using a 25-gauge    1-1/2-inch needle without complication.  Patient tolerated the procedure    well.  Discharge instructions given. 2. Continue current medications to include Vioxx, Percocet as needed and    Ambien. 3. Await physical therapy to begin next week. 4. Patient to follow up in one month for reevaluation.  Patient was educated in the above findings and recommendations and understands.  There were no barriers to communication.Dictated by:   Sondra Come, D.O. Attending Physician:  Rolly Salter DD:  05/06/01 TD:  05/06/01 Job: [id number]

## 2010-10-19 NOTE — Assessment & Plan Note (Signed)
MEDICAL RECORD NUMBER:  [id number].   [name] returns regarding his low back pain. He had good results with the  IDD therapy. His pain is down to a 3/10. He is off of the Avinza. He did not  find it helpful. He is only taking occasional Percocet now for pain. He is  having emerging problems with the elbows, left greater right. 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 He does report morning pain and often  happens in the distal finger joints. Pain interferes with general activity,  relations with others, and enjoyment of life only on a moderate basis. Pain  seems generally worse at night and first thing in the morning. Pain  increases with bending and sitting. Improves with medication injections and  IDD. He has been trying to keep up with exercises recommended by his  therapist.   REVIEW OF SYSTEMS:  The patient reports occasional tightness with walking.  Denies any GI, GU, cardiorespiratory, constitutional, neurological or  psychological complaints today.   SOCIAL HISTORY:  The patient continues to work as noted above. Wife is  supportive.   PHYSICAL EXAMINATION:  Blood pressure is 124/74, pulse 69, respiratory rate  16. He is saturating 99% on room air. The patient is pleasant in no acute  distress. Affect is good. His range of motion is much improved. He continues  to have flattening of the lordotic curve with some tightness of the  paraspinal muscles. He has little gluteus maximus mass. Range of motion was  about 50 to 60% of forward flexion, 10 to 15 degrees with extension. He had  reasonable lateral rotation and bending today. Strength is 4+ to 5/5  throughout. No focal neurological signs were seen. Reflexes were 2+. Sensory  exam was intact. Straight leg raise testing was negative. SI joint testing  was negative to equivocal. Facet testing was mildly provocative today. On  examination of the left elbow, he was painful to left wrist extensor tendon  and along the  insertion on the radius. He had pain with resisted wrist  extension as well as middle finger extension today.   ASSESSMENT:  1.  Degenerative disk disease of lumbar spine.  2.  Facet syndrome of lumbar spine.  3.  Lateral epicondylitis.   PLAN:  1.  After informed consent, we injected the left lateral epidural area along      the common extensor tendon with 2 cc of 1% lidocaine and 40 mg of      Kenalog. The patient tolerated this well. I advised ice and extensor      band over the left forearm. He needs to try to relatively rest the arm      as well.  2.  Continue exercises and range of motion per therapy recommendations. He      has had good results with IDD therapy.  3.  I refilled Percocet at 5/325 for breakthrough pain which he is using      only sparingly at this point.  4.  Continue Lidoderm patches 5% to the back two patches as needed daily.  5.  Will see the patient back in about six weeks' time.       ZTS/MedQ  D:  11/30/2004 13:36:03  T:  11/30/2004 15:04:00  Job #:  [id number]

## 2010-10-19 NOTE — Procedures (Signed)
[facility]  Patient:    [name] [name], [name] [name] Visit Number: [id number] MRN: [id number]          Service Type: PMG Location: [facility] Attending Physician:  Rolly Salter Dictated by:   Sondra Come, M.D. Proc. Date: 02/06/01 Admit Date:  02/03/2001                             Procedure Report  INDICATIONS:  Mr. Newman returns to clinic today for right lumbar facet injections. The patient has no change in symptoms or medical history since two days ago at his initial visit.  IMPRESSION:   Degenerative spinal disease of the lumbar spine with facet arthropathy.  PROCEDURE:  Right L4-L5 and L5-S1 facet steroid injections. The procedure was explained in detail to patient. Informed consent was obtained.  DESCRIPTION OF PROCEDURE:  The patient was brought back to the fluoroscopy suite and and placed on the table in prone position. Skin was prepped in usual sterile fashion. Skin and subcutaneous tissue were anesthetized at two separate injection sites with 2 cc of 1% lidocaine at each site. Under direct fluoroscopic guidance in the AP view, an 22-gauge 3-1/2 inch spinal needle was advanced into the inferior recess of L4-L5 and inferior recess of L5-S1 facet joints with separate needle insertion points. After negative aspirate, needle position was confirmed with injection of 0.25 cc of Isovue-M 200 revealing right L4-L5 and L5-S1 facet athrograms. Each facet was then injected with 0.5 cc of 1% lidocaine with 0.5 cc of Aristocort 40 mg/cc without complication. The patient tolerated the procedure well.  DISPOSITION: 1. Discharge instructions were given. 2. Patient to return to clinic in two weeks for reevaluation. Dictated by:   Sondra Come, M.D. Attending Physician:  Rolly Salter DD:  02/06/01 TD:  02/07/01 Job: [id number]

## 2010-10-19 NOTE — Consult Note (Signed)
[facility]  Patient:    [name] [name], [name] [name] Visit Number: [id number] MRN: [id number]          Service Type: PMG Location: [facility] Attending Physician:  Sondra Come Dictated by:   Sondra Come, D.O. Proc. Date: 09/21/01 Admit Date:  09/15/2001                            Consultation Report  [name] returns to clinic today for reevaluation.  He was last seen by me on August 10, 2001.  In the interim he has had three cervical epidural steroid injections for degenerative disk disease of the cervical spine with disk herniation at C5-6 and cervicalgia with upper extremity radicular symptoms on the right.  Patient states that his neck pain and right upper extremity radicular symptoms have improved considerably with improved mobility and function and quality of life indexes.  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  He states that he has had steroid injections locally which have offered considerable relief for up to one year.  He requests repeat steroid injection. Last one was over one year ago.  His pain level today is a 4/10 on a subjective scale.  His sleep is improved.  He continues on Vioxx 25 mg daily, Percocet 5 mg one p.r.n. typically every few days.  He also continues on Ambien at bedtime.  I review health and history form and 14 point review of systems.  PHYSICAL EXAMINATION  GENERAL:  Healthy male in no acute distress.  EXTREMITIES:  There is significant tenderness to palpation over the lateral epicondyle on the left with reproduction of symptoms with resisted wrist extension and passive wrist flexion on the left.  NEUROLOGIC:  Manual muscle testing is 5/5 bilateral upper extremities with some giveaway weakness and pain inhibition on the left.  Sensory examination is intact to light touch bilateral  upper extremities.  Muscle stretch reflexes are 2+/4 bilateral upper extremities.  IMPRESSION: 1. Left lateral epicondylitis. 2. Cervicalgia, significantly improved. 3. Degenerative disk disease of the cervical spine with small C5-6 herniation    with associated right upper extremity radicular symptoms essentially    resolved. 4. Lumbar facet syndrome continues to be significantly improved.  PLAN: 1. Had thorough discussion with [name] [name] regarding his lateral epicondylitis    and treatment options.  We discussed physical therapy for modalities    including phonophoresis and electrophoresis as well as heat and cold    modalities.  Realistically, [name] [name] would have a hard time getting into    a consistent physical therapy program given the nature of his job.  We    discussed this at length.  We also discussed local steroid injection with    associated risks, benefits.  He wishes to proceed in this fashion.    Informed consent was obtained.  Description of procedure:  Skin was prepped    in usual sterile fashion with Betadine and alcohol.  The extensor tendinous    sheath was injected with 0.5 cc of Kenalog 40 mg/cc plus 1 cc of 1%    lidocaine using a 25-gauge 1 1/2 inch needle without complication.  Patient    tolerated the procedure well.  Discharge instructions given. 2. Patient encouraged to use ice today and liberally following work days. 3. Consider a physical therapy program for modalities  as well as    reconditioning and gentle friction massage. 4. Continue current medications. 5. Patient to continue home exercise program for his neck and back. 6. Patient to return to clinic in two weeks for reevaluation.  Patient was educated on the above findings and recommendations and understands.  No barriers to communication. Dictated by:   Sondra Come, D.O. Attending Physician:  Sondra Come DD:  09/21/01 TD:  09/21/01 Job: [id number]

## 2010-10-19 NOTE — Consult Note (Signed)
[facility]  Patient:    [name] [name], [name] [name] Visit Number: [id number] MRN: [id number]          Service Type: PMG Location: [facility] Attending Physician:  Rolly Salter Dictated by:   Sondra Come, D.O. Proc. Date: 06/04/01 Admit Date:  02/03/2001 Discharge Date: 05/04/2001   CC:         Desma Maxim, M.D.   Consultation Report  REASON FOR FOLLOWUP:  [name] [name] returns to clinic today as scheduled for reevaluation.  He was last seen on May 06, 2001.  [name] [name] had been doing fairly well overall in terms of low back pain and upper back myofascial pain.  He notes that approximately one and a half weeks ago he had a recurrence and exacerbation of his right lower back pain.  This was previously controlled with lumbar facet injections.  The last injections were in October of 2002.  Currently, his pain is a 9/10 on a subjective scale.  He requests repeat facet injections.  He continues with physical therapy and is unsure whether or not some of the exercises he is performing are contributing to his increased low back pain.  His function and quality of life have declined recently secondary to this exacerbation.  His sleep is fair to poor.  He continues to take Percocet 5 mg up to b.i.d. as needed for pain.  He also takes Ambien 10 mg at bedtime.  He continues on Vioxx 25 mg daily.  I review health and history form and 14-point review of systems.  No new neurologic complaints.  PHYSICAL EXAMINATION:  GENERAL:  Physical examination reveals a healthy male in no acute distress.  NEUROMUSCULAR:  Manual muscle testing is 5/5, bilateral lower extremities. Sensory exam reveals slight decrease to light touch in the right anterior thigh, posterior thigh and posterior leg.  Muscle stretch reflexes are 2+/4, bilateral lower extremities.  Palpatory examination reveals significant tenderness to palpation in right lumbar paraspinal muscles with guarding. Range of  motion of lumbar spine is full with pain on extension and extension plus rotation and compared to flexion.  IMPRESSION: 1. Lumbar facet syndrome. 2. Myofascial upper back and neck pain, improved.  PLAN: 1. Will repeat right L4-5 and L5-S1 facet blocks.  Procedure was discussed    with patient in detail again.  Informed consent was obtained.  Patient was    brought back to the fluoroscopy suite and placed on the table in prone    position.  Skin was prepped and draped in the usual sterile fashion.  Skin    and subcutaneous tissue were anesthetized with 2 cc of 1% lidocaine at two    independent needle access points.  Under direct fluoroscopic guidance, a    22-gauge 3-1/2-inch spinal needle was advanced into the inferior recesses    of L4-5 and L5-S1 facet joints; this was done using independent needle    access points.  Needle tip placement was confirmed in multiple fluoroscopic    planes.  After negative aspiration, injection of 0.25 cc of Omnipaque    180 was injected into each facet joint revealing right L4-5 and L5-S1 facet    arthrograms.  There was no vascular uptake noted.  These were then injected    with 0.5 cc of Kenalog 40 mg/cc plus 1 cc of 1% lidocaine each.  There were    no complications.  Patient tolerated the procedure well.  Discharge    instructions given.  Patient instructed to use  ice liberally for 20 minutes    at a time; also instructed on post-injection relative rest x48 to 72 hours. 2. Continue Percocet 5 mg one p.o. b.i.d. as needed for pain, #60 without    refills. 3. Continue physical therapy for neutral spine lumbar stabilization exercise    program. 4. Patient to return to clinic in one month for reevaluation.  Patient was educated in the above findings and recommendations and understands.  There were no barriers to communication. Dictated by:   Sondra Come, D.O. Attending Physician:  Rolly Salter DD:  06/04/01 TD:  06/05/01 Job:  [id number]

## 2010-10-19 NOTE — H&P (Signed)
NAME:  [name] [name], [name] [name]                         ACCOUNT NO.:  [id number]   MEDICAL RECORD NO.:  [id number]                   PATIENT TYPE:  OUT   LOCATION:  XRAY                                 FACILITY:  [facility]   PHYSICIAN:  Celene Kras, MD                     DATE OF BIRTH:  04-28-1956   DATE OF ADMISSION:  09/21/2002  DATE OF DISCHARGE:  09/21/2002                                HISTORY & PHYSICAL   SUBJECTIVE:  The patient comes in to pain management today. I evaluated him  via health and history form of 14 point review of systems.  1. The patient has demonstrated excellent results after an intra-articular     facet injection and is referred to [place].  He has had declining functional     indices of quality of life indices secondary to recurrent pain, and  is     requesting movement forward in this regard. He wants to continue  to work     to improve  his  quality of life and  his overall endurance in functional     activities.  2. To this end I am going to ask him to maintain contact with primary care     for cigarette cessation which is best outcome.  3. I will go ahead and  inject the medial branch, as  he  has had an  intra-     articular injection. I think it is reasonable to clearly define  the     medial branch as a pain  generator as it may be amenable to     radiofrequency neuroablation. We will see him in followup after     injection. He is to assess this in the contexts of activities of  daily     living to assess functional enhancements.  4. I do not believe further imaging or diagnostics are warranted. I do think     we shall be very mindful of possibly obtaining consultants in followup to     rule out surgical position. He has no advancing neurological features,     and this is promising, and I discussed home-based therapy.  5. Review of medication.  Another rationale for performing injection is to     minimize escalation of  narcotic based pain medication. His pain  lateralizes to the right, consistent with L5-S1, 4-5, 3-4, with     contributory enervation to 2-3 and 1-2. He is consented for today's     procedure.   OBJECTIVE:  SPINE:  Objectively diffuse pain in the paralumbar and  myofascial discomfort in flexion/extension and lateral rotation pain. Pain  over PSIS and pain on extension. Gaenslen's and Patrick's equivocal. He has  no other overt neurological deficit, motor, sensory or reflex.   IMPRESSION:  Degenerative spinal disease lumbar spine, facet syndrome.   PLAN:  Facet medial branch injection L5-S1, 4-5,  3-4, with 2-3 as  contributory enervation. He is appropriately consented. Again lifestyle  enhancements are reinforced.   DESCRIPTION OF PROCEDURE:  The patient was taken fluoroscopy suite and  placed in the prone position and the back was prepped and draped in the  usual fashion. Using a 22 gauge spinal needle I advanced to the medial  branch of  L5-S1, 4-5, 3-4, 2-3 with contributory enervation addressed. I  confirmed placement. I then injected 1 cc of Lidocaine 1% MPF between each  level with a total of  40 mg of Aristocort in a divided dose.  He tolerated  the procedure well. There were no complications from the procedure.   DISPOSITION:  Discharge instructions were given. Will see him in followup  and predicate further injections based on need and overall response.                                              Celene Kras, MD   HH/MEDQ  D:  10/05/2002  T:  10/05/2002  Job:  [id number]

## 2010-10-19 NOTE — Consult Note (Signed)
[facility]  Patient:    [name] [name], STRANGE Visit Number: [id number] MRN: [id number]          Service Type: PMG Location: [facility]PC Attending Physician:  Sondra Come Dictated by:   Sondra Come, D.O. Proc. Date: 08/10/01 Admit Date:  06/05/2001   CC:         Desma Maxim, M.D.   Consultation Report  HISTORY:  Mr. Mcnealy returns to clinic today as scheduled for reevaluation.  I last saw him on July 27, 2001.  At that time I started him on an oral prednisone taper for suspected right cervical radiculopathy.  Mr. Wandell states that the steroids initially helped his right upper extremity and parascapular radicular pain but his pain seemed to return to baseline as the prednisone dose decreased and is now back at baseline.  He also states that he had some flu type symptoms with flushing in his face which he believes are secondary to the steroids.  He continues to have pain in his neck, upper back, and right upper extremity rating it a 9/10 on a subjective scale today.  He started Elavil 25 mg at bedtime which did not improve his sleep significantly and caused him to have a dry mouth.  He also has been taking Neurontin 400 mg t.i.d. without any significant improvement.  He has only taken one to two Percocet over the past few weeks and states that it does not help his pain.  I reviewed the health and history form and 14 point review of systems.  He also complains of pain in his right calf which he feels might be a muscle strain secondary to his recent work load given the significant tree damage around [place].  PHYSICAL EXAMINATION  GENERAL:  Healthy male in no acute distress.  VITAL SIGNS:  Blood pressure 140/86, pulse 75, respirations 16, O2 saturation 100% on room air.  NEUROLOGIC:  Manual muscle testing is 5/5 bilateral upper and lower extremities.  Sensory examination reveals decreased light touch in the medial aspect of the right arm and  parascapular region on the right.  Muscle stretch reflexes are 2+/4 bilateral biceps, triceps, brachioradialis, pronator tares, patellar, medial hamstrings, and Achilles.  Spurlings maneuver is positive on the right with paresthesias.  Patient also has a positive right upper extremity neural tension test.  There is tenderness to palpation over the right parascapular musculature.  There is also tenderness to palpation noted in the right calf locally reproducing patients symptoms.  This pain increases on passive dorsiflexion and resisted ankle plantar flexion.  IMPRESSION: 1. Small C5-6 disk herniation with right upper extremity radicular symptoms    with only transient improvement with oral steroid. 2. Degenerative disk disease of the cervical spine. 3. Mild spinal stenosis of the cervical spine. 4. Myofascial component. 5. History of rotator cuff impingement.  PLAN: 1. Thoroughly discussed treatment options with Mr. Golson.  At this point it    is reasonable at this time to pursue an interventional course to include    cervical epidural steroid injections.  We discussed this in detail.  Will    schedule patient to see Dr. Stevphen Rochester for these injections. 2. Continue Neurontin 400 mg t.i.d.  Will consider increasing this to effect    as needed and as predicated upon response to steroid injections. 3. Continue Ambien 10 mg at bedtime. 4. Discontinue Elavil at this time.  May reinitiate this following epidural    steroid injections. 5. Continue Percocet just as  needed for patients low back pain and other    musculoskeletal somatic pain complaints. 6. Patient to follow up with me after his cervical epidural steroid injection.  Patient was educated on the above findings and recommendations and understands.  No barriers to communication. Dictated by:   Sondra Come, D.O. Attending Physician:  Sondra Come DD:  08/10/01 TD:  08/11/01 Job: [id number]

## 2010-10-19 NOTE — Procedures (Signed)
[facility]  Patient:    [name] [name], [name] [name] Visit Number: [id number] MRN: [id number]          Service Type: END Location: ENDO Attending Physician:  Nelda Marseille Proc. Date: 03/27/01 Admit Date:  03/27/2001 Discharge Date: 03/27/2001   CC:         Desma Maxim, M.D.   Procedure Report  PROCEDURE:  Colonoscopy with biopsy.  INDICATION:  Patient with history of colon polyps, due for repeat screening. Consent was signed after risks, benefits methods and options thoroughly discussed in the office on multiple occasions  MEDICATIONS:  Demerol 80, Versed 8.  DESCRIPTION OF PROCEDURE:  Rectal inspection is pertinent for external hemorrhoids.  Digital exam was negative.  The video colonoscope was inserted and easily advanced around the colon to the cecum.  No obvious abnormality was seen on insertion.  The cecum was identified by the appendiceal orifice and the ileocecal valve.  There was some stool stuck to the wall of the cecum, but there seemed to be some underlying inflammation and erythema, and some cold biopsies were obtained.  We were unable to wash this stool off.  Prep was only fair, as we slowly withdrew back to the rectum.  We did have to do lots of washing and suctioning, but still there was stool stuck to the wall of the colon which could not be removed.  Tiny to small lesions could have missed but on slow withdrawal back to the rectum, other than an occasional left-sided diverticula, no polyps, masses, or other abnormalities were seen.  Once back in the rectum, the scope was retroflexed, revealing some small internal hemorrhoids.  The scope was straightened, air was withdrawn and the scope removed.  The patient tolerated the procedure well.  There was no obvious immediate complications.  ENDOSCOPIC DIAGNOSES: 1. Internal and external hemorrhoids. 2. Occasional left-sided diverticula. 3. Questionable cecal inflammation, status  post biopsied. 4. No polyps seen on exam to the cecum, but with the fair prep, tiny to small    lesions could have been missed.  PLAN:  Await pathology.  Yearly rectals and guaiacs per Dr. Arvilla Market.  Happy to see back p.r.n.  Otherwise probably recheck in five years.  Might need a more aggressive prep like with the GoLYTELY prep or even three Fleets bottles. Attending Physician:  Nelda Marseille DD:  03/27/01 TD:  03/29/01 Job: [id number]

## 2010-10-19 NOTE — Op Note (Signed)
NAME:  [name] [name], [name] [name] NO.:  [id number]   MEDICAL RECORD NO.:  [id number]          PATIENT TYPE:  AMB   LOCATION:  [facility]                         FACILITY:  [facility]   PHYSICIAN:  Sharolyn Douglas, M.D.        DATE OF BIRTH:  [birth year]   DATE OF PROCEDURE:  09/10/2005  DATE OF DISCHARGE:                                 OPERATIVE REPORT   DIAGNOSIS:  Lumbar degenerative disk disease with chronic back and lower  extremity pain, right greater than left.   PROCEDURE:  L4-5 epidural steroid injection with fluoroscopic guidance.   SURGEON:  Sharolyn Douglas, M.D.   ASSISTANT:  None.   ANESTHESIA:  MAC plus local.   COMPLICATIONS:  None.   INDICATIONS:  The patient is a pleasant 55 year old male with chronic back  and bilateral lower extremity pain, right greater than left.  He has failed  to respond to appropriate conservative measures.  He is considering surgery  and hoping to buy some time with an epidural steroid injection.  Risks and  benefits were reviewed.   PROCEDURE:  The patient was identified in the holding area.  He was taken to  the operating room.  He underwent sedation by the anesthesia department.  He  was turned prone with pillows under his abdomen.  The back was prepped and  draped in the usual sterile fashion.  Fluoroscopy was brought into the field  and the L4-5 level was identified fluoroscopically.  Lidocaine 1% 3 mL used  to anesthetize the skin.  An 18-gauge Tuohy needle then advanced into the  epidural space using the loss of resistance technique.  Aspiration was  performed and there was no blood or CSF.  Omnipaque 1 mL then injected.  There was good epidural spread.  Depo-Medrol 80 mg and 3 mL of 1% lidocaine  injected into the epidural space.  The Tuohy needle was removed.  A Band-Aid  placed.  The patient was turned supine.  He was transferred to recovery in  stable condition.  He was able to move his upper and lower extremities.  Post injection  instructions were given.  He will follow up in one week.  A  single fluoroscopic image was saved.      Sharolyn Douglas, M.D.  Electronically Signed     MC/MEDQ  D:  09/10/2005  T:  09/11/2005  Job:  [id number]

## 2010-10-19 NOTE — Discharge Summary (Signed)
[name] [name], NELLES NO.:  [id number]   MEDICAL RECORD NO.:  [id number]          PATIENT TYPE:  INP   LOCATION:  5035                         FACILITY:  [facility]   PHYSICIAN:  Sharolyn Douglas, M.D.        DATE OF BIRTH:  1956/03/07   DATE OF ADMISSION:  02/20/2006  DATE OF DISCHARGE:  02/23/2006                                 DISCHARGE SUMMARY   ADMITTING DIAGNOSES:  1. L4-S1 degenerative disc disease and spondylosis.  2. Depression.  3. Reflux.  4. History of MI in 2002 and 2005.   DISCHARGE DIAGNOSES:  1. Status post L4-S1 posterior spinal fusion.  2. Postoperative blood loss anemia.  3. Depression.  4. Reflux.  5. History of MI in 2002 and 2005.   PROCEDURE:  On February 20, 2006, patient was taken to the operating room  for an L4-S1 posterior spinal fusion with pedicle screws, L4-5 T lift and L4-  S1 laminectomy.  Surgeon was Sharolyn Douglas, assistant [employer], PA-C,  anesthesia used was general.   CONSULTATIONS:  None.   LABS:  CBC with diff preoperatively was within normal limits with the  exception of absolute monos of 0.8.  Postoperatively, H&H were monitored x3  days, reached a low of 11.3 and 33.3 on February 22, 2006.  PT/INR and PTT  were normal preoperatively.  Complete metabolic panel preop was normal with  the exception of glucose of 118.  Basic metabolic panel was monitored x2  days.  On February 21, 2006, calcium was 8.3, otherwise normal, and on  February 22, 2006, glucose was 121, BUN was 4, albumin was normal.  UA  preop was negative with the exception of a glucose of 100.  Blood typing  preop was O-positive, antibody screen was negative.  Urine culture preop  showed no growth.   EKG from February 17, 2006 shows sinus bradycardia unconfirmed on the  chart.   X-rays from September 23 show L4-S1 fusion, no evidence of any failure or  complicating features.  X-rays on September 20 show L4-5 and S1 pedicle  screws was used  intraoperatively.   BRIEF HISTORY:  Patient is a 55 year old male who has had a long history of  problems related to his back.  He has tried numerous types of conservative  treatments all without any lasting relief of his pain.  He said he is on  increasing doses of medications.  His function is decreasing.  Because of  his failure to improve and severe pain as well as his x-ray and MRI  findings, it was felt his best course of management would be an L4-S1  posterior final fusion.  Dr. Noel Gerold did discuss the risks and the benefits of  this procedure with the patient at length, he indicated understanding and  opted to proceed.   HOSPITAL COURSE:  On February 20, 2006, the patient was taken to the  operating room for the above-listed procedure.  He tolerated the procedure  well without any intraoperative complications and he was transferred to the  recovery room in stable condition.   Postoperatively, routine  orthopedic spine protocol was followed and he  progressed along very well.   Physical therapy and occupational therapy worked with the patient on a daily  basis and he made very good progress working on his progressive ambulation  program, brace use as well as his independence.   By February 23, 2006, patient was doing very well, he was independent and  safe with all therapy and mobility.  He was medically stable and ready for  discharge.   DISCHARGE PLAN:  Patient is a 55 year old male status post posterior spinal  fusion doing well, activity is daily ambulation program.  No lifting greater  than five pounds.  Back precautions at all times.  Daily dressing changes.  Keep incision dry until all drainage stops.  Follow up in two weeks  postoperatively with Dr. Noel Gerold.   MEDICATIONS ON DISCHARGE:  1. Percocet for pain.  2. Robaxin for muscle spasm.  3. Multivitamin daily.  4. Calcium daily.  5. Colace as needed.  6. Laxative as needed.  7. Avoid NSAIDs for two months.    DIET:  Regular home diet.   CONDITION ON DISCHARGE:  Stable and improved.   DISPOSITION:  Patient is being discharged to his home with home health  physical therapy and occupational therapy as well as family's assistance.      [name] [name], P.A.      Sharolyn Douglas, M.D.  Electronically Signed    CM/MEDQ  D:  04/10/2006  T:  04/10/2006  Job:  [id number]

## 2010-10-19 NOTE — Consult Note (Signed)
[facility]  Patient:    GUS, LITTLER Visit Number: [id number] MRN: [id number]          Service Type: PMG Location: TPC Attending Physician:  Sondra Come Dictated by:   Sondra Come, D.O. Proc. Date: 10/05/01 Admit Date:  09/15/2001                            Consultation Report  Mr. Pascua returns to clinic today as scheduled for reevaluation.  He was last seen on September 21, 2001 at which time I injected his left lateral epicondyle and he states that his pain has resolved.  Today he complains mainly of right shoulder pain.  He denies any neck or upper back pain but states that his right shoulder has been bothering him over the past few weeks.  He does have a history of impingement previously.  He also continues to complain of localized right calf pain which seems to improve when he walks.  He describes this as a cramp.  I reviewed the health and history form and 14 point review of systems. Patients pain level is a 5/10 on a subjective scale.  He denies any low back pain or neck pain at this time.  PHYSICAL EXAMINATION  GENERAL:  Healthy male in no acute distress.  EXTREMITIES:  Examination of the upper extremities does not reveal any atrophy of the muscles.  Range of motion of the shoulders is full bilaterally with pain at approximately 150 degrees of flexion and 110 degrees of abduction. There is a positive Hawkins, positive Neer, positive empty can tests with minimally positive OBrien and Yergason test.  There is tenderness to palpation at the biceps tendon.  Negative drop arm.  Examination of the right calf muscle reveals localized tenderness to palpation at the musculotendinous junction reproducing patients symptoms.  Straight leg raise is negative.  NEUROLOGIC:  No neurologic deficits in the upper and lower extremities.  IMPRESSION: 1. Right shoulder rotator cuff syndrome/impingement syndrome. 2. Right ______  tendinitis.  PLAN: 1. Long discussion with Mr. Liz regarding treatment options. 2. Right subacromial steroid injection.  Procedure was described to patient    including risks, benefits, limitations, and alternatives.  This was also    put in context of patients numerous other injections which include lumbar    facet injections, cervical epidural steroid injections, and left elbow    injection.  I discussed the possible active effect of steroids over time    which may cause osteoporosis possibly.  Patient understands the risks.  He    wishes to go ahead with the procedure.  Informed consent was obtained.    Skin was prepped in usual sterile fashion with Betadine and alcohol.  Right    subacromial space was injected with 1 cc of dexamethasone 4 mg/cc plus 1 cc    of 1% lidocaine plus 1 cc of 0.25% bupivacaine with a 25-gauge 1 1/2 inch    needle without complications.  Patient tolerated the procedure well.    Discharge instructions given.  Patient notes mild decrease in pain on    discharge. 3. Patient instructed to use ice, heat, and local massage to his right    ______ tendinitis.  Will consider physical therapy, although it is very    difficult for patient logistically to make physical therapy appointments    secondary to his current work schedule. 4. Patient to return to clinic in two weeks for  reevaluation. 5. Renew Percocet 5/325 mg one p.o. b.i.d. p.r.n. #60 without refills.  Patient was educated on the above findings and recommendations and understands.  There were no barriers to communication. Dictated by:   Sondra Come, D.O. Attending Physician:  Sondra Come DD:  10/05/01 TD:  10/06/01 Job: [id number]

## 2010-10-19 NOTE — H&P (Signed)
NAME:  [name] [name] [name], [name] [name] [name] NO.:  [id number]   MEDICAL RECORD NO.:  [id number]          PATIENT TYPE:  INP   LOCATION:  NA                           FACILITY:  [facility]   PHYSICIAN:  Sharolyn Douglas, M.D.        DATE OF BIRTH:  [birth year]   DATE OF ADMISSION:  07/03/2005  DATE OF DISCHARGE:                                HISTORY & PHYSICAL   CHIEF COMPLAINT:  Pain in my back and hip.   PRESENT ILLNESS:  This 55 year old gentleman seen by [place] progressive problems  concerning pain into his back with radiation into his right hip and some  into the left hip.  Occasionally he has numbness into the lower extremities.  The patient has had physical therapy, pain management, multiple injections  facet and epidural, rest, activity, and modification.  He has also undergone  a spine care treatment program.  The patient had a diskogram done on  November 2006 by Dr. Murray Hodgkins showing positive for L5-S1.  He is really quite  miserable and after much discussion including the risks and benefits of  surgery it is decided he would benefit from surgical intervention and is  being admitted for L5-S1 posterior spinal fusion.   The patient has a history of coronary artery disease with stents and has  been cleared for surgery by Dr. Eden Emms of [facility].   Examination of the lower extremities reveals 5/5 strength; however, he has  giveaway weakness on the right.  Reflexes are equal as well as sensation.  MRI and post discography CT scan show multiple degenerative changes in the  lumbar spine, but most significant L5-S1.   PAST MEDICAL HISTORY:  This gentleman has been in relatively good health  other than his heart for nearly all of his lifetime.  He had a heart attack  in September of 1999 and in [name] [name] of 2005.  He subsequently has undergone  stent implant which was done in 1999 and three stent implants done in 2005.  He has some mild reflux.   CURRENT MEDICATIONS:  Nexium, Vytorin, Prozac,  oxycodone, Ambien.  He did  not bring his list nor does he remember exactly how he takes these  medications.  He will take them to the hospital.   He is allergic to CODEINE.  Dr. Donia Guiles is his medical physician.   FAMILY HISTORY:  Positive for heart disease in the father, hypertension in  the mother, and diabetes in the father.   SOCIAL HISTORY:  Patient is married.  He is a Psychologist, sport and exercise.  He has no  intake of alcohol or tobacco products.  Does chew Nicorette Gum.  He has no  children.  Caregiver after surgery will be his spouse.   REVIEW OF SYSTEMS:  CNS:  No seizures, shoulder paralysis, double vision.  Patient does have radiculitis in the lower extremity.  CARDIOVASCULAR:  No  chest pain.  No angina.  No orthopnea.  RESPIRATORY:  No productive cough.  No hemoptysis.  No shortness of breath.  GASTROINTESTINAL:  No nausea,  vomiting, melena, or bloody stool.  GENITOURINARY:  No discharge, dysuria,  hematuria.  MUSCULOSKELETAL:  Primarily in present illness.   PHYSICAL EXAMINATION:  GENERAL:  Alert, cooperative, friendly 55 year old  white male who is accompanied by his wife.  VITAL SIGNS:  Blood pressure 116/76, pulse 72, respirations 12.  HEENT:  Normocephalic.  PERRLA.  Oropharynx is clear.  CHEST:  Clear to auscultation.  No rhonchi or rales.  HEART:  Regular rate and rhythm.  No murmurs are heard.  ABDOMEN:  Soft, nontender.  Liver, spleen not felt.  GENITALIA:  Not done.  Not pertinent to present illness.  RECTAL:  Not done.  Not pertinent to present illness.  EXTREMITIES:  Lower extremities as in present illness above.   ADMITTING DIAGNOSES:  1.  Degenerative disk disease L5-S1.  2.  Coronary artery disease.  3.  History of myocardial infarction.  4.  Gastroesophageal reflux disease.  5.  Dyslipidemia.   PLAN:  The patient will undergo L5-S1 posterior spinal fusion with pedicle  screws utilizing transforaminal lumbar interbody fusion with bone  morphogenic  protein.  All questions were encouraged and answered both from  him and his wife.  He will be fitted with an EBI lumbosacral type orthosis  by [employer] for this surgical procedure.      [name] [name] [name].      Sharolyn Douglas, M.D.  Electronically Signed    DLU/MEDQ  D:  06/28/2005  T:  06/28/2005  Job:  [id number]   cc:   Donia Guiles, M.D.  Fax: [phone]   Charlton Haws, M.D.  [phone] [address]  [address]  [place]  [place] [address]

## 2010-10-19 NOTE — Procedures (Signed)
NAME:  ISSAIAH, SEABROOKS NO.:  [id number]   MEDICAL RECORD NO.:  [id number]          PATIENT TYPE:  REC   LOCATION:  [facility]                          FACILITY:  [facility]   PHYSICIAN:  Celene Kras, MD        DATE OF BIRTH:  [birth year]   DATE OF PROCEDURE:  06/19/2004  DATE OF DISCHARGE:                                 OPERATIVE REPORT   Deshan Hemmelgarn [facility] to evaluate review  health and history form and 14-point review of systems.   Problem 1.  Ariana comes to [place] today and states that he has had significant  benefit from right-sided lumbar facet injection with radiofrequency neural  ablation.  The left side is most problematic.  It does not seem to be  significantly so, he still has adequate range of motion and functional  indices and he continues to work.   Problem 2.  His symptoms are consistent with MRI.  I have reviewed this with  him.  He has severe degenerative components in the lower lumbar segments  with a facet overlay.   Problem 3.  It is reasonable to inject his facet on the left side as opposed  to move forward with radiofrequency neural ablation to see how he does.  Follow up in about a month to a month and a half to determine further course  of care through Ranelle Oyster, M.D. evaluation.  Another rationale for  performing the procedure is to minimize escalation of controlled substances.  He is a very forthright individual, we will do what we can to help him.   Problem 4.  As a contributory element here, he has a little splinter on the  palmar aspect of his hand, removed with his permission.   OBJECTIVE:  Diffuse paralumbar myofascial discomfort, pain over PSIS, pain  with extension.  Left greater than right.  Right side seems to be improved.  Adequate range of motion.  No neurologic findings in motor, sensory, or  reflexes.   IMPRESSION:  Degenerative disk disease of the lumbar spine, facet syndrome.   PLAN:   Facet injection, left side L5-S1, L4-5, L3-4, L2-3, L1-2,  contributory enervation addressed, independent needle access points under  local anesthetics.  He has consented.   I do not necessarily plan another injection.  See how he does and follow up.   DESCRIPTION OF PROCEDURE:  The patient is taken to the fluoroscopy suite and  placed in the prone position.  The back is prepped and draped in the usual  sterile fashion.  Using a 22 gauge spinal anesthesia under local anesthetic,  I advanced to the facet at the medial branch L5-S1, L4-5, L3-4, L2-3, and L1-  2, contributory enervation address.  Independent needle access points.  Confirmed placement.  I then injected 1 mL of lidocaine 1% MPF at each level  with a total of 40 mg Aristocort in divided doses.   Tolerated the procedure well with no complications from our procedure.  Appropriate recovery.  Discharge instructions reviewed.       HH/MEDQ  D:  06/19/2004 11:06:05  T:  06/19/2004 11:22:59  Job:  [id number]

## 2010-10-19 NOTE — Consult Note (Signed)
[facility]  Patient:    [name] [name], [name] [name] Visit Number: [id number] MRN: [id number]          Service Type: PMG Location: [facility] Attending Physician:  Sondra Come Dictated by:   Sondra Come, D.O. Admit Date:  06/05/2001   CC:         Desma Maxim, M.D.   Consultation Report  HISTORY OF PRESENT ILLNESS:  The patient returns to the clinic today as scheduled for review of MRI of the cervical spine. He continues to complain of pain in his neck and right upper back radiating over his right deltoid region. The patient denies numbness or paresthesias. His MRI revealed a very mild broad based disk bulge at C4-5 without herniated disk or central stenosis. At C5-6, there is mild-to-moderate posterior spondylosis with a small-to-moderate central herniated nucleus pulposus creating mild central spinal stenosis and bilateral neural foraminal narrowing, right greater than left. At C6-67, there is mild posterior spondylosis with a very mild broad based disk bulge and mild bilateral neural foraminal narrowing. We discussed his MRI results and its possible correlation to his current pain level. We discussed treatment options. I review health and history form and 14-point review of systems. Pain is an 8-9/10 on a subjective scale with persistent decrease in function and quality of life. He continues to take Percocet 5 mg 2 times daily with modest relief. He is also continuing to take Vioxx 25 mg daily. He has taken Neurontin in the past for low back pain and has multiple Neurontin pills left. He has not taken it for his neck and upper extremities radicular symptoms.  PHYSICAL EXAMINATION:  GENERAL:  A healthy male in no acute distress.  VITAL SIGNS:  Blood pressure 116/74, pulse 72, respirations 18, O2 saturation 97% on room air.  EXTREMITIES:  Manual muscle testing is 5/5 bilateral upper extremities. Sensory examination is intact to light touch bilateral  upper extremities. Muscle stretch reflexes are 2+/4 bilateral biceps, triceps, brachioradialis, and pronator teres. There are myofascial trigger points in the right upper trapezius and levator scapulae. Range of motion of the right shoulder is not significantly painful today with minimally positive impingement.  IMPRESSION: 1. Small C5-6 disk herniation with right upper extremity radicular    symptoms. 2. Degenerative disk disease of the cervical spine with mild    spinal stenosis. 3. Myofascial component with above noted trigger points. 4. Minimal right shoulder impingement/rotator cuff signs today.  PLAN: 1. I had a thorough discussion with the patient regarding treatment    options. At this point, it is likely that the small cervical    disk herniation may be contributing significantly to his    upper back and right upper extremity pain. 2. Will begin a prednisone taper for 12 days to consist of 60 mg x2    days, 50 mg x2 days, 40 mg x2 days, 30 mg x2 days, 20 mg x2 days,    10 mg x2 days, then discontinue. 3. Prescription for physical therapy for cervical and scapular    stabilization exercises. Trial of intermittent cervical traction,    electrostimulation, and home exercise program 3 times per week    for 4 weeks. 4. The patient was instructed to resume Neurontin 300 mg 1 p.o. q.h.s.    x3 days, b.i.d. x3 days, then t.i.d. 5. If the patient does not gain considerable relief with the above    treatment intervention, will consider cervical epidural steroid injections.    This  will be based on the patients response and symptomatology. 6. The patient is to return to clinic in 2 weeks for reevaluation.  The patient was educated on the above findings and recommendations and understands. There were no barriers to communication. Dictated by:   Sondra Come, D.O. Attending Physician:  Sondra Come DD:  07/27/01 TD:  07/27/01 Job: [id number]

## 2010-10-19 NOTE — Cardiovascular Report (Signed)
[place]  Patient:    [name] [name], [name] [name]                      MRN: [id number] Proc. Date: 09/28/99 Adm. Date:  [id number] Disc. Date: [id number] Attending:  Veneda Melter CC:         Veneda Melter, M.D. [facility]             [facility]                        Cardiac Catheterization  PROCEDURES PERFORMED: 1. Selective coronary angiography. 2. Intravascular ultrasound of right coronary artery. 3. Perclose suture closure, right femoral artery.  DIAGNOSIS:  Coronary artery disease.  INDICATIONS:  Mr. Dolinger is a 55 year old gentleman with a known history of coronary artery disease who has undergo percutaneous intervention of the left circumflex artery in the past.  The patient has previously been enrolled in the REVERSAL study and presents now for follow-up of intravascular ultrasound of the right coronary artery.  TECHNIQUE:  After informed consent was obtained, the patient was brought to the cardiac catheterization lab, both groins were sterilely prepped and draped.  Lidocaine 1% was used to infiltrate the right groin, a 7 French sheath placed into the right femoral artery using the modified Seldinger technique.  The 7 [place] guide catheter with side holes was then used to engage the right coronary artery and selective angiography performed using manual injections of contrast.  Nitroglycerin 200 mcg was administered prior to angiography and angles were matched to the patients previous catheterization.  A 0.014 inch Hi-Torque Floppy wire was then advanced in the distal RCA and the IVUS catheter positioned in the distal vessel. Intravascular ultrasound was then performed to the RCA using automated pullback.  Repeat angiography was then performed after the administration of intracoronary nitroglycerin showing no distal vessel damage and excellent flow through the vessel.  The patient had mild bradycardia and hypertension with engagement of the IVUS  catheter that resolved after removal of the catheter. He tolerated the procedure well.  A Perclose suture closure device was then deployed to the right femoral artery until adequate hemostasis was achieved. The patient tolerated the procedure well and was transferred to the floor in stable condition. DD:  09/28/99 TD:  09/29/99 Job: [id number]

## 2010-10-19 NOTE — Procedures (Signed)
NAME:  [name] [name], [name] [name]                         ACCOUNT NO.:  [id number]   MEDICAL RECORD NO.:  [id number]                   PATIENT TYPE:  REC   LOCATION:  [facility]                                  FACILITY:  [facility]   PHYSICIAN:  Celene Kras, MD                     DATE OF BIRTH:  [birth year]   DATE OF PROCEDURE:  07/19/2003  DATE OF DISCHARGE:                                 OPERATIVE REPORT   Estes Lehner [facility].  I evaluated  him via the health and history form, and 14-point review of systems.   1. I reviewed Artrell's chart, progress date, performed an inventory of     steroid exposure and discussed treatment limitations and options.     Problematic is his decline in functional indices and quality of life     indices secondary to pain.  He relates pain in the infragluteal region as     well as a right directed pain, paralumbar, with a radicular component.  I     blocked his facets and he has had a good release cycling, we performed     RF.  He continues to break through. He is describing more of a radicular     component today.  2. Rather than go ahead and proceed with a facet injection at this time, I     am going to go ahead and at least trial a transforaminal injection.  He     describes a 5-1 pathology, and I reviewed available imaging.  It is     reasonable to trial this, as his pain is consistent here, and this will     be a diagnostic and therapeutic maneuver.  3. Again cigarette cessation for best outcome.  4. I really do not want to be doing a lot more injections, but certainly we     want to keep this otherwise young vital individual as functional as     possible.  He may be a candidate for further radiofrequency neural     ablation down the time as this did help him; but, I think, that we     probably have an anterior and posterior compartment element.  That being     the facet and the nerve root.   I do not think that we need another  surgical assessment at this time, but we  will certainly keep this in the mix.   I reviewed his medication.  He is appropriate.  He uses it sparingly and  appropriately and another rationale for performing injection is to minimize  escalation in narcotic based pain medication.   OBJECTIVE:  Diffuse paralumbar myofascial discomfort.  Gaenslen's and  Patrick's equivocal.  Pain over PSIS notable pain on extension.  Straight  leg lift impaired; right greater than left.  EHL is fine.  No advancing  pseudomotor changes,  vascular integrity noted and intact.   IMPRESSION:  Radiculopathy, degenerative spine disease of lumbar spine.   PLAN:  Transforaminal epidural injection, L5, right side.  He has consented.   The patient taken to the fluoroscopy suite and placed in the prone position  and the back prepped and draped in the usual fashion.  Using a 22-gauge  blunted needle, I advanced into the transforaminal epidural space in the  right side and I confirmed placement in multiple fluoroscopic positions.  I  used Isovue 200.  In withdrawing needle technique, I injected 2 cc of  Marcaine 0.25% NPF and 40 mg of Aristocort.   He tolerates the procedure well.  No complications from our procedure.  Discharge instructions given.  Life style enhancements reviewed.  Will see  primary care for cigarette cessation for best outcome position.  Will see  him in follow up.                                                Celene Kras, MD    HH/MEDQ  D:  07/19/2003 11:04:03  T:  07/19/2003 11:42:47  Job:  [id number]

## 2010-10-19 NOTE — Consult Note (Signed)
NAME:  [name] [name], [name] [name]                         ACCOUNT NO.:  [id number]   MEDICAL RECORD NO.:  [id number]                   PATIENT TYPE:  REC   LOCATION:  [facility]                                  FACILITY:  [facility]   PHYSICIAN:  Zachary Dugan, DO                      DATE OF BIRTH:  Nov 10, 1955   DATE OF CONSULTATION:  08/19/2002  DATE OF DISCHARGE:                                   CONSULTATION   HISTORY OF PRESENT ILLNESS:  The patient returns to the clinic today for  reevaluation.  He was last seen on April 19, 2002.  His main complaints  today are recurrence of low back pain similar to his previous back pain  which was relieved with right L4-5 and L5-S1 facet joint injections.  He  also complains of right anterior shoulder pain which has been recurrent.  In  the interim the patient states he had right elbow surgery per Dr.  Metro Kung in January 2004, which has been helpful.  His pain today is 7/10  on a subjective scale.  He describes his pain as achy, burning, and  constant.  He does note pain radiating from his lower back into his right  lower extremity, similar to previous symptoms.  He denies any significant  pain in his right shoulder with reaching up into the cupboards, but he does  have some pain with lifting objects from the floor or waist level.  I  reviewed the health and history form and 14-point review of systems.   PHYSICAL EXAMINATION:  GENERAL:  Healthy male in no acute distress.  VITAL SIGNS:  Blood pressure is 122/67, pulse is 72, respirations 18, O2  saturation 98% on room air.  BACK:  Examination of the patient's back reveals level pelvis without  scoliosis.  There is tenderness to palpation of the right lumbar paraspinal  muscles.  Range of motion of the lumbar spine is essentially full.  The  patient has most pain on extension as well as extension coupled with right  rotation reproducing his lower back pain.  EXTREMITIES:  Examination of the upper extremities  reveals full range of  motion at the shoulders bilaterally.  Negative Hawkins, Neer, empty can,  Speed's, and O'Brien tests.  The patient does have significant tenderness to  palpation over the right biceps tendon reproducing his symptoms.  Neurologically intact in the upper and lower extremities including motor,  sensory, and reflexes.   IMPRESSION:  1. Lumbar facet arthropathy.  2. Recurrent low back pain.  3. Right biceps tendinitis.  4. Status post right elbow surgery.   PLAN:  1. Discussed treatment options with the patient.  It is reasonable to     proceed with repeat lumbar facet injections.  The patient last had lumbar     facet injections in January 2003, with significant relief of his     symptoms.  He wishes to proceed with that today, which is reasonable.  2. Instructed the patient to start using ice three times per day for 20     minutes at a time over his right biceps tendon.  If symptoms are not     improving, would consider local steroid injection.  We have tried     physical therapy with ultrasound in the past for this without any     improvement.  I discussed the possible risks, benefits, limitations,     alternatives, and potential side effects with injecting steroid around     the biceps tendon.  The patient would need to rest his upper extremity     for approximately one week following the injection if we proceed in this     fashion.  3. Continue oxycodone 5 mg/325 mg as needed.  The patient has been taking     this very sparingly.  4. Patient to return to clinic in one week for reevaluation if needed.     Would consider repeat facet joint injections in two weeks if symptoms are     not improved.   PROCEDURE:  Right L4-5 and L5-S1 facet joint injections:  The procedure was  described to the patient in detail including risks, benefits, limitations,  alternatives, and potential side effects.  The risks include bleeding,  infection, nerve injury, spinal headache,  paralysis, failure to relieve  pain, increased pain, allergic reaction to medications.  The patient wishes  to proceed.  Informed consent was obtained.  The patient was brought back to  the fluoroscopy suite and placed on the table in prone position.  The skin  was prepped and draped in the usual sterile fashion.  The skin and  subcutaneous tissues were anesthetized with 2 mL of 1% lidocaine at two  independent needle access points.  Under direct fluoroscopic guidance, a 22-  gauge, 3-1/2-inch spinal needle was advanced in the inferior recess of the  right L5-S1 facet joint under multiple fluoroscopic views.  Needle was also  advanced into the L4-5 facet joint using independent needle access point  under multiple fluoroscopic views.  Needle tip placement was confirmed at  each site after aspiration with injection of a small amount of Omnipaque 180  revealing right L4-5 and L5-S1 facet joint arthrograms.  Each joint was then  injected with 0.5 mL of Kenalog 40 mg/mL plus 1 mL of preservative-free 1%  lidocaine.  The patient tolerated the procedure well.  Discharge  instructions were given.  There were no complications.  The patient's pain  score was 0/10 on discharge.  He was released in stable condition.   The patient was educated on the above findings and recommendations and  understands.  There were no barriers to communication.                                               Zachary Desmund, DO    JW/MEDQ  D:  08/19/2002  T:  08/20/2002  Job:  [id number]

## 2010-10-19 NOTE — Consult Note (Signed)
[facility]  Patient:    [name] [name], [name] [name] Visit Number: [id number] MRN: [id number]          Service Type: PMG Location: [facility] Attending Physician:  Rolly Salter Dictated by:   Sondra Come, M.D. Proc. Date: 04/15/01 Admit Date:  02/03/2001                            Consultation Report  HISTORY OF PRESENT ILLNESS:  [name] returns to clinic today for trigger point injections of his left upper back and periscapular musculature.  He was seen two days ago and had right levator scapulae and trapezius trigger point injections done.  He returns today for left levator scapulae and trapezius trigger point injections.  He denies any new neurologic symptoms.  I review health and history form and 14-point review of systems.  Patients current pain level is 7-8/10 on a subjective scale.  His function and quality of life remains improved and his sleep is also improved.  PHYSICAL EXAMINATION:  GENERAL:  Healthy male in no acute distress.  VITAL SIGNS:  Blood pressure 133/83, pulse 76, respirations 16, O2 saturation is 98% on room air.  NEUROLOGIC:  No new neurologic findings including motor, sensory and reflexive.  PALPATORY:  Exam reveals trigger points in the left levator scapulae and upper trapezius.  IMPRESSION: 1. Myofascial upper back pain, bilaterally.  Patient has had some relief with    the right upper back trigger point injections. 2. Facet arthropathy with low back pain.  This has been improved with    intra-articular facet injections.  PLAN: 1. Trigger point injections left upper back in the above noted trigger points.    Procedure was described to patient and informed consent was obtained.  The    skin was prepped in the usual sterile fashion.  Each trigger point was    injected with 1 cc of 1% Lidocaine using a 25-gauge 1-1/2 inch needle    without complication.  Patient tolerated the procedure well.  Discharge    instructions  given. 2. Continue current medications. 3. Patient to return to clinic for re-evaluation as scheduled on May 06, 2001.  Patient was educated in the above findings and recommendations, and understands.  There were no barriers to communication. Dictated by:   Sondra Come, M.D. Attending Physician:  Rolly Salter DD:  04/15/01 TD:  04/15/01 Job: [id number]

## 2010-10-19 NOTE — Procedures (Signed)
[facility]  Patient:    [name] [name], [name] [name] Visit Number: [id number] MRN: [id number]          Service Type: Attending:  Jewel Baize. Stevphen Rochester, M.D. Dictated by:   [name], M.D. Proc. Date: 09/02/01                             Procedure Report  Milferd [facility]sell [facility]. I evaluated and reviewed his health and history form and 14 point review of systems.  #1 -  Nikolos comes in today stating that the cervical epidural helped him a great deal. To this end, I think it is reasonable to go ahead and proceed with the third in a series of cervical epidural and delay the cervical facetal injection to a later date. 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  #2 - Reviewed his medications, he is appropriate.  #3 - No advancing neurological features. I do not believe further imaging or diagnostics are warranted.  #4 - Should he breakthrough, we would move forward with facet injection with positive provocative block leading [place] to consideration for radiofrequency neural oblation.  Objectively, he has diffuse subparacervical myofascial discomfort, impaired flexion, extension and lateral rotational pain, positive cervical facetal compression test, left greater than right but no new neurological features, motor, sensory or reflex.  IMPRESSION:  Cervicalgia, degenerative spinal disease of the cervical spine.  PLAN:  Cervical epidural, he has consent.  DESCRIPTION OF PROCEDURE:  The patient taken to the fluoroscopy suite, placed in upright position, neck prepped and draped in the usual fashion. Using a Hustead needle advanced to the C5-6 interspace, no evidence of CSF, heme or paresthesia. Test block uneventfully followed 40  mg of Aristocort and flushed the needle.  The patient tolerated the procedure well, no complications from our procedure, discharge instructions given. Extensive consultation as to overall expectations. Dictated by:   [name], M.D. Attending:  Jewel Baize. Stevphen Rochester, M.D. DD:  09/01/01 TD:  09/02/01 Job: [id number]

## 2010-10-19 NOTE — Procedures (Signed)
[facility]  Patient:    [name] [name], [name] [name] Visit Number: [id number] MRN: [id number]          Service Type: PMG Location: [facility] Attending Physician:  Sondra Come Dictated by:   Celene Kras, M.D. Proc. Date: 08/11/01 Admit Date:  06/05/2001                             Procedure Report  1. [name] [name] [facility] today for scheduled    cervical epidural.  Risks, complications, and options are fully outlined.    I review the chart. I review progress to date.  He is informed of our    overall expectations and problematic features with cervical epidural as    well as expectations as to series. 2. We review imaging reports.  Objectively, he has diffuse paracervical myofascial discomfort.  Impaired flexion and extension, and lateral rotational pain positive.  Cervical facetal compression test left greater than right.  No other overt neurologic deficit, motor, sensory, and reflex.  IMPRESSION: 1. Cervicalgia. 2. 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  PLAN:  Cervical epidural.  He is consented.  DESCRIPTION OF PROCEDURE:  Fluoroscopic imaging to identify appropriate anatomy.  We then prep and drape the neck in the usual fashion.  Using a Hustead needle, advance to the C6-7 interspace without any evidence of CSF, blood, or paresthesia.  Test block uneventfully.  Follow with 40 mg of Aristocort and flush the needle.  Tolerates the procedure well.  No complications from our procedure.  Predicate further injection based on need and response.  Discharge instructions given. Dictated by:   Celene Kras, M.D. Attending Physician:  Sondra Come DD:  08/11/01 TD:  08/11/01 Job: [id number]

## 2010-10-19 NOTE — Consult Note (Signed)
NAME:  GLENNIS, MONTENEGRO                         ACCOUNT NO.:  [id number]   MEDICAL RECORD NO.:  [id number]                   PATIENT TYPE:  REC   LOCATION:  [facility]                                  FACILITY:  [facility]   PHYSICIAN:  Sondra Come, D.O.                 DATE OF BIRTH:  09/30/1955   DATE OF CONSULTATION:  DATE OF DISCHARGE:                  PHYSICAL MEDICINE & REHABILITATION CONSULTATION   HISTORY OF PRESENT ILLNESS:  Mr. Pressley returns to clinic today as scheduled  for reevaluation.  He was last seen on November 30, 2001.  He has not noticed  any significant change in the interim.  He continues to have some mild  discomfort in his right shoulder, but overall is improved.  He has not been  to physical therapy as his work schedule does not afford him the time.  He  denies any further low back pain, although he states that he does feel  occasional popping and clicking in his right lumbosacral region.  He has  undergone lumbar facet injections at L5-S1 with resolution of his low back  pain.  Continues to have paraesthesias in his right proximal arm over the  biceps without any associated weakness.  I review health and history form  and 14 point review of systems.  The patient's pain is a 3-4/10 on a  subjective scale.  Function and quality of life indexes have improved  significantly.  He states that the Ultracet did not seem to help him but he  was only taking one pill at a time.  I had supplied him with some samples  which he took.  He did not actually fill the prescription yet.  He also has  continued to take Percocet occasionally.  We discussed getting him off the  narcotic based pain medicine and move towards nonnarcotic alternatives.   PHYSICAL EXAMINATION:  GENERAL:  Healthy male in no acute distress.  VITAL SIGNS:  Blood pressure 161/89, pulse 81, respirations 12, O2  saturation 100% on room air.  EXTREMITIES:  Examination of the upper extremities does not reveal any  atrophy.  There is full range of motion bilateral shoulders.  Negative  impingement signs on the right.  NEUROLOGIC:  Manual muscle testing is 5/5 bilateral upper and lower  extremities.  Sensory examination is intact to light touch bilateral upper  and lower extremities at this time.  Muscle stretch reflexes are symmetric  bilateral upper and lower extremities.  Spurling maneuver is negative to the  right.  BACK:  There is minimal tenderness to palpation bilateral upper back  and parascapular muscles with no active trigger points noted.   IMPRESSION:  1. Right shoulder rotator cuff syndrome/impingement syndrome, improved.  2. Lumbar facet arthropathy with resolved low back pain.  3. Cervicalgia, improved.   PLAN:  1. Continue to discuss treatment options with Mr. Kruse.  At this point I     have instructed him to fill his Ultracet  prescription and take two p.o.     t.i.d. as needed for pain.  Will not refill Percocet at this time as we     are going to move towards nonnarcotic alternatives.  2. Encouraged patient to get involved in a physical therapy program.  3. Consider repeat lumbar facet injection if patient's back pain should     reoccur.  4. Continue Vioxx.  5. Resume glucosamine and chondroitin.  6. The patient is to return to clinic in three months or sooner if needed.   The patient was educated on the above findings and recommendations and  understands.  There were no barriers to communication.                                               Sondra Come, D.O.    JJW/MEDQ  D:  12/28/2001  T:  01/01/2002  Job:  [id number]   cc:   Desma Maxim, M.D.

## 2010-10-19 NOTE — Consult Note (Signed)
NAMEJAVID, [name] NO.:  [id number]   MEDICAL RECORD NO.:  [id number]                    PATIENT TYPE:   LOCATION:                                       FACILITY:   PHYSICIAN:  Sondra Come, D.O.                 DATE OF BIRTH:  1955/07/18   DATE OF CONSULTATION:  DATE OF DISCHARGE:                                   CONSULTATION   HISTORY OF PRESENT ILLNESS:  [name] [name] returns to the clinic today as  scheduled for re-evaluation. He was last seen on February 15, 2002. In the  interim, he has had intermittent pain in his forearms bilaterally secondary  to lateral epicondylitis. His symptoms have really been waxing and waning.  He does get some relief with Ultracet, which he takes one to two per day. He  has essentially stopped taking Percocet, as it has not been very effective  for him. He has been using ice, heat, and the elbow strap, which has been  giving him some relief. He states that occasionally, he will just start a  weed eater or a leaf blower and have excruciating pain in his right lateral  epicondylar region. He got significant relief with the steroid injection.  However, they were only transient. Overall, his function and quality of life  indices have improved. He has not gone to PT secondary to hectic work  schedule, although during the winter months, he should have some time for  convalescence and possibly even therapy. We discussed further treatment  options. His pain today is a 5/10 on a subjective scale. I reviewed the  health and history form on a 14 point review of systems.   PHYSICAL EXAMINATION:  GENERAL: Healthy male in no acute distress.  VITAL SIGNS: Blood pressure 126/71, pulse 89, respiratory rate 16, O2 sat  97% on room air.  EXTREMITIES: Examination of upper extremities does not reveal any atrophy.  Manual muscle testing is 5/5 bilateral upper extremities. Sensory  examination is intact to light touch bilateral upper  extremities with the  exception of a tingling feeling in the radial forearm. Tinel is equivocal  over the posterior nerve. Negative reproduction of pain with resisted middle  finger extension. Muscle stretch reflexes are 2+/4 bilateral upper  extremities. Palpatory examination reveals significant tenderness to  palpation over the right lateral epicondyle and minimal tenderness on the  left lateral epicondyle.   IMPRESSION:  Bilateral lateral epicondylitis.   PLAN:  1. Mr. Sassone and I continued to have discussion over further treatment     options in this regard. 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 If     symptoms are not improving, would consider Sanorex versus a trial of     proliferative therapy. We discussed this at length  and he will give     consideration.  2. Continue Ultracet as needed.  3. Continue ice, heat, and elbow strap.  4. The patient is to return to the clinic on an as needed basis.   The patient was educated in the above findings and recommendations and  understands. No barriers to communication.                                                Sondra Come, D.O.    JJW/MEDQ  D:  04/19/2002  T:  04/20/2002  Job:  [id number]   cc:   Donia Guiles, M.D.  [address] [place]  [place] [address]  Fax: [phone]

## 2010-10-19 NOTE — Procedures (Signed)
NAME:  [name] [name], ALEMAN NO.:  [id number]   MEDICAL RECORD NO.:  [id number]          PATIENT TYPE:  REC   LOCATION:  [facility]                          FACILITY:  [facility]   PHYSICIAN:  Celene Kras, MD        DATE OF BIRTH:  02/26/1956   DATE OF PROCEDURE:  03/05/2005  DATE OF DISCHARGE:                                 OPERATIVE REPORT   Aryaman Haliburton [facility] to review health  and history form, 14-point review of systems are reviewed with the record,  progress to date, available imaging.   Mr. Hammitt was put on the Vax-D machine and I think something is happening  to his back.  I don't know if it is a distraction problem at the facet or  disk, but he is side-bending and he is almost in tears.  Significant  paralumbar pain.  I don't identify anything new on neurological or  musculoskeletal assessment.  This is his typical pain, particularly to his  facet, and he is a responder to interventional techniques.  I will go ahead  and reinforce.   Other modifiable features and old profile discussed.  He has quite smoking,  a very forthright individual, and he will continue on his current control  substance load provided by our physiatry colleagues.   I think a facet joint injection is warranted, at the medial branch to  defervesce him.  We will consider reinvestigating whether we want to go  toward RF again.  I also need to get another MRI of his lumbar spine.  He  has significant pain, and this is going to need to be investigated.   Objectively, pain over PSI is the notable pain extension site bending  positive gains and Patrick's equivocal.  He has no new neurological  findings.  Motor, sensory, reflexive, mostly extensive myofascial pain,  decreased range of motion clearly in pain.   IMPRESSION:  Facet center degenerative spinal disease, lumbar spine.   PLAN:  Facet injection at the medial branch, L5-S1, 4-5, 3-4, 2-3, right and  left  side, independent needle access points, contributory enervation  addressed.   I will see him back in about 2 to 3 weeks and determine further course of  care based on enhanced database.  MRI ordered.   He will let us know if there is any further problems.  No barriers to  communication.   He has consented.   The patient is taken to the fluoroscopy suite and placed in prone position  and back prepped and draped in the usual sterile fashion.  Under local  anesthetic, under direct fluoroscopic observation, I advanced the 22 gauge  spinal needle up to the facet at the medial branch L5-S1, 4-5, 3-4, 2-3  right and left side.  Independent needle access points, contributory  enervation addressed.  Test block uneventfully followed with 1 mL of  lidocaine and 1% MPF at each level with a total of 40 mg of Aristocort in  divided dose.   He tolerated this procedure well.  No complications of the  procedure.  Appropriate recovery.  Discharge instructions given.  No barrier to  communication.           ______________________________  Celene Kras, MD     HH/MEDQ  D:  03/05/2005 10:40:28  T:  03/05/2005 12:15:28  Job:  [id number]

## 2010-10-19 NOTE — Op Note (Signed)
NAME:  [name] [name], [name] [name]                         ACCOUNT NO.:  [id number]   MEDICAL RECORD NO.:  [id number]                   PATIENT TYPE:  OUT   LOCATION:  XRAY                                 FACILITY:  [facility]   PHYSICIAN:  Celene Kras, MD                     DATE OF BIRTH:  09/19/1955   DATE OF PROCEDURE:  10/26/2002  DATE OF DISCHARGE:  09/21/2002                                 OPERATIVE REPORT   [name] [name] comes in for pain management today.  I evaluated him via  health and history form 14-point review of systems.   1 - [name] [name] has clearly demonstrated a positive provocative block to the  right side, medial branch, superior to previous injections.  I don't think  we need to proceed with another, he has demonstrated improved range of  motion, near one and a half week of almost completely diminution in pain  perception, and has recrudesced.  Our next logical step was to move forward  with radiofrequency neuroablation based on this information.   2 - He remains a very vital individual.  He is working.  He has a very  laborious job and I discussed home based therapy and lifestyle enhancements  to continue with his attempts at employment, and another rationale for  performing this procedure is to minimize escalation of narcotic based pain  medication.  I reviewed these medications, and their risks.   3 - He will follow up with [place] in approximately one to two months.  I am not  sure we need to do the left side.  The right side seems to be the most  problematic and predicate any further injections based on need and overall  response.  He is also instructed to maintain contact with primary care.   OBJECTIVE:  Diffuse paralumbar myofascial discomfort, impaired flexion and  extension and lateral rotation.  Gaenslen's and Patrick's equivocal.  Pain  over PSIS.  Most myofascial pain directed rightward.  He has no known  neurological findings.  Motor sensory reflexive.   IMPRESSION:   Degenerative spine disease, lumbar spine, facet syndrome.   PLAN:  Facet medial branch neurotomy, right side, L5-S1, L4-5, L3-4, L2-3  with 2-3 as contributory enervation.  He understands the potential risks of  this procedure including bleeding, infection, nerve damage, stroke, seizure,  death and other unforeseen problems that may arise.  He is appropriately  consented.   We reviewed risks of hydrocodone inclusive of its potential habituating  nature, cautions to driving, working and making important decisions and he  accepts.  Review of patient care agreement.  He is consented.   DESCRIPTION OF PROCEDURE:  Patient taken to fluoroscopy suite and placed in  prone position.  Back prepped and draped in the usual fashion.  IV in place  with appropriate Versed sedation and monitoring.  Appropriate recovery.  We  then advanced under  direct fluoroscopic observation a 22 gauge 5 mm active  tip RF needle to appropriate landmarks and appropriate stimulation motor and  sensory.  I confirmed placement at multiple points during this procedure.  I  then injected 1mL of lidocaine 1% PF at each level with a total of 40 mg of  Aristocort in divided dose.   Lesion was performed at 70 degrees for 70 seconds at each level.  The  patient tolerated the procedure well.  No complications from our procedure.  Discharge instructions given.  We will see him in follow up.  Appropriate  recovery.  No complications at discharge.                                               Celene Kras, MD    HH/MEDQ  D:  10/26/2002  T:  10/26/2002  Job:  [id number]

## 2010-10-19 NOTE — Consult Note (Signed)
NAME:  [name] [name], [name] [name]                         ACCOUNT NO.:  [id number]   MEDICAL RECORD NO.:  [id number]                   PATIENT TYPE:  REC   LOCATION:  [facility]                                  FACILITY:  [facility]   PHYSICIAN:  Sondra Come, D.O.                 DATE OF BIRTH:  12-Aug-1955   DATE OF CONSULTATION:  02/15/2002  DATE OF DISCHARGE:                                   CONSULTATION   The patient returns to clinic today sooner than scheduled secondary to  exacerbation of bilateral lateral epicondylitis.  He complains of right  greater than left elbow pain pointing to his lateral epicondylar region.  This has been going on for two weeks progressively.  He denies any trauma.  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  He has been using ice with only temporary relief.  I  injected his left elbow September 21, 2001 with nearly complete resolution of  his symptoms.  He notes that he had had over the past five years probably  four injections in his left elbow and two to three in his right elbow.  The  patient would like to have more injections into his elbows and we discussed  this.  Certainly, we would like to avoid excessive steroid exposure in this  regard and I discussed the risks, benefits, limitations, and alternatives to  this.  In addition, we discussed physical therapy which Mr. Power has had a  difficult time lately getting into secondary to his busy work schedule.  I  counsel him in this regard.  The patient denies any numbness or  paraesthesias in the upper extremities.  I review health and history form  and 14 point review of systems.   PHYSICAL EXAMINATION:  GENERAL:  Healthy male in no acute distress.  VITAL SIGNS:  Blood pressure 129/72, pulse 90, respirations 20, O2  saturation 98% on room air.  EXTREMITIES:  No heat, erythema, or edema in the upper extremities.  Neurovascularly intact bilateral upper extremities with  some mild pain  inhibition on manual muscle testing, especially with resisted wrist  extension and finger abduction.  There is exquisite tenderness to palpation  bilateral lateral epicondyles at the tendinous junction.   IMPRESSION:  Bilateral lateral epicondylitis.   PLAN:  1. I again had a long discussion with the patient regarding treatment     options.  At this point it is reasonable to proceed with bilateral     lateral epicondylar steroid injections, although with caution.  Again, I     described the risks, limitations, and alternatives to the patient and he     wishes to proceed.  2. Will get the patient involved in physical therapy program for range of     motion modalities, progressive resistive exercise, and a home exercise     program.  This will be for two to three times per week for three to four     weeks.  I stressed the importance of a therapy program.  3. Consider SonoRx referral if symptoms are not improving.  Would like to     avoid further steroid exposure if possible.  4. The patient is to return to clinic in two months for reevaluation.   PROCEDURE:  Bilateral lateral epicondylar steroid injections.  Procedure was  described to patient in detail including risks, benefits, limitations, and  alternatives.  The patient wishes to proceed.  Informed consent was  obtained.  Skin was prepped in usual sterile fashion with Betadine and  alcohol swabs.  Each elbow was injected with 0.25 cc of Kenalog 40 mg per cc  plus 0.75 cc of 1% lidocaine using a 25-gauge 1.25 inch needle.  There were  no complications.  The patient tolerated the procedure well.  Post injection  patient notes improvement in his symptoms.   The patient was educated on the above findings and recommendations and  understands.  No barriers to communication.                                               Sondra Come, D.O.    JJW/MEDQ  D:  02/15/2002  T:  02/15/2002  Job:  [id number]   cc:   Desma Maxim, M.D.

## 2010-10-19 NOTE — Consult Note (Signed)
NAME:  [name] [name], [name] [name]                         ACCOUNT NO.:  [id number]   MEDICAL RECORD NO.:  [id number]                   PATIENT TYPE:  REC   LOCATION:  [facility]                                  FACILITY:  [facility]   PHYSICIAN:  [name] Nemesio, DO                      DATE OF BIRTH:  25-Sep-1955   DATE OF CONSULTATION:  09/01/2002  DATE OF DISCHARGE:                                   CONSULTATION   REFERRING PHYSICIAN:  Dr. Donia Guiles.   REASON FOR CONSULTATION:  The patient returns to clinic today for  reevaluation.  He was last seen on August 19, 2002, at which time he  underwent right L4-5 and L5-S1 facet joint injections for chronic low back  pain with lumbar facet arthropathy.  The patient states that he has had  approximately 30% relief of his lower back pain and requests repeat  injections.  He also continues to complain of right anterior shoulder pain  and has been using ice and heat with only temporary relief.  He has not had  any imaging studies of his right shoulder and has had right shoulder pain on  and off over the past few years.  His pain today is a 6-7/10 on a subjective  scale.  Function and quality-of-life indices have improved.  He continues on  Vioxx 25 mg daily and oxycodone 5 mg one to two per day.  I reviewed health  and history form and 14-point review of systems.   PHYSICAL EXAMINATION:  GENERAL:  Physical examination reveals a healthy male  in no acute distress.  VITAL SIGNS:  Blood pressure is 134/81, pulse 71, respirations 20, O2  saturation 99% on room air.  NEUROMUSCULAR:  Examination of the back reveals tenderness to palpation in  the right lumbar paraspinal muscles.  Range of motion is guarded, especially  with extension and rotation plus extension.  No new neurologic findings in  the lower extremities including motor, sensory and reflexes.  Examination of  the right shoulder reveals tenderness to palpation over the right biceps  tendon.  There is also  pain-limited range of motion, especially with  abduction and flexion.  Hawkins', Neer and empty can tests are equivocal  today.  No neurologic deficits in the right upper extremity.   IMPRESSION:  1. Lumbar facet arthropathy.  2. Recurrent low back pain.  3. Right rotator cuff syndrome.   PLAN:  1. I discussed further treatment options with the patient.  It is reasonable     to proceed with repeat right L4-5 and L5-S1 facet joint injections.  I     anticipate that we will get longer-term relief following a second set of     injections today.  2. Continue Vioxx, but increase to 25 mg b.i.d. x5 days, then back to daily     dosing.  3. Continue Percocet 5/325 mg  one p.o. b.i.d. as needed, #60 without     refills.  4. We will obtain MRI of the right shoulder with and without contrast to     rule out rotator cuff tear versus labral tear.  5. Consider orthopedic referral.  6. Patient to return to clinic after MRI for further discussion and     management.   PROCEDURE:  Right L4-5 and L5-S1 facet joint injections.   INFORMED CONSENT:  Procedure was described to patient in detail including  the risks, benefits, limitations and alternatives and potential side-  effects.  Risks include, but are not limited to, bleeding, infection, nerve  injury, spinal headache, paralysis, failure to relieve pain, increased pain  and allergic reaction to medication.  Patient wishes to proceed.  Informed  consent was obtained.   DESCRIPTION OF PROCEDURE:  Patient was brought back to the fluoroscopy suite  and placed on the table in prone position.  Skin was prepped and draped in  the usual sterile fashion.  Skin and subcutaneous tissues were anesthetized  with 2 mL of 1% lidocaine at two independent needle access points.  Under  direct fluoroscopic guidance, a 22-gauge 3-1/2-inch spinal needle was  advanced into the right L5-S1 and L4-5 facet joints using independent needle  access points.  Needle tip  placement was confirmed after negative  aspiration, with injection of a small amount of Omnipaque 180 revealing  appropriate needle placement.  Each joint was injected with 0.5 mL of  Kenalog 40 mg per mL plus 1 mL of preservative-free 1% lidocaine.  The  patient tolerated the procedure well.  There were no complications.  The  patient was monitored and released in stable condition.   The patient's post-injection pain score is 8/10.   Patient was educated in the above findings and recommendations and  understands.  No barriers to communication.                                               [name] Daishaun, DO    JW/MEDQ  D:  09/01/2002  T:  09/02/2002  Job:  [id number]   cc:   Donia Guiles, M.D.  [address] [place]  [place] [address]  Fax: [phone]

## 2010-10-19 NOTE — Consult Note (Signed)
[facility]  Patient:    [name] [name], [name] [name] Visit Number: [id number] MRN: [id number]          Service Type: PMG Location: [facility] Attending Physician:  Sondra Come Dictated by:   Sondra Come, D.O. Admit Date:  06/05/2001   CC:         Desma Maxim, M.D.   Consultation Report  HISTORY OF PRESENT ILLNESS:  The patient returns to the clinic today for reevaluation. He continues to complain of significant pain in his right upper back, pointing to his upper trapezius and levator scapulae. I last saw the patient on July 09, 2001, at which time I performed more trigger-point injections into the right upper back to include the upper trapezius and levator scapulae. The patient states that he did not get any relief from those injections like he had previously. He is also complaining of some pain in his right shoulder. He has had right shoulder pain on and off in the past but it has not been severe. The patient is experiencing some pain radiating down into his lateral arm to his elbow but denies any numbness or paresthesias in his hand. His pain is a 7-8/10 on a subjective scale. His function and quality of life indices remain about the same. His sleep has declined somewhat secondary to the pain in his upper back and shoulder. I review health and history form and 14-point review of systems. The patient continues to take Percocet 5 mg 1 every day or every other day as needed for pain. He continues on Vioxx 25 mg daily as well as Ambien 10 mg at bedtime which he has taken for a prolonged period. In terms of his low back, he is doing very well status post facet joint injections.  PHYSICAL EXAMINATION:  GENERAL:  A healthy male in no acute distress.  VITAL SIGNS:  Blood pressure 127/74, pulse 80, respirations 12, O2 saturation 98% on room air.  EXTREMITIES:  Examination of the upper back reveals tenderness to palpation with trigger points in the upper  trapezius and levator scapulae on the right. This is not any different than previous examination. Examination of the right shoulder reveals full, active, and passive range of motion with mildly positive Hawkins, empty can, and Neer tests. Negative OBrien test. There is tenderness to palpation over the right acromioclavicular joint. Provocative maneuvers in the shoulder reproduce the patients pain to a mild degree. Spurling maneuver is negative bilaterally but causes increased pain in the upper back and parascapular region. Manual muscle testing is 5/5 bilateral upper extremities. Sensory examination is intact to light touch bilateral upper extremities. Muscle stretch reflexes are 2+/4 bilateral biceps, triceps, brachioradialis, and pronator teres.  IMPRESSION: 1. Myofascial pain right upper back. Cannot rule out underlying cervical    origin either from a disk herniation or facet joints. 2. Right shoulder impingement/rotator cuff tendonitis. This may be    contributing to the patients upper back pain.  PLAN: 1. Will obtain MRI of the cervical spine. 2. The patient is instructed to discontinue Ambien for now, and I will    begin Elavil 25 mg 1 p.o. q.h.s. #30 without refills for restorative    sleep capacity. 3. The patient is to return to clinic after MRI. 4. Will consider subacromial steroid injection to the right shoulder    if MRI is nondiagnostic.  The patient was educated on the above findings and recommendations and understands. There were no barriers to communication. Dictated by:  Sondra Come, D.O. Attending Physician:  Sondra Come DD:  07/22/01 TD:  07/23/01 Job: [id number]

## 2010-10-19 NOTE — Consult Note (Signed)
[facility]  Patient:    [name] [name], [name] [name] Visit Number: [id number] MRN: [id number]          Service Type: PMG Location: [facility] Attending Physician:  Rolly Salter Dictated by:   Sondra Come, D.O. Proc. Date: 03/23/01 Admit Date:  02/03/2001                            Consultation Report  REASON FOR EVALUATION:  Mr. Lumadue returns to clinic today as scheduled.  He states that he was doing very well following right L4-5 and L5-S1 facet injections on February 06, 2001.  Approximately one week ago, however, he noticed his pain beginning to return, however, this is not at pre-injection levels.  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  He has been pushing himself lately and attributes his increase in pain to that.  Currently, his pain is 8/10 on a subjective scale, described as throbbing and burning.  He also has noted one episode of pain radiating down into his right lower extremity, mainly in the posterior aspect of his thigh and calf, like he did prior to the injections.  He continues to take Percocet one to two times per day but mainly once a day with some relief.  He has also taken Vioxx 25 mg as well as glucosamine and chondroitin over the past two weeks.  Denies bowel and bladder dysfunction.  I review health and history form and 14-point review of systems.  PHYSICAL EXAMINATION:  GENERAL:  Physical examination reveals a healthy male in no acute distress.  VITAL SIGNS:  Blood pressure 116/81, pulse 72, respirations 16, O2 saturation 98%.  NEUROMUSCULAR:  Examination of the spine reveals decreased lumbar lordosis with mild muscle guarding but no spasm in the paraspinal muscles.  There is tenderness to palpation, bilateral lumbar paraspinal muscles.  Range of motion is full in flexion with minimal pain.  Extension is limited secondary  to moderate pain.  There is also significant pain on right rotation plus extension.  Manual muscle testing is 5/5, bilateral lower extremities, in all muscle groups tested.  Sensory exam reveals mild decrease to light touch in a right S1 distribution in the posterior thigh and calf.  Muscle stretch reflexes are 2+/4, bilateral patellar, medial hamstrings and Achilles. Straight leg raise is negative bilaterally.  IMPRESSION: 1. Lumbar facet syndrome. 2. 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  PLAN: 1. Patient instructed to begin physical therapy.  Patient assures me that he    will begin physical therapy after his busy season is over.  He understands    that this is an integral part of his treatment plan. 2. Continue current medications. 3. Will repeat lumbar facet injections.  Will consider future medial branch    blocks leading towards radiofrequency ablations if indicated. 4. Smoking cessation. 5. Patient to return to clinic for repeat lumbar facet injections.  Patient was educated in the above findings and recommendations and recommendations.  There were no barriers to communication. Dictated by:   Sondra Come, D.O. Attending Physician:  Rolly Salter DD:  03/23/01 TD:  03/24/01 Job: [id number]

## 2010-10-19 NOTE — Assessment & Plan Note (Signed)
[name] [name]                              CARDIOLOGY OFFICE NOTE   NAME:[name], [name] TAMURA                      MRN:          [id number]  DATE:02/06/2006                            DOB:          February 29, 1956    [name] is seen today in followup.  He is 55 years old.  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  I suspect  his lower back problem stems from his occupation.  He has surgical date with  Dr. Sharolyn Douglas on September 20.   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  He subsequently had recurrent symptoms which required stenting  of his intermediate branch.  I believe this was done in 2005.  His anginal  symptoms actually revolve around some pressure but also exertional dyspnea.  He has not had a lot of this.  He has had some shortness of breath that he  relates to the heat.  His review of systems remarkable for no palpitations,  PND, orthopnea.  He had a stress Myoview study performed on January 29, 2006.   Because of his back problems, he was given adenosine. His images revealed  possibility of inferolateral wall ischemia.   I had a long discussion with [name] [name] today.  Because he has had previous  stents in 1999 as well as L5 and is having significant back surgery, I  recommended a heart cardiac catheterization prior to proceeding.  The  patient, I believe, has bare-metal stents.  He is not on Plavix.  I think  defining the anatomy of the stents would also be reasonable in regards to  restarting his Plavix or not.  I explained to [name] [name] that as long as his  stents are patent, he could have his back surgery and we can entertain the  idea of resuming Plavix after the back surgery.   PAST MEDICAL HISTORY:  Otherwise primarily remarkable for his back problems.   MEDICATIONS:  1. Ambien 10 mg at night.  2. Prozac 40 mg a day.  3. Nexium 40 mg a day.  4. Aspirin one a day.  5. Nicorette gum which he chews quite a bit but he has stopped smoking.  6. Vytorin 10/40.   ALLERGIES:  He is allergic to STRAWBERRIES, ORANGE and CODEINE.   PHYSICAL EXAMINATION:  GENERAL:  He looks well.  VITAL SIGNS:  Blood pressure 130/86, pulse 71, regular rate.  LUNGS:  Clear.  HEART:  S1 and S2 with normal heart sounds.  ABDOMEN:  Benign.  EXTREMITIES:  __________.  No edema.   LABORATORY DATA:  His baseline EKG shows sinus rhythm with peri-infarct  block in QRS complex and leads II, III and aVF.   IMPRESSION:  Stable previous stent to the circumflex in 1999 and stent to  the intermediate branch in L5.  Abnormal Myoview.  JV heart catheter to be  planned to clear for surgery.  I will try to make sure that whoever does his  heart cath sends a note to Dr. Sharolyn Douglas.  I explained to the patient if he  needs repeat intervention, his back surgery will have to be put off for at  least three months.  I suspect that he will be back on Plavix one way or  another until after his back surgery or sooner.                               [name] Pick. Eden Emms, MD, [facility]    PCN/MedQ  DD:  02/06/2006  DT:  02/06/2006  Job #:  [id number]   cc:   Sharolyn Douglas, M.D.  Donia Guiles, M.D.

## 2010-10-19 NOTE — Assessment & Plan Note (Signed)
MEDICAL RECORD NUMBER:  [id number]   DATE:  Oct 02, 2004   Mr.  [name] [name] returns regarding low back pain which is due to lumbar facet and  degenerative disk disease.  The patient has had numerous injections to the  facets as well as radiofrequency neural ablation.  The patient has had some  temporary results at best.  He had a transforaminal epidural at L5 on the  right last year which provided moderate benefit.  Today the patient  represents to me with ongoing pain complaints.  He states his pain is 6 to  7/10.  Back remains tight with pain in the low back radiating into the  buttocks and occasionally into the back of the legs when it is more severe.  He is using Roxicodone sparingly as it causes him itching and tends to wind  him up.  He will take maybe 1 or 2 a day.  He continues to work for the  [employer].  He is only able to tolerate 3 days of significant work  due to back pain.  Pain does interfere with general activity and  relationships with others and enjoyment of life.  Sleep is 6 to 8 hours but  poor and broken.  Pain is generally worse when he bends and stands.  The  patient can walk about 30 minutes at a time.  Continues to drive and climb  steps.   MRI:  His 2005 lumbar spine film revealed advanced degenerative disk disease  L5-S1 with circumferential extrusion of disk material anteriorly.  Diskogenic edema was noted at L5-S1.  Also noted was facet disease, right  greater than left.  L4-L5 was also noted with significant facet disease,  right greater than left.  There was some bulging disk material noted.  No  definite L4 nerve root impingement, however.   The patient is looking for a solution for his pain problems.  He is  considering surgical opinion but does not want surgery if at all possible.   REVIEW OF SYSTEMS:  The patient reports numbness, tremor, tingling, spasm,  depression, anxiety, constipation, decreased appetite, dysuria, weakness in  the legs.   SOCIAL HISTORY:  The patient continues to work as mentioned above.  He does  not smoke or drink and is living with his wife.   PHYSICAL EXAMINATION:  VITAL SIGNS:  Blood pressure 142/101, pulse 83,  respiratory rate 16, saturating 100% on room air.  GENERAL:  The patient is in mild to moderate distress.  Affect is a bit  down.  Alert and oriented x 3.  The patient walks with a rigid gait in  reference to his low back.  Low back posture is fair to poor.  There is  flattening of lordotic curve.  He is tender to palpation throughout the  lumbar paraspinals, facets, and spinous processes, particularly at L4, L5,  and S1.  The patient's strength is generally preserved, although he had  significant pain inhibition, particularly with hip flexion and knee bending.  Greatest strength 4+ to 5/5 throughout.  Upper extremity strength is 5/5.  Reflexes were 1+ to 2+.  Sensory exam is grossly intact.  Straight leg  testing was equivocal to positive.  SI joint testing was equivocal.   ASSESSMENT:  1.  Degenerative disk disease of lumbar spine.  2.  Facet syndrome of lumbar spine.   PLAN:  1.  Will begin a trial of Avinza 30 mg daily, #30 dispensed today.  2.  Will begin Lidoderm patches  5%, 2 to the back daily.  3.  Will switch out oxycodone for hydrocodone for breakthrough pain.      Hopefully this will cause less side effects.  He tolerated this in the      past.  4.  Will send the patient to IDD therapy to work on spinal positioning and      computerized manipulation.  Have had good results in similar patients      with this treatment.  The patient was given reading material regarding      this and seemed enthusiastic.  5.  Will consider surgical opinion at some point.  6.  Will see the patient back in one month's time.       ZTS/MedQ  D:  10/02/2004 13:07:53  T:  10/02/2004 15:42:50  Job #:  [id number]

## 2010-12-10 ENCOUNTER — Other Ambulatory Visit: Payer: Self-pay | Admitting: Gastroenterology

## 2010-12-12 ENCOUNTER — Ambulatory Visit
Admission: RE | Admit: 2010-12-12 | Discharge: 2010-12-12 | Disposition: A | Discharge status: Home / Self Care | Payer: BC Managed Care – PPO | Source: Ambulatory Visit | Attending: Gastroenterology | Admitting: Gastroenterology

## 2010-12-12 MED ORDER — IOHEXOL 300 MG/ML  SOLN
100.0000 mL | Freq: Once | INTRAMUSCULAR | Status: AC | PRN
  Administered 2010-12-12: 100 mL via INTRAVENOUS

## 2010-12-13 ENCOUNTER — Telehealth: Payer: Self-pay | Admitting: Cardiovascular Disease

## 2010-12-13 DIAGNOSIS — I2510 Atherosclerotic heart disease of native coronary artery without angina pectoris: Secondary | ICD-10-CM

## 2010-12-13 NOTE — Telephone Encounter (Signed)
Spoke with [name] [name], he has been having a lot of GI issues and had a CT scan that showed extensive coronary calcification. He has been having a lot of discomfort but is unable to determine if it is stomach or heart. He does have occ SOB. It has been several years since his last stress testing. Per dr Eden Emms [name] [name] will have lexiscan myoview and follow up with dr Eden Emms sameday [name] [name]

## 2010-12-13 NOTE — Telephone Encounter (Signed)
Discuss test result.

## 2010-12-18 ENCOUNTER — Other Ambulatory Visit: Payer: Self-pay | Admitting: Cardiovascular Disease

## 2010-12-27 ENCOUNTER — Encounter: Payer: Self-pay | Admitting: Cardiovascular Disease

## 2010-12-31 ENCOUNTER — Ambulatory Visit (HOSPITAL_COMMUNITY): Payer: BC Managed Care – PPO | Attending: Cardiovascular Disease | Admitting: Radiology

## 2010-12-31 ENCOUNTER — Ambulatory Visit (INDEPENDENT_AMBULATORY_CARE_PROVIDER_SITE_OTHER): Payer: BC Managed Care – PPO | Admitting: Cardiovascular Disease

## 2010-12-31 ENCOUNTER — Encounter: Payer: Self-pay | Admitting: Cardiovascular Disease

## 2010-12-31 VITALS — Ht 72.0 in | Wt 186.0 lb

## 2010-12-31 DIAGNOSIS — I2510 Atherosclerotic heart disease of native coronary artery without angina pectoris: Secondary | ICD-10-CM

## 2010-12-31 DIAGNOSIS — I10 Essential (primary) hypertension: Secondary | ICD-10-CM

## 2010-12-31 DIAGNOSIS — R0609 Other forms of dyspnea: Secondary | ICD-10-CM

## 2010-12-31 DIAGNOSIS — R9430 Abnormal result of cardiovascular function study, unspecified: Secondary | ICD-10-CM

## 2010-12-31 DIAGNOSIS — C9000 Multiple myeloma not having achieved remission: Secondary | ICD-10-CM

## 2010-12-31 DIAGNOSIS — R079 Chest pain, unspecified: Secondary | ICD-10-CM

## 2010-12-31 DIAGNOSIS — G4733 Obstructive sleep apnea (adult) (pediatric): Secondary | ICD-10-CM

## 2010-12-31 MED ORDER — TECHNETIUM TC 99M TETROFOSMIN IV KIT
11.0000 | PACK | Freq: Once | INTRAVENOUS | Status: AC | PRN
  Administered 2010-12-31: 11 via INTRAVENOUS

## 2010-12-31 MED ORDER — REGADENOSON 0.4 MG/5ML IV SOLN
0.4000 mg | Freq: Once | INTRAVENOUS | Status: AC
  Administered 2010-12-31: 0.4 mg via INTRAVENOUS

## 2010-12-31 MED ORDER — TECHNETIUM TC 99M TETROFOSMIN IV KIT
33.0000 | PACK | Freq: Once | INTRAVENOUS | Status: AC | PRN
  Administered 2010-12-31: 33 via INTRAVENOUS

## 2010-12-31 NOTE — Assessment & Plan Note (Signed)
STable.  Myouve normal today with no ischemia.  Continue ASA and medical Rx

## 2010-12-31 NOTE — Assessment & Plan Note (Signed)
Well controlled.  Continue current medications and low sodium Dash type diet.    

## 2010-12-31 NOTE — Patient Instructions (Addendum)
Your physician recommends that you schedule a follow-up appointment in:  6 MONTHS  WITH DR Eden Emms ALSO NEEDS F/U WITH DR SOOD FOR COPD 3-4 WEEKS  Your physician recommends that you continue on your current medications as directed. Please refer to the Current Medication list given to you today.

## 2010-12-31 NOTE — Assessment & Plan Note (Signed)
No active wheezing but dyspnea ? Worse with chemoRx  Will arrange F/U with Dr Craige Cotta in the next month

## 2010-12-31 NOTE — Assessment & Plan Note (Signed)
F/U [place].  Resume revlamid when gi issues resolved.  SPEP/UPEP per oncology

## 2010-12-31 NOTE — Progress Notes (Signed)
[facility] SITE 3 NUCLEAR MED [address] [place] [address] [phone]  Cardiology Nuclear Med Study  [name] [name] is a 56 y.o. male [phone] [birth year]   Nuclear Med Background Indication for Stress Test:  Evaluation for Ischemia, Stent Patency and 12/12/10 CT of Abdomen/Pelvis: Extensive coronary artery calcification History:  '05 Non Q MI>Stents-Om/IM; '07 Cath:Patient Stents w/ N/O CAD, EF=60%; '11 Echo:EF=55-60%; 12/12/10 Abdomen/Pelvic OZ:[id number] Coronary Artery Calcifications; History of Chemo Cardiac Risk Factors: Family History - CAD, History of Smoking, Hypertension, Lipids and NIDDM  Symptoms:  Chest Pain (last episode of chest discomfort was yesterday), DOE, Fatigue and Rapid HR   Nuclear Pre-Procedure Caffeine/Decaff Intake:  None NPO After: 9:30pm   Lungs:  Diminished, but no wheezes.  O2 sat 97% on RA. IV 0.9% NS with Angio Cath:  20g  IV Site: R Antecubital  IV Started by:  Irean Hong, RN  Chest Size (in):  42 Cup Size: n/a  Height: 6' (1.829 m)  Weight:  186 lb (84.369 kg)  BMI:  Body mass index is 25.23 kg/(m^2). Tech Comments:  n/a    Nuclear Med Study 1 or 2 day study: 1 day  Stress Test Type:  Lexiscan  Reading MD: Willa Rough, MD  Order Authorizing Provider:  Charlton Haws, MD  Resting Radionuclide: Technetium 26m Tetrofosmin  Resting Radionuclide Dose: 11.0 mCi   Stress Radionuclide:  Technetium 8m Tetrofosmin  Stress Radionuclide Dose: 33.0 mCi           Stress Protocol Rest HR: 62 Stress HR: 98  Rest BP: 126/90 Stress BP: 127/92  Exercise Time (min): n/a METS: n/a   Predicted Max HR: 165 bpm % Max HR: 59.39 bpm Rate Pressure Product: 96295   Dose of Adenosine (mg):  n/a Dose of Lexiscan: 0.4 mg  Dose of Atropine (mg): n/a Dose of Dobutamine: n/a mcg/kg/min (at max HR)  Stress Test Technologist: Smiley Houseman, CMA-N  Nuclear Technologist:  Doyne Keel, CNMT     Rest Procedure:  Myocardial perfusion imaging  was performed at rest 45 minutes following the intravenous administration of Technetium 23m Tetrofosmin.  Rest ECG: NSR  Stress Procedure:  The patient received IV Lexiscan 0.4 mg over 15-seconds.  Technetium 73m Tetrofosmin injected at 30-seconds.  There were no significant changes with Lexiscan.  He did c/o chest tightness with infusion.  Quantitative spect images were obtained after a 45 minute delay.  Stress ECG: No significant change from baseline ECG  QPS Raw Data Images:  Patient motion noted; appropriate software correction applied. Stress Images:  No diagnostic abnormality Rest Images:  Same as stress Subtraction (SDS):  No evidence of ischemia. Transient Ischemic Dilatation (Normal <1.22):  1.08 Lung/Heart Ratio (Normal <0.45):  0.26  Quantitative Gated Spect Images QGS EDV:  93 ml QGS ESV:  43 ml QGS cine images:  Normal Wall Motion QGS EF: 54%  Impression Exercise Capacity:  Lexiscan with no exercise. BP Response:  Normal blood pressure response. Clinical Symptoms:  Chest tight ECG Impression:  No significant ST segment change suggestive of ischemia. Comparison with Prior Nuclear Study: No images to compare  Overall Impression:  Normal stress nuclear study.  Willa Rough

## 2010-12-31 NOTE — Progress Notes (Signed)
[name] [name] is seen today for F/U of CAD, HTN elevated lipids and dyspnea. He has a history of stenting of the circ and IM. He had a non-ischemic myovue in 2008. 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 He has not had a CXR this year He sees Dr Craige Cotta for apnea. Recent diagnosis of myeloma and getting [place] 6 [place]. No SSCP mostly fatigue and less SOB.  Had CT scan for gi symptoms and ? Lots of coronary calcification.  I suspect they were seeing his stents at least partly.  Has had pressure in chest and indigestion but I think this is more gi in nature  Reviewed myovue from today   Lexiscan Normal with no ischemia EF 54 %  Needs F/U with Dr Craige Cotta for COPD that has been a little worse in the summer.  Sees [facility] for myeloma.  Has F/U with [facility] for gi problems  ROS: Denies fever, malais, weight loss, blurry vision, decreased visual acuity, cough, sputum, SOB, hemoptysis, pleuritic pain, palpitaitons, heartburn, abdominal pain, melena, lower extremity edema, claudication, or rash.  All other systems reviewed and negative  General: Affect appropriate Healthy:  appears stated age HEENT: normal Neck supple with no adenopathy JVP normal no bruits no thyromegaly Lungs clear with no wheezing and good diaphragmatic motion Heart:  S1/S2 no murmur,rub, gallop or click PMI normal Abdomen: tens  BS positve, no tenderness, no AAA no bruit.  No HSM or HJR Distal pulses intact with no bruits No edema Neuro non-focal Skin warm and dry No muscular weakness   Current Outpatient Prescriptions  Medication Sig Dispense Refill  . ALPRAZolam (XANAX) 0.5 MG tablet Take 0.5 mg by mouth daily as needed.        . clopidogrel (PLAVIX) 75 MG tablet TAKE 1 TABLET DAILY  90 tablet  2  . esomeprazole (NEXIUM) 40 MG capsule Take 40 mg by mouth 2 (two) times daily.       Marland Kitchen ezetimibe-simvastatin (VYTORIN) 10-40 MG per tablet Take 1 tablet by mouth at  bedtime.        . gabapentin (NEURONTIN) 300 MG capsule Take 600 mg by mouth 2 (two) times daily.        Marland Kitchen lisinopril (PRINIVIL,ZESTRIL) 10 MG tablet Take 10 mg by mouth daily.        . metFORMIN (GLUCOPHAGE) 500 MG tablet Take 1,000 mg by mouth 2 (two) times daily.       . nitroGLYCERIN (NITROLINGUAL) 0.4 MG/SPRAY spray Place 1 spray under the tongue every 5 (five) minutes as needed.        . Omega-3 Fatty Acids (FISH OIL) 1000 MG CAPS Take 1 capsule by mouth daily.        Marland Kitchen oxyCODONE-acetaminophen (PERCOCET) 5-325 MG per tablet Take 1 tablet by mouth every 4 (four) hours as needed.        . Probiotic Product (PROBIOTIC FORMULA PO) Take by mouth daily.        . prochlorperazine (COMPAZINE) 10 MG tablet Take 10 mg by mouth every 6 (six) hours as needed. Unsure of dosage       . Tamsulosin HCl (FLOMAX) 0.4 MG CAPS Take 0.4 mg by mouth daily.        . triazolam (HALCION) 0.25 MG tablet Take 0.5 mg by mouth at bedtime.         No current facility-administered medications for this visit.   Facility-Administered Medications Ordered  in Other Visits  Medication Dose Route Frequency Provider Last Rate Last Dose  . regadenoson (LEXISCAN) injection SOLN 0.4 mg  0.4 mg Intravenous Once Wendall Stade, MD   0.4 mg at 12/31/10 1104  . technetium tetrofosmin (TC-MYOVIEW) injection 11 milli Curie  11 milli Curie Intravenous Once PRN Luis Abed, MD   11 milli Curie at 12/31/10 [phone]  . technetium tetrofosmin (TC-MYOVIEW) injection 33 milli Curie  33 milli Curie Intravenous Once PRN Luis Abed, MD   33 milli Curie at 12/31/10 1105    Allergies  Codeine  Electrocardiogram:  Assessment and Plan

## 2011-01-01 NOTE — Progress Notes (Signed)
Nuclear report routed to Dr. Eden Emms. Alexyia Guarino, Farris Has

## 2011-01-23 ENCOUNTER — Ambulatory Visit: Payer: BC Managed Care – PPO | Admitting: Pulmonary Disease

## 2011-09-17 ENCOUNTER — Other Ambulatory Visit: Payer: Self-pay | Admitting: Cardiovascular Disease

## 2011-12-04 ENCOUNTER — Ambulatory Visit (INDEPENDENT_AMBULATORY_CARE_PROVIDER_SITE_OTHER): Payer: 59 | Admitting: Cardiovascular Disease

## 2011-12-04 ENCOUNTER — Encounter: Payer: Self-pay | Admitting: Cardiovascular Disease

## 2011-12-04 VITALS — BP 118/80 | HR 72 | Ht 72.0 in | Wt 177.0 lb

## 2011-12-04 DIAGNOSIS — C9000 Multiple myeloma not having achieved remission: Secondary | ICD-10-CM

## 2011-12-04 DIAGNOSIS — I10 Essential (primary) hypertension: Secondary | ICD-10-CM

## 2011-12-04 DIAGNOSIS — I2510 Atherosclerotic heart disease of native coronary artery without angina pectoris: Secondary | ICD-10-CM

## 2011-12-04 DIAGNOSIS — G4733 Obstructive sleep apnea (adult) (pediatric): Secondary | ICD-10-CM

## 2011-12-04 NOTE — Patient Instructions (Signed)
Your physician wants you to follow-up in: YEAR WITH DR NISHAN  You will receive a reminder letter in the mail two months in advance. If you don't receive a letter, please call our office to schedule the follow-up appointment.  Your physician recommends that you continue on your current medications as directed. Please refer to the Current Medication list given to you today. 

## 2011-12-04 NOTE — Assessment & Plan Note (Signed)
Urine sample given at [facility] office this week.  ? Remove port a cath in a year if not needed

## 2011-12-04 NOTE — Progress Notes (Signed)
Patient ID: [name], male   DOB: 06-19-1955, 56 y.o.   MRN: [id number] [name] [name] is seen today for F/U of CAD, HTN elevated lipids and dyspnea. He has a history of stenting of the circ and IM. He had a non-ischemic myovue in 2008. 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 He has not had a CXR this year He sees Dr [name] [name] for apnea. Diagnosis of myeloma and finished a 6 [place] course of [place]. No SSCP mostly fatigue and less SOB. Had CT scan for gi symptoms and ? Lots of coronary calcification. I suspect they were seeing his stents at least partly.   Reviewed myovue from 12/31/10 Lexiscan Normal with no ischemia EF 54 %  Has some diabetic neuropathy  Primary [name] On[facility] [name] [name] [name] [name]  Needs F/U with Dr [name] [name] for COPD that has been a little worse in the summer. Sees [name] [name] for myeloma. Has F/U with [name] [name] for gi problems  ROS: Denies fever, malais, weight loss, blurry vision, decreased visual acuity, cough, sputum, SOB, hemoptysis, pleuritic pain, palpitaitons, heartburn, abdominal pain, melena, lower extremity edema, claudication, or rash.  All other systems reviewed and negative  General: Affect appropriate Healthy:  appears stated age HEENT: normal Neck supple with no adenopathy JVP normal no bruits no thyromegaly Lungs clear with no wheezing and good diaphragmatic motion Heart:  S1/S2 no murmur, no rub, gallop or click PMI normal Abdomen: benighn, BS positve, no tenderness, no AAA no bruit.  No HSM or HJR Distal pulses intact with no bruits No edema Neuro non-focal Skin warm and dry No muscular weakness Port a Cath left subclavian   Current Outpatient Prescriptions  Medication Sig Dispense Refill  . ALPRAZolam (XANAX) 0.5 MG tablet Take 0.5 mg by mouth daily as needed.        . clopidogrel (PLAVIX) 75 MG tablet TAKE 1 TABLET DAILY  90 tablet  1  . esomeprazole (NEXIUM) 40 MG capsule Take 40 mg by mouth 2  (two) times daily.       Marland Kitchen ezetimibe-simvastatin (VYTORIN) 10-40 MG per tablet Take 1 tablet by mouth at bedtime.        . gabapentin (NEURONTIN) 300 MG capsule 4 TAB AM AND 4 TABS PM      . lisinopril (PRINIVIL,ZESTRIL) 20 MG tablet Take 20 mg by mouth daily.      . metFORMIN (GLUCOPHAGE) 500 MG tablet Take 1,000 mg by mouth 2 (two) times daily.       . nitroGLYCERIN (NITROLINGUAL) 0.4 MG/SPRAY spray Place 1 spray under the tongue every 5 (five) minutes as needed.        . Omega-3 Fatty Acids (FISH OIL) 1000 MG CAPS Take 1 capsule by mouth daily.        Marland Kitchen oxyCODONE-acetaminophen (PERCOCET) 5-325 MG per tablet Take 1 tablet by mouth every 4 (four) hours as needed.        . Tamsulosin HCl (FLOMAX) 0.4 MG CAPS Take 0.4 mg by mouth daily.        . TRADJENTA 5 MG TABS tablet Take 1 tablet by mouth Daily.      . triazolam (HALCION) 0.25 MG tablet Take 0.5 mg by mouth at bedtime.        Marland Kitchen VIIBRYD 40 MG TABS Take 1 tablet by mouth Daily.        Allergies  Codeine  Electrocardiogram:  NSR rate 72 normal ECG  Assessment and Plan

## 2011-12-04 NOTE — Assessment & Plan Note (Signed)
Stable F/U pulmonary

## 2011-12-04 NOTE — Assessment & Plan Note (Signed)
Well controlled.  Continue current medications and low sodium Dash type diet.    

## 2011-12-04 NOTE — Assessment & Plan Note (Signed)
Stable with no angina and good activity level.  Continue medical Rx  

## 2012-02-21 ENCOUNTER — Other Ambulatory Visit: Payer: Self-pay | Admitting: Cardiovascular Disease

## 2013-02-03 ENCOUNTER — Encounter: Payer: Self-pay | Admitting: Cardiovascular Disease

## 2013-02-04 ENCOUNTER — Other Ambulatory Visit: Payer: Self-pay | Admitting: *Deleted

## 2013-02-04 ENCOUNTER — Other Ambulatory Visit: Payer: Self-pay

## 2013-02-04 MED ORDER — CLOPIDOGREL BISULFATE 75 MG PO TABS: ORAL_TABLET | ORAL | Status: DC

## 2013-02-08 ENCOUNTER — Telehealth: Payer: Self-pay | Admitting: Cardiovascular Disease

## 2013-02-08 NOTE — Telephone Encounter (Signed)
Follow Up  Pt returning call/// not sure what it was about... Believes it was about refill on plavix.

## 2013-02-08 NOTE — Telephone Encounter (Signed)
CALLED PT  EARLIER TO GIVE LAB RESULTS   PT AWARE TO   F/U  WITH PMD  RE  ELEVATED  A1C .[name] [name]

## 2013-02-12 ENCOUNTER — Encounter: Payer: Self-pay | Admitting: Cardiovascular Disease

## 2013-02-12 ENCOUNTER — Ambulatory Visit (INDEPENDENT_AMBULATORY_CARE_PROVIDER_SITE_OTHER): Payer: MEDICARE | Admitting: Cardiovascular Disease

## 2013-02-12 VITALS — BP 125/76 | HR 70 | Wt 178.0 lb

## 2013-02-12 DIAGNOSIS — I2510 Atherosclerotic heart disease of native coronary artery without angina pectoris: Secondary | ICD-10-CM

## 2013-02-12 DIAGNOSIS — E119 Type 2 diabetes mellitus without complications: Secondary | ICD-10-CM | POA: Insufficient documentation

## 2013-02-12 MED ORDER — CLOPIDOGREL BISULFATE 75 MG PO TABS: ORAL_TABLET | ORAL | Status: DC

## 2013-02-12 MED ORDER — NITROGLYCERIN 0.4 MG SL SUBL: 0.4000 mg | SUBLINGUAL_TABLET | SUBLINGUAL | Status: DC | PRN

## 2013-02-12 NOTE — Assessment & Plan Note (Signed)
Stable blood counts  F/u heme onc

## 2013-02-12 NOTE — Assessment & Plan Note (Signed)
Discussed exercise and low carb diet.  Importance of good BS control for CAD.  F/U primary Hopefully with better compliance and tradjenta he will be better

## 2013-02-12 NOTE — Progress Notes (Signed)
Patient ID: [name] [name], male   DOB: Mar 05, 1956, 57 y.o.   MRN: [id number] Joah is seen today for F/U of CAD, HTN elevated lipids and dyspnea. He has a history of stenting of the circ and IM. He had a non-ischemic myovue in 2008. 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 He has not had a CXR this year He sees Dr Craige Cotta for apnea. Diagnosis of myeloma and finished a 6 [place] course [place]. No SSCP mostly fatigue and less SOB. Had CT scan for gi symptoms and ? Lots of coronary calcification. I suspect they were seeing his stents at least partly.  Reviewed myovue from 12/31/10 Lexiscan Normal with no ischemia EF 54 % Has some diabetic neuropathy  Primary Farrel Demark  Oncology [facility]  Needs F/U with Dr Craige Cotta for COPD that has been a little worse in the summer. Sees [facility] for myeloma. Has F/U with [facility] for gi problems  BS is high with A1c over 7  He is more sedentary and does not watch his carbs the way he should  Just prescribed tradjenta but has not started  ROS: Denies fever, malais, weight loss, blurry vision, decreased visual acuity, cough, sputum, SOB, hemoptysis, pleuritic pain, palpitaitons, heartburn, abdominal pain, melena, lower extremity edema, claudication, or rash.  All other systems reviewed and negative  General: Affect appropriate Healthy:  appears stated age HEENT: normal Neck supple with no adenopathy JVP normal no bruits no thyromegaly Lungs clear with no wheezing and good diaphragmatic motion Heart:  S1/S2 no murmur, no rub, gallop or click PMI normal Abdomen: benighn, BS positve, no tenderness, no AAA no bruit.  No HSM or HJR Distal pulses intact with no bruits No edema Neuro non-focal Skin warm and dry No muscular weakness   Current Outpatient Prescriptions  Medication Sig Dispense Refill  . ALPRAZolam (XANAX) 0.5 MG tablet Take 0.5 mg by mouth daily as needed.        . ARIPiprazole  (ABILIFY) 2 MG tablet Take 2 mg by mouth daily.      Marland Kitchen aspirin 81 MG tablet Take 81 mg by mouth daily.      . clopidogrel (PLAVIX) 75 MG tablet TAKE 1 TABLET DAILY  90 tablet  0  . esomeprazole (NEXIUM) 40 MG capsule Take 40 mg by mouth 2 (two) times daily.       Marland Kitchen ezetimibe-simvastatin (VYTORIN) 10-40 MG per tablet Take 1 tablet by mouth at bedtime.        . gabapentin (NEURONTIN) 300 MG capsule 3 TAB AM AND 4 TABS PM      . lisinopril (PRINIVIL,ZESTRIL) 10 MG tablet Take 5 mg by mouth daily.      . metFORMIN (GLUCOPHAGE) 500 MG tablet Take 1,000 mg by mouth 2 (two) times daily.       . nitroGLYCERIN (NITROLINGUAL) 0.4 MG/SPRAY spray Place 1 spray under the tongue every 5 (five) minutes as needed.        Marland Kitchen oxyCODONE-acetaminophen (PERCOCET) 5-325 MG per tablet Take 1 tablet by mouth every 4 (four) hours as needed.        . sitaGLIPtin (JANUVIA) 100 MG tablet Take 100 mg by mouth daily.      . Tamsulosin HCl (FLOMAX) 0.4 MG CAPS Take 0.4 mg by mouth daily.        . triazolam (HALCION) 0.25 MG tablet Take 0.5 mg by mouth at bedtime.        Marland Kitchen  TRADJENTA 5 MG TABS tablet Take 1 tablet by mouth Daily.       No current facility-administered medications for this visit.    Allergies  Codeine  Electrocardiogram:  SR rate 70  Normal   Assessment and Plan

## 2013-02-12 NOTE — Patient Instructions (Addendum)
Your physician wants you to follow-up in:  6 MONTHS WITH DR NISHAN  You will receive a reminder letter in the mail two months in advance. If you don't receive a letter, please call our office to schedule the follow-up appointment. Your physician recommends that you continue on your current medications as directed. Please refer to the Current Medication list given to you today. 

## 2013-02-12 NOTE — Assessment & Plan Note (Signed)
Well controlled.  Continue current medications and low sodium Dash type diet.    

## 2013-02-12 NOTE — Assessment & Plan Note (Signed)
Stable with no angina and good activity level.  Continue medical Rx New nitro called into [facility] 

## 2013-02-26 ENCOUNTER — Telehealth: Payer: Self-pay | Admitting: *Deleted

## 2013-02-26 ENCOUNTER — Other Ambulatory Visit: Payer: Self-pay | Admitting: *Deleted

## 2013-02-26 MED ORDER — CLOPIDOGREL BISULFATE 75 MG PO TABS: ORAL_TABLET | ORAL | Status: DC

## 2013-02-26 NOTE — Telephone Encounter (Signed)
LEFT  MESSAGE  FOR  DR SHAW'S NURSE  RE   CHANGING PT'S   NEXIUM TO ANOTHER  MED   DUE  TO PT   TAKING PLAVIX    DR SHAW  NUMBER  IS   [phone]./CY

## 2013-02-26 NOTE — Telephone Encounter (Signed)
WILL CHECK WITH  DR Clelia Croft  ON Monday  AND WILL CALL BACK  NEXT WEEK./CY

## 2013-03-01 MED ORDER — PANTOPRAZOLE SODIUM 40 MG PO TBEC: 40.0000 mg | DELAYED_RELEASE_TABLET | Freq: Every day | ORAL | Status: AC

## 2013-03-01 NOTE — Telephone Encounter (Signed)
DR Clelia Croft CHANGED  TO   PROTONIX SEE MED LIST

## 2013-06-07 ENCOUNTER — Other Ambulatory Visit: Payer: Self-pay

## 2013-06-07 MED ORDER — EZETIMIBE-SIMVASTATIN 10-40 MG PO TABS: 1.0000 | ORAL_TABLET | Freq: Every day | ORAL | Status: DC

## 2013-07-05 ENCOUNTER — Other Ambulatory Visit: Payer: Self-pay | Admitting: *Deleted

## 2013-07-05 ENCOUNTER — Telehealth: Payer: Self-pay | Admitting: *Deleted

## 2013-07-05 DIAGNOSIS — Z79899 Other long term (current) drug therapy: Secondary | ICD-10-CM

## 2013-07-05 DIAGNOSIS — I2510 Atherosclerotic heart disease of native coronary artery without angina pectoris: Secondary | ICD-10-CM

## 2013-07-05 NOTE — Telephone Encounter (Signed)
LEFT MESSAGE RE  NEEDING TO HAVE  STRESS MYOVIEW  THIS  WEEK PER  DR NISHAN./CY

## 2013-07-05 NOTE — Telephone Encounter (Signed)
[name] [name] ','<More Detail >>       Josue Hector, MD       Sent: Fri July 02, 2013 3:07 PM    To: Richmond Campbell, LPN                   Message     Received call from primary Patient having psych issues and seraqual and abilify not working Want to start vyvanase. Has stents in two arteries no myvoe in over 2.5 years Will schedule stress myovue next week if ok can start meds and monitor HR/BP response F/U with me 4 weeks after starting meds                      Progress Notes EDUARDO HONOR (MR# [id number])       Progress Notes Info     Author Note Status Last Update User Last Update Date/Time    Josue Hector, MD Signed Josue Hector, MD 02/12/2013 4:43 PM           Progress Notes     Patient ID: [name] [name], male DOB: [birth year], 58 y.o. MRN: [id number]  [name] [name] is seen today for F/U of CAD, HTN elevated lipids and dyspnea. He has a history of stenting of the circ and IM. He had a non-ischemic myovue in 2008. 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 He has not had a CXR this year He sees Dr [name] for apnea. Diagnosis of myeloma and finished a 6 [place] course [place]. No SSCP mostly fatigue and less SOB. Had CT scan for gi symptoms and ? Lots of coronary calcification. I suspect they were seeing his stents at least partly.  Reviewed myovue from 12/31/10 Lexiscan Normal with no ischemia EF 54 % Has some diabetic neuropathy  Primary [place]  Needs F/U with Dr [name] for COPD that has been a little worse in the summer. Sees [facility] for myeloma. Has F/U with [facility] for gi problems  BS is high with A1c over 7 He is more sedentary and does not watch his carbs the way he should Just prescribed tradjenta but has not started  ROS: Denies fever, malais, weight loss, blurry vision, decreased visual acuity, cough, sputum, SOB, hemoptysis, pleuritic  pain, palpitaitons, heartburn, abdominal pain, melena, lower extremity edema, claudication, or rash. All other systems reviewed and negative  General:  Affect appropriate  Healthy: appears stated age  42: normal  Neck supple with no adenopathy  JVP normal no bruits no thyromegaly  Lungs clear with no wheezing and good diaphragmatic motion  Heart: S1/S2 no murmur, no rub, gallop or click  PMI normal  Abdomen: benighn, BS positve, no tenderness, no AAA  no bruit. No HSM or HJR  Distal pulses intact with no bruits  No edema  Neuro non-focal  Skin warm and dry  No muscular weakness     Current Outpatient Prescriptions     Medication  Sig  Dispense  Refill     .  ALPRAZolam (XANAX) 0.5 MG tablet  Take 0.5 mg by mouth daily as needed.       .  ARIPiprazole (ABILIFY) 2 MG tablet  Take 2 mg by mouth daily.       Marland Kitchen  aspirin 81 MG tablet  Take 81 mg by mouth daily.       Marland Kitchen  clopidogrel (PLAVIX) 75 MG tablet  TAKE 1 TABLET DAILY  90 tablet  0     .  esomeprazole (NEXIUM) 40 MG capsule  Take 40 mg by mouth 2 (two) times daily.       Marland Kitchen  ezetimibe-simvastatin (VYTORIN) 10-40 MG per tablet  Take 1 tablet by mouth at bedtime.       .  gabapentin (NEURONTIN) 300 MG capsule  3 TAB AM AND 4 TABS PM       .  lisinopril (PRINIVIL,ZESTRIL) 10 MG tablet  Take 5 mg by mouth daily.       .  metFORMIN (GLUCOPHAGE) 500 MG tablet  Take 1,000 mg by mouth 2 (two) times daily.       .  nitroGLYCERIN (NITROLINGUAL) 0.4 MG/SPRAY spray  Place 1 spray under the tongue every 5 (five) minutes as needed.       Marland Kitchen  oxyCODONE-acetaminophen (PERCOCET) 5-325 MG per tablet  Take 1 tablet by mouth every 4 (four) hours as needed.       .  sitaGLIPtin (JANUVIA) 100 MG tablet  Take 100 mg by mouth daily.       .  Tamsulosin HCl (FLOMAX) 0.4 MG CAPS  Take 0.4 mg by mouth daily.       .  triazolam (HALCION) 0.25 MG tablet  Take 0.5 mg by mouth at bedtime.       .  TRADJENTA 5 MG TABS tablet  Take 1 tablet by mouth Daily.        No current facility-administered medications for this visit.      Allergies  Codeine  Electrocardiogram: SR rate 70 Normal  Assessment and Plan            Pt  Aware  ./CY

## 2013-07-06 NOTE — Telephone Encounter (Signed)
PT  TO HAVE STRESS MYOVIEW  ON  07-15-13  AT 12:00  WILL DETERMINE  AFTERWARDS  IF  CAN  CHANGE  MEDS   AND IF DOES  NEEDS  F/U  WITH  DR Johnsie Cancel IN 4 WEEKS ./CY

## 2013-07-07 ENCOUNTER — Encounter (HOSPITAL_COMMUNITY): Payer: MEDICARE

## 2013-07-15 ENCOUNTER — Telehealth: Payer: Self-pay | Admitting: *Deleted

## 2013-07-15 ENCOUNTER — Ambulatory Visit (HOSPITAL_COMMUNITY): Payer: Medicare Other | Attending: Cardiovascular Disease | Admitting: Radiology

## 2013-07-15 VITALS — BP 126/89 | HR 68 | Ht 72.0 in | Wt 173.0 lb

## 2013-07-15 DIAGNOSIS — E785 Hyperlipidemia, unspecified: Secondary | ICD-10-CM | POA: Insufficient documentation

## 2013-07-15 DIAGNOSIS — I252 Old myocardial infarction: Secondary | ICD-10-CM | POA: Insufficient documentation

## 2013-07-15 DIAGNOSIS — Z8249 Family history of ischemic heart disease and other diseases of the circulatory system: Secondary | ICD-10-CM | POA: Insufficient documentation

## 2013-07-15 DIAGNOSIS — Z79899 Other long term (current) drug therapy: Secondary | ICD-10-CM

## 2013-07-15 DIAGNOSIS — J449 Chronic obstructive pulmonary disease, unspecified: Secondary | ICD-10-CM | POA: Insufficient documentation

## 2013-07-15 DIAGNOSIS — E119 Type 2 diabetes mellitus without complications: Secondary | ICD-10-CM | POA: Insufficient documentation

## 2013-07-15 DIAGNOSIS — I2510 Atherosclerotic heart disease of native coronary artery without angina pectoris: Secondary | ICD-10-CM | POA: Insufficient documentation

## 2013-07-15 DIAGNOSIS — J4489 Other specified chronic obstructive pulmonary disease: Secondary | ICD-10-CM | POA: Insufficient documentation

## 2013-07-15 DIAGNOSIS — Z9221 Personal history of antineoplastic chemotherapy: Secondary | ICD-10-CM | POA: Insufficient documentation

## 2013-07-15 DIAGNOSIS — I10 Essential (primary) hypertension: Secondary | ICD-10-CM | POA: Insufficient documentation

## 2013-07-15 DIAGNOSIS — Z87891 Personal history of nicotine dependence: Secondary | ICD-10-CM | POA: Insufficient documentation

## 2013-07-15 DIAGNOSIS — R0609 Other forms of dyspnea: Secondary | ICD-10-CM | POA: Insufficient documentation

## 2013-07-15 DIAGNOSIS — R0989 Other specified symptoms and signs involving the circulatory and respiratory systems: Principal | ICD-10-CM | POA: Insufficient documentation

## 2013-07-15 DIAGNOSIS — R0602 Shortness of breath: Secondary | ICD-10-CM

## 2013-07-15 DIAGNOSIS — Z85820 Personal history of malignant melanoma of skin: Secondary | ICD-10-CM | POA: Insufficient documentation

## 2013-07-15 MED ORDER — TECHNETIUM TC 99M SESTAMIBI GENERIC - CARDIOLITE
10.0000 | Freq: Once | INTRAVENOUS | Status: AC | PRN
  Administered 2013-07-15: 10 via INTRAVENOUS

## 2013-07-15 MED ORDER — TECHNETIUM TC 99M SESTAMIBI GENERIC - CARDIOLITE
30.0000 | Freq: Once | INTRAVENOUS | Status: AC | PRN
  Administered 2013-07-15: 30 via INTRAVENOUS

## 2013-07-15 MED ORDER — REGADENOSON 0.4 MG/5ML IV SOLN
0.4000 mg | Freq: Once | INTRAVENOUS | Status: AC
  Administered 2013-07-15: 0.4 mg via INTRAVENOUS

## 2013-07-15 NOTE — Telephone Encounter (Signed)
Mailed [employer] patient assistance form reassessment form for patients vytorin.

## 2013-07-15 NOTE — Progress Notes (Signed)
[facility] [address] [place], [place] [address] [phone]    Cardiology Nuclear Med Study  [name] [name] is a 58 y.o. male     MRN : [id number]     DOB: [birth year]  Procedure Date: 07/15/2013  Nuclear Med Background Indication for Stress Test:  Evaluation for Ischemia and Clearance to start new prescription for depression History:  CAD, MI, Cath, Stents, Echo 2011 EF 55-60%, MPI 2012 (normal) EF 54%, COPD, Myeloma (chemo) Cardiac Risk Factors: Family History - CAD, History of Smoking, Hypertension, Lipids and NIDDM  Symptoms:  DOE   Nuclear Pre-Procedure Caffeine/Decaff Intake:  10:00pm NPO After: 10:00pm   Lungs:  clear O2 Sat: 98% on room air. IV 0.9% NS with Angio Cath:  20g  IV Site: R Hand  IV Started by:  Matilde Haymaker, RN  Chest Size (in):  44 Cup Size: n/a  Height: 6' (1.829 m)  Weight:  173 lb (78.472 kg)  BMI:  Body mass index is 23.46 kg/(m^2). Tech Comments:  n/a    Nuclear Med Study 1 or 2 day study: 1 day  Stress Test Type:  Treadmill/Lexiscan  Reading MD: n/a  Order Authorizing Provider:  Verlin Dike  Resting Radionuclide: Technetium 25m Sestamibi  Resting Radionuclide Dose: 11.0 mCi   Stress Radionuclide:  Technetium 80m Sestamibi  Stress Radionuclide Dose: 33.0 mCi           Stress Protocol Rest HR: 68 Stress HR: 123  Rest BP: 126/89 Stress BP: 143/83  Exercise Time (min): n/a METS: n/a           Dose of Adenosine (mg):  n/a Dose of Lexiscan: 0.4 mg  Dose of Atropine (mg): n/a Dose of Dobutamine: n/a mcg/kg/min (at max HR)  Stress Test Technologist: Glade Lloyd, BS-ES  Nuclear Technologist:  Charlton Amor, CNMT     Rest Procedure:  Myocardial perfusion imaging was performed at rest 45 minutes following the intravenous administration of Technetium 49m Sestamibi. Rest ECG: NSR - Normal EKG  Stress Procedure:  The patient received IV Lexiscan 0.4 mg over 15-seconds with concurrent low level  exercise and then Technetium 50m Sestamibi was injected at 30-seconds while the patient continued walking one more minute.  Quantitative spect images were obtained after a 45-minute delay.  Patient attempted Bruce Protocol but could not get heart rate up before becoming fatigued to the point of having to stop.  Changed to Low Level Lexiscan. Patient complained of SOB and headache.  These began to resolve in recovery.  Stress ECG: No significant change from baseline ECG  QPS Raw Data Images:  Mild diaphragmatic attenuation.  Normal left ventricular size. Stress Images:  There is decreased uptake in the inferior wall. Rest Images:  There is decreased uptake in the inferior wall. Subtraction (SDS):  There is a fixed inferior defect that is most consistent with diaphragmatic attenuation. Transient Ischemic Dilatation (Normal <1.22):  1.00 Lung/Heart Ratio (Normal <0.45):  0.26  Quantitative Gated Spect Images QGS EDV:  96 ml QGS ESV:  44 ml  Impression Exercise Capacity:  Lexiscan with low level exercise. BP Response:  Normal blood pressure response. Clinical Symptoms:  There is dyspnea. ECG Impression:  No significant ST segment change suggestive of ischemia. Comparison with Prior Nuclear Study: No previous nuclear study performed  Overall Impression:  Low risk stress nuclear study with a fixed defect in the inferior and inferlateral wall consistent with diaphragmatic attenuation.  LV Ejection Fraction: 55%.  LV Wall  Motion:  NL LV Function; NL Wall Motion  Signed: Fransico Him, MD 07/15/2013

## 2013-07-19 ENCOUNTER — Encounter: Payer: Self-pay | Admitting: *Deleted

## 2014-02-09 ENCOUNTER — Encounter: Payer: Self-pay | Admitting: Cardiovascular Disease

## 2014-04-03 ENCOUNTER — Other Ambulatory Visit: Payer: Self-pay

## 2014-04-03 MED ORDER — ASPIRIN 81 MG PO TABS: 81.0000 mg | ORAL_TABLET | Freq: Every day | ORAL | Status: DC

## 2014-04-25 ENCOUNTER — Other Ambulatory Visit: Payer: Self-pay

## 2014-04-25 MED ORDER — CLOPIDOGREL BISULFATE 75 MG PO TABS: ORAL_TABLET | ORAL | Status: DC

## 2014-05-02 ENCOUNTER — Other Ambulatory Visit: Payer: Self-pay

## 2014-05-02 MED ORDER — EZETIMIBE-SIMVASTATIN 10-40 MG PO TABS: 1.0000 | ORAL_TABLET | Freq: Every day | ORAL | Status: DC

## 2014-05-03 ENCOUNTER — Other Ambulatory Visit: Payer: Self-pay | Admitting: *Deleted

## 2014-05-03 MED ORDER — EZETIMIBE-SIMVASTATIN 10-40 MG PO TABS: 1.0000 | ORAL_TABLET | Freq: Every day | ORAL | Status: DC

## 2014-05-16 ENCOUNTER — Other Ambulatory Visit: Payer: Self-pay

## 2014-05-16 MED ORDER — CLOPIDOGREL BISULFATE 75 MG PO TABS: ORAL_TABLET | ORAL | Status: DC

## 2014-05-29 NOTE — Progress Notes (Signed)
Patient ID: [name] [name], male   DOB: Jul 11, 1955, 58 y.o.   MRN: [id number] [name] [name] is seen today for F/U of CAD, HTN elevated lipids and dyspnea. He has a history of stenting of the circ and IM. He had a non-ischemic myovue in 2008. 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 He has not had a CXR this year He sees Dr [name] for apnea. Diagnosis of myeloma and [place] 6 [place]. No SSCP mostly fatigue and less SOB. Had CT scan for gi symptoms and ? Lots of coronary calcification. I suspect they were seeing his stents at least partly.  Reviewed myovue from 12/31/10 Lexiscan Normal with no ischemia EF 54 % Has some diabetic neuropathy  Primary [place]  Needs F/U with Dr [name] for COPD that has been a little worse in the summer. Sees [facility] for myeloma. Has F/U with [facility] for gi problems  BS is high with A1c over 7 He is more sedentary and does not watch his carbs the way he should Just prescribed tradjenta but has not started  Myovue 07/15/13 low risk reviewed  Overall Impression: Low risk stress nuclear study with a fixed defect in the inferior and inferlateral wall consistent with diaphragmatic attenuation.  LV Ejection Fraction: 55%. LV Wall Motion: NL LV Function; NL Wall Motion    ROS: Denies fever, malais, weight loss, blurry vision, decreased visual acuity, cough, sputum, SOB, hemoptysis, pleuritic pain, palpitaitons, heartburn, abdominal pain, melena, lower extremity edema, claudication, or rash.  All other systems reviewed and negative  General: Affect appropriate Healthy:  appears stated age 40: normal Neck supple with no adenopathy JVP normal no bruits no thyromegaly Lungs clear with no wheezing and good diaphragmatic motion Heart:  S1/S2 no murmur, no rub, gallop or click PMI normal Abdomen: benighn, BS positve, no tenderness, no AAA no bruit.  No HSM or  HJR Distal pulses intact with no bruits No edema Neuro non-focal Skin warm and dry No muscular weakness   Current Outpatient Prescriptions  Medication Sig Dispense Refill  . ALPRAZolam (XANAX) 0.5 MG tablet Take 0.5 mg by mouth daily as needed.      . ARIPiprazole (ABILIFY) 2 MG tablet Take 2 mg by mouth daily.    Marland Kitchen aspirin 81 MG tablet Take 1 tablet (81 mg total) by mouth daily. 90 tablet 0  . clopidogrel (PLAVIX) 75 MG tablet TAKE 1 TABLET DAILY 90 tablet 3  . ezetimibe-simvastatin (VYTORIN) 10-40 MG per tablet Take 1 tablet by mouth at bedtime. 90 tablet 0  . gabapentin (NEURONTIN) 300 MG capsule 3 TAB AM AND 4 TABS PM    . lisinopril (PRINIVIL,ZESTRIL) 10 MG tablet Take 5 mg by mouth daily.    . metFORMIN (GLUCOPHAGE) 500 MG tablet Take 1,000 mg by mouth 2 (two) times daily.     . nitroGLYCERIN (NITROSTAT) 0.4 MG SL tablet Place 1 tablet (0.4 mg total) under the tongue every 5 (five) minutes as needed for chest pain. 25 tablet 3  . oxyCODONE-acetaminophen (PERCOCET) 5-325 MG per tablet Take 1 tablet by mouth every 4 (four) hours as needed.      . pantoprazole (PROTONIX) 40 MG tablet Take 1 tablet (40 mg total) by mouth daily. 30 tablet 11  . sitaGLIPtin (JANUVIA) 100 MG tablet Take 100 mg by mouth daily.    . Tamsulosin HCl (FLOMAX) 0.4 MG CAPS Take 0.4 mg by  mouth daily.      . TRADJENTA 5 MG TABS tablet Take 1 tablet by mouth Daily.    . triazolam (HALCION) 0.25 MG tablet Take 0.5 mg by mouth at bedtime.       No current facility-administered medications for this visit.    Allergies  Codeine  Electrocardiogram:  02/12/13  SR rate 70 normal  Today SR rate 66 normal   Assessment and Plan

## 2014-05-31 ENCOUNTER — Ambulatory Visit (INDEPENDENT_AMBULATORY_CARE_PROVIDER_SITE_OTHER): Payer: Medicare Other | Admitting: Cardiovascular Disease

## 2014-05-31 ENCOUNTER — Encounter: Payer: Self-pay | Admitting: Cardiovascular Disease

## 2014-05-31 VITALS — BP 116/72 | HR 66 | Ht 72.0 in | Wt 171.0 lb

## 2014-05-31 DIAGNOSIS — E119 Type 2 diabetes mellitus without complications: Secondary | ICD-10-CM

## 2014-05-31 DIAGNOSIS — I10 Essential (primary) hypertension: Secondary | ICD-10-CM

## 2014-05-31 DIAGNOSIS — I2510 Atherosclerotic heart disease of native coronary artery without angina pectoris: Secondary | ICD-10-CM

## 2014-05-31 DIAGNOSIS — C9000 Multiple myeloma not having achieved remission: Secondary | ICD-10-CM

## 2014-05-31 MED ORDER — NITROGLYCERIN 0.4 MG SL SUBL: 0.4000 mg | SUBLINGUAL_TABLET | SUBLINGUAL | Status: DC | PRN

## 2014-05-31 NOTE — Assessment & Plan Note (Signed)
Well controlled.  Continue current medications and low sodium Dash type diet.    

## 2014-05-31 NOTE — Patient Instructions (Signed)
Your physician wants you to follow-up in: YEAR WITH DR NISHAN  You will receive a reminder letter in the mail two months in advance. If you don't receive a letter, please call our office to schedule the follow-up appointment.  Your physician recommends that you continue on your current medications as directed. Please refer to the Current Medication list given to you today. 

## 2014-05-31 NOTE — Assessment & Plan Note (Signed)
SPEP/UPEP stable f/u oncology no bone pain

## 2014-05-31 NOTE — Assessment & Plan Note (Signed)
Discussed low carb diet.  Target hemoglobin A1c is 6.5 or less.  Continue current medications.  

## 2014-05-31 NOTE — Assessment & Plan Note (Signed)
Stable with no angina and good activity level.  Continue medical Rx New nitro called into [facility]

## 2014-06-02 ENCOUNTER — Other Ambulatory Visit: Payer: Self-pay | Admitting: Cardiovascular Disease

## 2014-07-22 ENCOUNTER — Encounter: Payer: Self-pay | Admitting: Cardiovascular Disease

## 2014-08-08 ENCOUNTER — Telehealth: Payer: Self-pay

## 2014-08-08 NOTE — Telephone Encounter (Signed)
08/08/14 Disc Received from Dr. Reva Bores and filed on shelf. [name] [name]

## 2015-06-16 ENCOUNTER — Encounter: Payer: Self-pay | Admitting: *Deleted

## 2015-06-22 NOTE — Progress Notes (Signed)
Patient ID: [name] [name], male   DOB: 12-Sep-1955, 60 y.o.   MRN: [id number]   [name] [name] is seen today for F/U of CAD, HTN elevated lipids and dyspnea. He has a history of stenting of the circ and IM. He had a non-ischemic myovue in 2015 . 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 He has not had a CXR this year He sees Dr [name] for apnea. Diagnosis of myeloma and finished a 6 [place] course of [place]. No SSCP mostly fatigue and less SOB. Had CT scan for gi symptoms and ? Lots of coronary calcification. I suspect they were seeing his stents at least partly.   Has some diabetic neuropathy  Primary [place]  Needs F/U with Dr [name] for COPD that has been a little worse in the summer. Sees [facility] for myeloma. Has F/U with [facility] for gi problems  BS is high with A1c over 7 He is more sedentary and does not watch his carbs the way he should Just prescribed tradjenta but has not started  Myovue 07/15/13 low risk reviewed  Overall Impression: Low risk stress nuclear study with a fixed defect in the inferior and inferlateral wall consistent with diaphragmatic attenuation.  LV Ejection Fraction: 55%. LV Wall Motion: NL LV Function; NL Wall Motion   ROS: Denies fever, malais, weight loss, blurry vision, decreased visual acuity, cough, sputum, SOB, hemoptysis, pleuritic pain, palpitaitons, heartburn, abdominal pain, melena, lower extremity edema, claudication, or rash.  All other systems reviewed and negative  General: Affect appropriate Healthy:  appears stated age 49: normal Neck supple with no adenopathy JVP normal no bruits no thyromegaly Lungs clear with no wheezing and good diaphragmatic motion Heart:  S1/S2 no murmur, no rub, gallop or click PMI normal Abdomen: benighn, BS positve, no tenderness, no AAA no bruit.  No HSM or HJR Distal pulses intact with no bruits No edema Neuro non-focal Skin  warm and dry No muscular weakness   Current Outpatient Prescriptions  Medication Sig Dispense Refill  . ALPRAZolam (XANAX) 0.5 MG tablet Take 0.5 mg by mouth as directed.     Marland Kitchen aspirin 81 MG tablet Take 1 tablet (81 mg total) by mouth daily. 90 tablet 0  . clopidogrel (PLAVIX) 75 MG tablet Take 75 mg by mouth daily.    Marland Kitchen escitalopram (LEXAPRO) 20 MG tablet Take 20 mg by mouth daily.  11  . fentaNYL (DURAGESIC - DOSED MCG/HR) 50 MCG/HR Place 1 patch onto the skin as directed.  0  . gabapentin (NEURONTIN) 300 MG capsule TAKE 3 TABS BY MOUTH IN THE  AM & 4 TABS BY MOUTH IN THE PM    . metFORMIN (GLUCOPHAGE) 500 MG tablet Take 1,000 mg by mouth 2 (two) times daily.     . nitroGLYCERIN (NITROSTAT) 0.4 MG SL tablet Place 1 tablet (0.4 mg total) under the tongue every 5 (five) minutes as needed for chest pain. 25 tablet 3  . oxyCODONE-acetaminophen (PERCOCET) 5-325 MG per tablet Take 1 tablet by mouth every 4 (four) hours as needed for moderate pain or severe pain.     . pantoprazole (PROTONIX) 40 MG tablet Take 1 tablet (40 mg total) by mouth daily. 30 tablet 11  . QUEtiapine (SEROQUEL) 100 MG tablet Take 2 tablets by mouth every evening.  4  . simvastatin (ZOCOR) 20 MG tablet Take 20 mg by mouth daily.    . sitaGLIPtin (JANUVIA) 100  MG tablet Take 100 mg by mouth daily.    . Tamsulosin HCl (FLOMAX) 0.4 MG CAPS Take 0.4 mg by mouth daily.      . TRADJENTA 5 MG TABS tablet Take 1 tablet by mouth Daily.    Marland Kitchen zolpidem (AMBIEN) 10 MG tablet Take 10 mg by mouth at bedtime.  5   No current facility-administered medications for this visit.    Allergies  Codeine  Electrocardiogram:  02/12/13  SR rate 70 normal   05/2014  SR rate 66 normal   06/23/15  SR rate 72  PAC otherwise normal  Assessment and Plan CAD:  Distant history of stenting circumflex and IM  Non ischemic myovue 2015   DM:  Discussed low carb diet.  Target hemoglobin A1c is 6.5 or less.  Continue current medications. HTN:  Well  controlled.  Continue current medications and low sodium Dash type diet.   Chol: on vytorin labs with primary  COPD: no active wheezing stable  Myeloma:  F/U [facility] needs bone scan discussed with him PAC: asymptomatic on ECG today no evidence PAF  BP low no beta blocker for now  [employer]

## 2015-06-23 ENCOUNTER — Ambulatory Visit (INDEPENDENT_AMBULATORY_CARE_PROVIDER_SITE_OTHER): Payer: Medicare Other | Admitting: Cardiovascular Disease

## 2015-06-23 ENCOUNTER — Encounter: Payer: Self-pay | Admitting: Cardiovascular Disease

## 2015-06-23 VITALS — BP 96/60 | HR 60 | Ht 72.0 in | Wt 155.1 lb

## 2015-06-23 DIAGNOSIS — I10 Essential (primary) hypertension: Secondary | ICD-10-CM | POA: Diagnosis not present

## 2015-06-23 DIAGNOSIS — I2510 Atherosclerotic heart disease of native coronary artery without angina pectoris: Secondary | ICD-10-CM

## 2015-06-23 NOTE — Patient Instructions (Signed)

## 2015-07-17 ENCOUNTER — Encounter: Payer: Self-pay | Admitting: Cardiovascular Disease

## 2015-09-28 ENCOUNTER — Other Ambulatory Visit: Payer: Self-pay | Admitting: Family Medicine

## 2015-09-28 DIAGNOSIS — R1310 Dysphagia, unspecified: Secondary | ICD-10-CM

## 2015-10-03 ENCOUNTER — Other Ambulatory Visit: Payer: Medicare Other

## 2015-10-09 ENCOUNTER — Ambulatory Visit
Admission: RE | Admit: 2015-10-09 | Discharge: 2015-10-09 | Disposition: A | Discharge status: Home / Self Care | Payer: Medicare Other | Source: Ambulatory Visit | Attending: Family Medicine | Admitting: Family Medicine

## 2015-10-09 DIAGNOSIS — R1310 Dysphagia, unspecified: Secondary | ICD-10-CM

## 2015-12-13 ENCOUNTER — Encounter: Payer: Self-pay | Admitting: Hematology & Oncology

## 2016-01-01 ENCOUNTER — Ambulatory Visit: Payer: Medicare Other

## 2016-01-01 ENCOUNTER — Ambulatory Visit (HOSPITAL_BASED_OUTPATIENT_CLINIC_OR_DEPARTMENT_OTHER): Payer: Medicare Other

## 2016-01-01 ENCOUNTER — Encounter: Payer: Self-pay | Admitting: Hematology & Oncology

## 2016-01-01 ENCOUNTER — Ambulatory Visit (HOSPITAL_BASED_OUTPATIENT_CLINIC_OR_DEPARTMENT_OTHER): Payer: Medicare Other | Admitting: Hematology & Oncology

## 2016-01-01 VITALS — BP 119/70 | HR 76 | Temp 98.6°F | Resp 16 | Ht 72.0 in | Wt 151.0 lb

## 2016-01-01 DIAGNOSIS — C9000 Multiple myeloma not having achieved remission: Secondary | ICD-10-CM

## 2016-01-01 DIAGNOSIS — C9002 Multiple myeloma in relapse: Secondary | ICD-10-CM

## 2016-01-01 LAB — CBC WITH DIFFERENTIAL (CANCER CENTER ONLY)
BASO#: 0 10e3/uL (ref 0.0–0.2)
BASO%: 0.2 % (ref 0.0–2.0)
EOS%: 1.4 % (ref 0.0–7.0)
Eosinophils Absolute: 0.1 10e3/uL (ref 0.0–0.5)
HCT: 40.4 % (ref 38.7–49.9)
HGB: 13.7 g/dL (ref 13.0–17.1)
LYMPH#: 1.9 10e3/uL (ref 0.9–3.3)
LYMPH%: 20.8 % (ref 14.0–48.0)
MCH: 31.6 pg (ref 28.0–33.4)
MCHC: 33.9 g/dL (ref 32.0–35.9)
MCV: 93 fL (ref 82–98)
MONO#: 0.7 10e3/uL (ref 0.1–0.9)
MONO%: 7.6 % (ref 0.0–13.0)
NEUT#: 6.5 10e3/uL (ref 1.5–6.5)
NEUT%: 70 % (ref 40.0–80.0)
Platelets: 246 10e3/uL (ref 145–400)
RBC: 4.33 10e6/uL (ref 4.20–5.70)
RDW: 13.7 % (ref 11.1–15.7)
WBC: 9.3 10e3/uL (ref 4.0–10.0)

## 2016-01-01 LAB — COMPREHENSIVE METABOLIC PANEL (CC13)
ALBUMIN: 4.5 g/dL (ref 3.6–4.8)
ALT: 14 IU/L (ref 0–44)
AST (SGOT): 19 IU/L (ref 0–40)
Albumin/Globulin Ratio: 1.4 (ref 1.2–2.2)
Alkaline Phosphatase, S: 60 IU/L (ref 39–117)
BUN / CREAT RATIO: 16 (ref 10–24)
BUN: 13 mg/dL (ref 8–27)
Bilirubin Total: 0.2 mg/dL (ref 0.0–1.2)
CO2: 28 mmol/L (ref 18–29)
CREATININE: 0.79 mg/dL (ref 0.76–1.27)
Calcium, Ser: 9.5 mg/dL (ref 8.6–10.2)
Chloride, Ser: 100 mmol/L (ref 96–106)
GFR, EST AFRICAN AMERICAN: 113 mL/min/{1.73_m2} (ref >59)
GFR, EST NON AFRICAN AMERICAN: 98 mL/min/{1.73_m2} (ref >59)
GLOBULIN, TOTAL: 3.3 g/dL (ref 1.5–4.5)
GLUCOSE: 132 mg/dL — AB (ref 65–99)
Potassium, Ser: 4.7 mmol/L (ref 3.5–5.2)
SODIUM: 135 mmol/L (ref 134–144)
TOTAL PROTEIN: 7.8 g/dL (ref 6.0–8.5)

## 2016-01-01 MED ORDER — HEPARIN SOD (PORK) LOCK FLUSH 100 UNIT/ML IV SOLN
500.0000 [IU] | Freq: Once | INTRAVENOUS | Status: AC
Start: 2016-01-01 — End: 2016-01-01
  Administered 2016-01-01: 500 [IU] via INTRAVENOUS
  Filled 2016-01-01: qty 5

## 2016-01-01 MED ORDER — LIDOCAINE-PRILOCAINE 2.5-2.5 % EX CREA: TOPICAL_CREAM | CUTANEOUS | 6 refills | Status: DC

## 2016-01-01 MED ORDER — FAMCICLOVIR 500 MG PO TABS
ORAL_TABLET | ORAL | 6 refills | Status: DC
Start: 2016-01-01 — End: 2016-11-28

## 2016-01-01 MED ORDER — SODIUM CHLORIDE 0.9% FLUSH
10.0000 mL | INTRAVENOUS | Status: DC | PRN
  Administered 2016-01-01: 10 mL via INTRAVENOUS
  Filled 2016-01-01: qty 10

## 2016-01-01 NOTE — Progress Notes (Signed)
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 Other    New Patient  : My doctor is leaving.  HPI: [name] [name] is a very nice 60 year old white male. He has been followed for about 6 years by Dr. [name] in [facility]. 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 He was having some worsening neuropathy. He was then placed on Velcade and Decadron.  He is then switched to Revlimid. He is having some abdominal pain with the Revlimid. It seemed as if he got a little bit better.  He's been followed off therapy since. He does have quite a few other health issues. He does have coronary artery disease. He's had 2 myocardial infarctions. He had stents placed.  He has diabetes. He is not on insulin.  He has severe lower back issues. 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  In 2016, he had a bone survey done. This did not show any evidence of myelomatous bone involvement.  He had a bone marrow biopsy done on 08/14/2015. This showed 7 having 10% plasma cells. It was negative for FISH.  His doctor is leaving because of health reasons. He was kindly referred to the [facility] for an evaluation.  He actually feels okay are not. He's had this issue with shingles recurrence on his face. He says that it might be getting a little bit worse right now. It seems to be more so on the left side of his face.  There is been no issues with cough or shortness of breath. He is constipated.  He does see a pain doctor. He is on fentanyl patch.  His appetite is fairly good.  Overall, his performance status is ECOG 1.    Past Medical History:  Diagnosis Date  . CAD (coronary artery disease)    stents '99 and '05; patent circ and IM stents cath. [place] 10/08 non ischemic  . Chronic back pain   . Depression   . DM (diabetes mellitus) ([place])   . GERD (gastroesophageal reflux disease)   . HLD  (hyperlipidemia)   . Insomnia   . Multiple myeloma    dx 11/11. followed by Dr. [name] in [place]  . OSA (obstructive sleep apnea)    AHI 5 from home sleep test 10/05/09  . Peripheral neuropathy ([place])   . Prostatism   . RLS (restless legs syndrome)    responded to iron supplementation  :  Past Surgical History:  Procedure Laterality Date  . L4-5 epidural steriod injection     with fluoroscopic guidance  . release of rt long finger A1     pulley with debridement of tenosynovitis  . stent placed     95% lesion in AV circumflex that was dilated and stented with an AVE stent. also had 60% lesion in the OM-I and minor disease in LAD and RCA 02/14/98. Dr Velora Heckler cc Dr. Percival Spanish   :   Current Outpatient Prescriptions:  .  ALPRAZolam (XANAX) 0.5 MG tablet, Take 0.5 mg by mouth as directed. , Disp: , Rfl:  .  aspirin 81 MG tablet, Take 1 tablet (81 mg total) by mouth daily., Disp: 90 tablet, Rfl: 0 .  clopidogrel (PLAVIX) 75 MG tablet, Take 75 mg by mouth daily., Disp: , Rfl:  .  escitalopram (LEXAPRO) 20 MG tablet, Take 20 mg by mouth daily., Disp: , Rfl: 11 .  fentaNYL (DURAGESIC -  DOSED MCG/HR) 50 MCG/HR, Place 1 patch onto the skin as directed., Disp: , Rfl: 0 .  gabapentin (NEURONTIN) 300 MG capsule, TAKE 3 TABS BY MOUTH IN THE  AM & 4 TABS BY MOUTH IN THE PM, Disp: , Rfl:  .  metFORMIN (GLUCOPHAGE) 500 MG tablet, Take 1,000 mg by mouth 2 (two) times daily. , Disp: , Rfl:  .  nitroGLYCERIN (NITROSTAT) 0.4 MG SL tablet, Place 1 tablet (0.4 mg total) under the tongue every 5 (five) minutes as needed for chest pain., Disp: 25 tablet, Rfl: 3 .  oxyCODONE-acetaminophen (PERCOCET) 5-325 MG per tablet, Take 1 tablet by mouth every 4 (four) hours as needed for moderate pain or severe pain. , Disp: , Rfl:  .  pantoprazole (PROTONIX) 40 MG tablet, Take 1 tablet (40 mg total) by mouth daily., Disp: 30 tablet, Rfl: 11 .  simvastatin (ZOCOR) 20 MG tablet, Take 20 mg by mouth daily., Disp: , Rfl:  .   sitaGLIPtin (JANUVIA) 100 MG tablet, Take 100 mg by mouth daily., Disp: , Rfl:  .  Tamsulosin HCl (FLOMAX) 0.4 MG CAPS, Take 0.4 mg by mouth daily.  , Disp: , Rfl:  .  zolpidem (AMBIEN) 10 MG tablet, Take 10 mg by mouth at bedtime., Disp: , Rfl: 5 .  famciclovir (FAMVIR) 500 MG tablet, Take 2 tablets 3 times a day for 7 days, then take 1 tablet a day., Disp: 60 tablet, Rfl: 6 No current facility-administered medications for this visit.   Facility-Administered Medications Ordered in Other Visits:  .  heparin lock flush 100 unit/mL, 500 Units, Intravenous, Once, [name], MD .  sodium chloride flush (NS) 0.9 % injection 10 mL, 10 mL, Intravenous, PRN, [name], MD:  :  Allergies  Allergen Reactions  . Aripiprazole     Other reaction(s): leg aching  . Codeine     REACTION: hives  . Duloxetine     Other reaction(s): ineffective  . Fluoxetine     Other reaction(s): ineffective  :  Family History  Problem Relation Age of Onset  . Heart disease Father   . Diabetes Father   . Hypertension Mother   :  Social History   Social History  . Marital status: Married    Spouse name: N/A  . Number of children: N/A  . Years of education: N/A   Occupational History  . Not on file.   Social History Main Topics  . Smoking status: Former Research scientist (life sciences)  . Smokeless tobacco: Never Used     Comment: no intake of tobacco produtcs; does chew nicotene gum   . Alcohol use No  . Drug use: Unknown  . Sexual activity: Not on file   Other Topics Concern  . 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   :  Pertinent items are noted in HPI.  Exam: '@IPVITALS' @  thin but well-nourished white male in no obvious distress. Vital signs show a temperature of 98.6. Pulse 76. Blood pressure 119/70. Weight is 151 pounds. Head and neck exam shows no ocular or oral lesions. There are no palpable cervical or or  supraclavicular lymph nodes. Lungs are clear to percussion and auscultation bilaterally. Cardiac exam regular rate and rhythm with a normal S1 and S2. There are no murmurs, rubs or bruits. Abdomen is soft. He has good bowel sounds. There is no fluid wave. There is no palpable liver or spleen tip.  Back exam shows no tenderness over the spine, ribs or hips. Extremities shows no clubbing, cyanosis or edema. Has good range of motion of his joints. Skin exam shows no rashes, ecchymoses or petechia. Neurological exam is nonfocal.    Recent Labs  01/01/16 1526  WBC 9.3  HGB 13.7  HCT 40.4  PLT 246   No results for input(s): NA, K, CL, CO2, GLUCOSE, BUN, CREATININE, CALCIUM in the last 72 hours.  Blood smear review:  None  Pathology: None     Assessment and Plan:  [name] [name] is a 60 year old white male with an IgG Kappa smoldering myeloma. He has had this for 6 years. He has done very well with this. He was treated initially with Velcade and Decadron. This was followed by some Revlimid. I'll think he was on Revlimid on that long.  His last bone marrow test just done 5 months ago is reassuring. He only has 7-10% plasma cells.  I am going to go ahead and get studies on him. I don't think we have to get an MRI or PET scan or bone survey on him.  He does have a Port-A-Cath. We will go ahead and flush this today.  I will like to see him back in 2 months. I did this would be a reasonable amount, follow-up.  I spent about 45 minutes with him today. He is very nice. He came in with his wife. We are to very nice time.

## 2016-01-01 NOTE — Progress Notes (Signed)
[name] [name] presented for Portacath access and flush. Proper placement of portacath confirmed by CXR. Portacath located in the left chest wall accessed with  H 20 needle. Clean, Dry and Intact No blood return  Portacath flushed with 58ml NS and 500U/9ml Heparin per protocol and needle removed intact. Procedure without incident. Patient tolerated procedure well.

## 2016-01-01 NOTE — Patient Instructions (Signed)

## 2016-01-02 LAB — KAPPA/LAMBDA LIGHT CHAINS
Ig Kappa Free Light Chain: 64.4 mg/L — ABNORMAL HIGH (ref 3.3–19.4)
Ig Lambda Free Light Chain: 8.2 mg/L (ref 5.7–26.3)
KAPPA/LAMBDA FLC RATIO: 7.85 — AB (ref 0.26–1.65)

## 2016-01-03 ENCOUNTER — Other Ambulatory Visit: Payer: Medicare Other

## 2016-01-03 LAB — MULTIPLE MYELOMA PANEL, SERUM
ALBUMIN/GLOB SERPL: 1.2 (ref 0.7–1.7)
ALPHA 1: 0.2 g/dL (ref 0.0–0.4)
ALPHA2 GLOB SERPL ELPH-MCNC: 0.8 g/dL (ref 0.4–1.0)
Albumin SerPl Elph-Mcnc: 3.7 g/dL (ref 2.9–4.4)
B-Globulin SerPl Elph-Mcnc: 1 g/dL (ref 0.7–1.3)
GAMMA GLOB SERPL ELPH-MCNC: 1.2 g/dL (ref 0.4–1.8)
Globulin, Total: 3.3 g/dL (ref 2.2–3.9)
IGM (IMMUNOGLOBIN M), SRM: 21 mg/dL (ref 20–172)
IgA, Qn, Serum: 101 mg/dL (ref 90–386)
M Protein SerPl Elph-Mcnc: 0.9 g/dL — ABNORMAL HIGH
Total Protein: 7 g/dL (ref 6.0–8.5)

## 2016-01-04 LAB — UIFE/LIGHT CHAINS/TP QN, 24-HR UR
FR KAPPA LT CH,24HR: 28 mg/24 hr
FR LAMBDA LT CH,24HR: 1 mg/24 hr
FREE LAMBDA LT CHAINS, UR: 0.36 mg/L (ref 0.24–6.66)
Free Kappa Lt Chains,Ur: 11.6 mg/L (ref 1.35–24.19)
KAPPA/LAMBDA RATIO, U: 32.22 — AB (ref 2.04–10.37)
PROTEIN UR: 9.7 mg/dL
Prot,24hr calculated: 238 mg/24 hr — ABNORMAL HIGH (ref 30–150)

## 2016-01-08 ENCOUNTER — Telehealth: Payer: Self-pay | Admitting: Nurse Practitioner

## 2016-01-08 NOTE — Telephone Encounter (Addendum)
Pt verbalized understanding and appreciation. ---- Message from [name], MD sent at 01/03/2016 12:44 PM EDT ----- Call - myeloma protein is still low!!  I am NOT worried about this!!  Laurey Arrow

## 2016-02-19 ENCOUNTER — Telehealth: Payer: Self-pay | Admitting: Hematology & Oncology

## 2016-02-19 NOTE — Telephone Encounter (Signed)
Patient called and cx 02/28/16 apt and resch for 03/21/16

## 2016-02-28 ENCOUNTER — Other Ambulatory Visit: Payer: Medicare Other

## 2016-02-28 ENCOUNTER — Ambulatory Visit: Payer: Medicare Other | Admitting: Family

## 2016-03-21 ENCOUNTER — Other Ambulatory Visit: Payer: Self-pay | Admitting: Oncology

## 2016-03-21 ENCOUNTER — Encounter: Payer: Self-pay | Admitting: Family

## 2016-03-21 ENCOUNTER — Ambulatory Visit: Payer: Medicare Other

## 2016-03-21 ENCOUNTER — Other Ambulatory Visit (HOSPITAL_BASED_OUTPATIENT_CLINIC_OR_DEPARTMENT_OTHER): Payer: Medicare Other

## 2016-03-21 ENCOUNTER — Ambulatory Visit (HOSPITAL_BASED_OUTPATIENT_CLINIC_OR_DEPARTMENT_OTHER): Payer: Medicare Other | Admitting: Family

## 2016-03-21 VITALS — BP 113/76 | HR 69 | Temp 98.0°F | Resp 16 | Ht 72.0 in | Wt 150.0 lb

## 2016-03-21 DIAGNOSIS — C9002 Multiple myeloma in relapse: Secondary | ICD-10-CM

## 2016-03-21 DIAGNOSIS — C9000 Multiple myeloma not having achieved remission: Secondary | ICD-10-CM

## 2016-03-21 DIAGNOSIS — Z95828 Presence of other vascular implants and grafts: Secondary | ICD-10-CM

## 2016-03-21 LAB — CBC WITH DIFFERENTIAL (CANCER CENTER ONLY)
BASO#: 0 10*3/uL (ref 0.0–0.2)
BASO%: 0.3 % (ref 0.0–2.0)
EOS%: 0.8 % (ref 0.0–7.0)
Eosinophils Absolute: 0.1 10*3/uL (ref 0.0–0.5)
HCT: 40.8 % (ref 38.7–49.9)
HEMOGLOBIN: 13.9 g/dL (ref 13.0–17.1)
LYMPH#: 1.2 10*3/uL (ref 0.9–3.3)
LYMPH%: 18.6 % (ref 14.0–48.0)
MCH: 31.7 pg (ref 28.0–33.4)
MCHC: 34.1 g/dL (ref 32.0–35.9)
MCV: 93 fL (ref 82–98)
MONO#: 0.5 10*3/uL (ref 0.1–0.9)
MONO%: 8.1 % (ref 0.0–13.0)
NEUT%: 72.2 % (ref 40.0–80.0)
NEUTROS ABS: 4.5 10*3/uL (ref 1.5–6.5)
PLATELETS: 239 10*3/uL (ref 145–400)
RBC: 4.39 10*6/uL (ref 4.20–5.70)
RDW: 13.1 % (ref 11.1–15.7)
WBC: 6.3 10*3/uL (ref 4.0–10.0)

## 2016-03-21 LAB — COMPREHENSIVE METABOLIC PANEL
ALBUMIN: 4.1 g/dL (ref 3.5–5.0)
ALK PHOS: 57 U/L (ref 40–150)
ALT: 7 U/L (ref 0–55)
AST: 17 U/L (ref 5–34)
Anion Gap: 11 mEq/L (ref 3–11)
BILIRUBIN TOTAL: 0.43 mg/dL (ref 0.20–1.20)
BUN: 13.7 mg/dL (ref 7.0–26.0)
CO2: 24 meq/L (ref 22–29)
Calcium: 9.6 mg/dL (ref 8.4–10.4)
Chloride: 105 mEq/L (ref 98–109)
Creatinine: 0.8 mg/dL (ref 0.7–1.3)
GLUCOSE: 96 mg/dL (ref 70–140)
Potassium: 4.4 mEq/L (ref 3.5–5.1)
SODIUM: 140 meq/L (ref 136–145)
TOTAL PROTEIN: 8 g/dL (ref 6.4–8.3)

## 2016-03-21 MED ORDER — SODIUM CHLORIDE 0.9% FLUSH
10.0000 mL | INTRAVENOUS | Status: DC | PRN
  Administered 2016-03-21: 10 mL via INTRAVENOUS
  Filled 2016-03-21: qty 10

## 2016-03-21 NOTE — Progress Notes (Signed)
Hematology and Oncology Follow Up Visit  [name] [name] [id number] May 30, 1956 60 y.o. 03/21/2016   Principle Diagnosis:  Smoldering IgG Kappa Myeloma   Current Therapy:   Observation    Interim History:  [name] [name] is here today for follow-up. He is doing fairly well but having some fatigue. He also states that he is feeling "blue." He denies feelings of self harm or harming others. He is just "having a hard time aging."  He states that he has a great family and support system. He and his wife are very involved in their church.  His M-spike in July was 0.9. His CBC and CMP today look good. No anemia.  He is diabetic and takes Metformin and Januvia. He states that he does not check his blood glucose on a regular basis.   He still has the rash on the right side of his face and neck. This has not resolved despite his being on Famvir for quite a while. He has an appointment with ID on 10/30 for evaluation.  The neuropathy in his feet is unchanged. No swelling in his extremities.  No fever, chills, n/v, cough, dizziness, headache, SOB, chest pain, palpitations, abdominal pain or changes in bowel or bladder habits. He has occasional constipation and takes Miralax as needed.  His appetite comes and goes. He states that he is staying hydrated. His weight is stable at 150 lbs.  He is keeping busy with work and his [place].   Medications:    Medication List       Accurate as of 03/21/16 11:55 AM. Always use your most recent med list.          ALPRAZolam 0.5 MG tablet Commonly known as:  XANAX Take 0.5 mg by mouth as directed.   aspirin 81 MG tablet Take 1 tablet (81 mg total) by mouth daily.   clopidogrel 75 MG tablet Commonly known as:  PLAVIX Take 75 mg by mouth daily.   escitalopram 20 MG tablet Commonly known as:  LEXAPRO Take 20 mg by mouth daily.   famciclovir 500 MG tablet Commonly known as:  FAMVIR Take 2 tablets 3 times a day for 7 days, then take 1  tablet a day.   fentaNYL 50 MCG/HR Commonly known as:  DURAGESIC - dosed mcg/hr Place 1 patch onto the skin as directed.   FLOMAX 0.4 MG Caps capsule Generic drug:  tamsulosin Take 0.4 mg by mouth daily.   gabapentin 300 MG capsule Commonly known as:  NEURONTIN TAKE 3 TABS BY MOUTH IN THE  AM & 4 TABS BY MOUTH IN THE PM   lidocaine-prilocaine cream Commonly known as:  EMLA Apply to port a cath site 1 hour prior to coming to office.   metFORMIN 500 MG tablet Commonly known as:  GLUCOPHAGE Take 1,000 mg by mouth 2 (two) times daily.   nitroGLYCERIN 0.4 MG SL tablet Commonly known as:  NITROSTAT Place 1 tablet (0.4 mg total) under the tongue every 5 (five) minutes as needed for chest pain.   oxyCODONE-acetaminophen 5-325 MG tablet Commonly known as:  PERCOCET/ROXICET Take 1 tablet by mouth every 4 (four) hours as needed for moderate pain or severe pain.   pantoprazole 40 MG tablet Commonly known as:  PROTONIX Take 1 tablet (40 mg total) by mouth daily.   simvastatin 20 MG tablet Commonly known as:  ZOCOR Take 20 mg by mouth daily.   sitaGLIPtin 100 MG tablet Commonly known as:  JANUVIA Take 100 mg by mouth  daily.   zolpidem 10 MG tablet Commonly known as:  AMBIEN Take 10 mg by mouth at bedtime.       Allergies:  Allergies  Allergen Reactions  . Aripiprazole     Other reaction(s): leg aching  . Codeine     REACTION: hives  . Duloxetine     Other reaction(s): ineffective  . Fluoxetine     Other reaction(s): ineffective    Past Medical History, Surgical history, Social history, and Family History were reviewed and updated.  Review of Systems: All other 10 point review of systems is negative.   Physical Exam:  vitals were not taken for this visit.  Wt Readings from Last 3 Encounters:  01/01/16 151 lb 0.6 oz (68.5 kg)  06/23/15 155 lb 1.9 oz (70.4 kg)  05/31/14 171 lb (77.6 kg)    Ocular: Sclerae unicteric, pupils equal, round and reactive to  light Ear-nose-throat: Oropharynx clear, dentition fair Lymphatic: No cervical or supraclavicular adenopathy Lungs no rales or rhonchi, good excursion bilaterally Heart regular rate and rhythm, no murmur appreciated Abd soft, nontender, positive bowel sounds, no liver or spleen tip palpated on exam MSK no focal spinal tenderness, no joint edema Neuro: non-focal, well-oriented, appropriate affect Breasts: Deferred  Lab Results  Component Value Date   WBC 6.3 03/21/2016   HGB 13.9 03/21/2016   HCT 40.8 03/21/2016   MCV 93 03/21/2016   PLT 239 03/21/2016   Lab Results  Component Value Date   FERRITIN 73.9 11/20/2009   IRON 76 11/20/2009   IRONPCTSAT 22.8 11/20/2009   Lab Results  Component Value Date   RBC 4.39 03/21/2016   Lab Results  Component Value Date   KAPLAMBRATIO 7.85 (H) 01/01/2016   Lab Results  Component Value Date   IGGSERUM 1,258 01/01/2016   IGMSERUM 21 01/01/2016   No results found for: [name] [name], SPEI   Chemistry      Component Value Date/Time   NA 135 01/01/2016 1526   K 4.7 01/01/2016 1526   CL 100 01/01/2016 1526   CO2 28 01/01/2016 1526   BUN 13 01/01/2016 1526   CREATININE 0.79 01/01/2016 1526      Component Value Date/Time   CALCIUM 9.5 01/01/2016 1526   ALKPHOS 60 01/01/2016 1526   AST 19 01/01/2016 1526   ALT 14 01/01/2016 1526   BILITOT 0.2 01/01/2016 1526     Impression and Plan: [name] [name] is a 60 yo white male with an IgG Kappa smoldering myeloma diagnosed 6 years ago. He was treated initially with Velcade and Decadron and then Revlimid. So far, he has done well. His last bone marrow test in March showed only 7-10% plasma cells.  His CBC and CMP today looked great. No anemia. His myeloma studies are pending.  He continues to have intermittent fatigue and his neuropathy is unchanged.  He will continue to have his port flushed every 8 weeks.  We will continue to follow along  with him and plan to see him back in 4 months for repeat lab work and follow-up.  He will contact our office with any questions or concerns. We can certainly see her sooner if need be.   Eliezer Bottom, NP 10/19/201711:55 AM

## 2016-03-26 LAB — MULTIPLE MYELOMA PANEL, SERUM
ALBUMIN SERPL ELPH-MCNC: 4.2 g/dL (ref 2.9–4.4)
ALBUMIN/GLOB SERPL: 1.3 (ref 0.7–1.7)
ALPHA 1: 0.2 g/dL (ref 0.0–0.4)
ALPHA2 GLOB SERPL ELPH-MCNC: 0.8 g/dL (ref 0.4–1.0)
B-GLOBULIN SERPL ELPH-MCNC: 1 g/dL (ref 0.7–1.3)
GAMMA GLOB SERPL ELPH-MCNC: 1.3 g/dL (ref 0.4–1.8)
GLOBULIN, TOTAL: 3.4 g/dL (ref 2.2–3.9)
IGA/IMMUNOGLOBULIN A, SERUM: 101 mg/dL (ref 90–386)
IgG, Qn, Serum: 1351 mg/dL (ref 700–1600)
IgM, Qn, Serum: 21 mg/dL (ref 20–172)
M PROTEIN SERPL ELPH-MCNC: 0.9 g/dL — AB
Total Protein: 7.6 g/dL (ref 6.0–8.5)

## 2016-04-01 ENCOUNTER — Ambulatory Visit (INDEPENDENT_AMBULATORY_CARE_PROVIDER_SITE_OTHER): Payer: Medicare Other | Admitting: Internal Medicine

## 2016-04-01 ENCOUNTER — Encounter: Payer: Self-pay | Admitting: Family

## 2016-04-01 ENCOUNTER — Encounter: Payer: Self-pay | Admitting: Internal Medicine

## 2016-04-01 DIAGNOSIS — B029 Zoster without complications: Secondary | ICD-10-CM

## 2016-04-01 DIAGNOSIS — D472 Monoclonal gammopathy: Secondary | ICD-10-CM | POA: Insufficient documentation

## 2016-04-01 DIAGNOSIS — C9000 Multiple myeloma not having achieved remission: Secondary | ICD-10-CM | POA: Insufficient documentation

## 2016-04-01 NOTE — Progress Notes (Signed)
[facility]      Reason for Consult:Recurrent/continuous shingles    Referring Physician: Dr. Brigitte Pulse    Patient ID: [name] [name], male    DOB: Mar 08, 1956, 60 y.o.   MRN: [id number]  HPI:   He first noted shingles on July 1 of this year with painful lesions along the right side of his neck into his head.  Since that time it has essentially been constant, though some waxing and waning of severity.  Not really painful but mainly itchy.  Had shingles vaccine in the past.  Was taking Valtrex and recently on famciclovir suppression twice daily.  He has noted some worsening with placement of the Fentanyl patch when he changes sides.  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   Previous record reviewed from PCP, oncology.  He has been on daily famciclovir for suppression after a treatment course.  Previously on Valtrex.    Past Medical History:  Diagnosis Date  . CAD (coronary artery disease)    stents '99 and '05; patent circ and IM stents cath. [facility] 10/08 non ischemic  . Chronic back pain   . Depression   . DM (diabetes mellitus) ([place])   . GERD (gastroesophageal reflux disease)   . HLD (hyperlipidemia)   . Insomnia   . Multiple myeloma    dx 11/11. followed by Dr. [name] in [place]  . OSA (obstructive sleep apnea)    AHI 5 from home sleep test 10/05/09  . Peripheral neuropathy ([place])   . Prostatism   . RLS (restless legs syndrome)    responded to iron supplementation    Prior to Admission medications   Medication Sig Start Date End Date Taking? Authorizing Provider  ALPRAZolam Duanne Moron) 0.5 MG tablet Take 0.5 mg by mouth as directed.    Yes Historical Provider, MD  aspirin 81 MG tablet Take 1 tablet (81 mg total) by mouth daily. 04/03/14  Yes Josue Hector, MD  clopidogrel (PLAVIX) 75 MG tablet Take 75 mg by mouth daily.   Yes Historical Provider, MD  escitalopram (LEXAPRO) 20 MG tablet Take 20  mg by mouth daily. 05/02/14  Yes Historical Provider, MD  famciclovir (FAMVIR) 500 MG tablet Take 2 tablets 3 times a day for 7 days, then take 1 tablet a day. 01/01/16  Yes [name], MD  fentaNYL (DURAGESIC - DOSED MCG/HR) 50 MCG/HR Place 1 patch onto the skin as directed. 05/11/15  Yes Historical Provider, MD  gabapentin (NEURONTIN) 300 MG capsule TAKE 3 TABS BY MOUTH IN THE  AM & 4 TABS BY MOUTH IN THE PM   Yes Historical Provider, MD  lidocaine-prilocaine (EMLA) cream Apply to port a cath site 1 hour prior to coming to office. 01/01/16  Yes [name], MD  metFORMIN (GLUCOPHAGE) 500 MG tablet Take 1,000 mg by mouth 2 (two) times daily.    Yes Historical Provider, MD  nitroGLYCERIN (NITROSTAT) 0.4 MG SL tablet Place 1 tablet (0.4 mg total) under the tongue every 5 (five) minutes as needed for chest pain. 05/31/14  Yes Josue Hector, MD  oxyCODONE-acetaminophen (PERCOCET) 5-325 MG per tablet Take 1 tablet by mouth every 4 (four) hours as needed for moderate pain or severe pain.    Yes Historical Provider, MD  pantoprazole (PROTONIX) 40 MG tablet Take 1 tablet (40 mg total) by mouth daily. 03/01/13  Yes Josue Hector, MD  simvastatin (ZOCOR) 20 MG tablet  Take 20 mg by mouth daily. 06/22/15  Yes Historical Provider, MD  sitaGLIPtin (JANUVIA) 100 MG tablet Take 100 mg by mouth daily.   Yes Historical Provider, MD  Tamsulosin HCl (FLOMAX) 0.4 MG CAPS Take 0.4 mg by mouth daily.     Yes Historical Provider, MD  zolpidem (AMBIEN) 10 MG tablet Take 10 mg by mouth at bedtime. 05/15/14  Yes Historical Provider, MD    Allergies  Allergen Reactions  . Aripiprazole     Other reaction(s): leg aching  . Codeine     REACTION: hives  . Duloxetine     Other reaction(s): ineffective  . Fluoxetine     Other reaction(s): ineffective    Social History  Substance Use Topics  . Smoking status: Former Research scientist (life sciences)  . Smokeless tobacco: Never Used     Comment: no intake of tobacco produtcs; does chew  nicotene gum   . Alcohol use No    Family History  Problem Relation Age of Onset  . Heart disease Father   . Diabetes Father   . Hypertension Mother     Review of Systems  Constitutional: negative for fatigue, malaise and anorexia Gastrointestinal: negative for diarrhea Musculoskeletal: negative for myalgias and arthralgias All other systems reviewed and are negative   Constitutional: in no apparent distress and alert  Vitals:   04/01/16 0901  BP: 112/73  Pulse: 71  Temp: 97.6 F (36.4 C)   EYES: anicteric ENMT: no thrush Cardiovascular: Cor RRR Respiratory: CTA B; normal respiratory effort GI: soft Musculoskeletal: no pedal edema noted Skin: lesions on right neck with small papillary lesion along dermatome Hematologic: no cervical lad  Labs: Lab Results  Component Value Date   WBC 6.3 03/21/2016   HGB 13.9 03/21/2016   HCT 40.8 03/21/2016   MCV 93 03/21/2016   PLT 239 03/21/2016    Lab Results  Component Value Date   CREATININE 0.8 03/21/2016   BUN 13.7 03/21/2016   NA 140 03/21/2016   K 4.4 03/21/2016   CL 100 01/01/2016   CO2 24 03/21/2016    Lab Results  Component Value Date   ALT 7 03/21/2016   AST 17 03/21/2016   ALKPHOS 57 03/21/2016   BILITOT 0.43 03/21/2016     Assessment: shingles, recurrent.  I discussed shingles and etiology.  I discussed vaccine and treatment options.  Unfortunately, options for treatment and prevention are limited to what he has already tried.  Valtrex and famciclovir are optimal options.  Suppression can be three times a day with either, if tolerated.  I do not know of an association with Fentanyl patch but could be.   Plan: 1) continue care as before which can be either Valtrex 1 gram three times a day until lesions resolve for at least 1 month and then can do 500 mg twice a day or try famciclovir continuous three times a day until lesions resolve for at least one month and then daily.  This could take several months.     2) can try stopping fantanyl patch and see if there is any association.  I suggested 1 week without it.   Thanks for referral

## 2016-07-25 ENCOUNTER — Other Ambulatory Visit (HOSPITAL_BASED_OUTPATIENT_CLINIC_OR_DEPARTMENT_OTHER): Payer: Medicare Other

## 2016-07-25 ENCOUNTER — Ambulatory Visit (HOSPITAL_BASED_OUTPATIENT_CLINIC_OR_DEPARTMENT_OTHER): Payer: Medicare Other | Admitting: Hematology & Oncology

## 2016-07-25 ENCOUNTER — Ambulatory Visit: Payer: Medicare Other

## 2016-07-25 ENCOUNTER — Ambulatory Visit (HOSPITAL_BASED_OUTPATIENT_CLINIC_OR_DEPARTMENT_OTHER)
Admission: RE | Admit: 2016-07-25 | Discharge: 2016-07-25 | Disposition: A | Discharge status: Home / Self Care | Payer: Medicare Other | Source: Ambulatory Visit | Attending: Hematology & Oncology | Admitting: Hematology & Oncology

## 2016-07-25 VITALS — BP 132/76 | HR 76 | Temp 98.0°F

## 2016-07-25 DIAGNOSIS — C9000 Multiple myeloma not having achieved remission: Secondary | ICD-10-CM | POA: Insufficient documentation

## 2016-07-25 DIAGNOSIS — D472 Monoclonal gammopathy: Secondary | ICD-10-CM

## 2016-07-25 DIAGNOSIS — X58XXXA Exposure to other specified factors, initial encounter: Secondary | ICD-10-CM | POA: Insufficient documentation

## 2016-07-25 DIAGNOSIS — S2241XA Multiple fractures of ribs, right side, initial encounter for closed fracture: Secondary | ICD-10-CM | POA: Diagnosis not present

## 2016-07-25 DIAGNOSIS — G629 Polyneuropathy, unspecified: Secondary | ICD-10-CM | POA: Diagnosis not present

## 2016-07-25 DIAGNOSIS — Z9181 History of falling: Secondary | ICD-10-CM | POA: Diagnosis not present

## 2016-07-25 DIAGNOSIS — G8929 Other chronic pain: Secondary | ICD-10-CM

## 2016-07-25 DIAGNOSIS — R0781 Pleurodynia: Secondary | ICD-10-CM

## 2016-07-25 DIAGNOSIS — K5900 Constipation, unspecified: Secondary | ICD-10-CM

## 2016-07-25 DIAGNOSIS — E119 Type 2 diabetes mellitus without complications: Secondary | ICD-10-CM | POA: Diagnosis not present

## 2016-07-25 DIAGNOSIS — M545 Low back pain: Secondary | ICD-10-CM

## 2016-07-25 DIAGNOSIS — C9002 Multiple myeloma in relapse: Secondary | ICD-10-CM

## 2016-07-25 LAB — CBC WITH DIFFERENTIAL (CANCER CENTER ONLY)
BASO#: 0 10*3/uL (ref 0.0–0.2)
BASO%: 0.3 % (ref 0.0–2.0)
EOS%: 2.1 % (ref 0.0–7.0)
Eosinophils Absolute: 0.1 10*3/uL (ref 0.0–0.5)
HEMATOCRIT: 40.9 % (ref 38.7–49.9)
HEMOGLOBIN: 13.8 g/dL (ref 13.0–17.1)
LYMPH#: 1.9 10*3/uL (ref 0.9–3.3)
LYMPH%: 28.3 % (ref 14.0–48.0)
MCH: 31.4 pg (ref 28.0–33.4)
MCHC: 33.7 g/dL (ref 32.0–35.9)
MCV: 93 fL (ref 82–98)
MONO#: 0.6 10*3/uL (ref 0.1–0.9)
MONO%: 8.6 % (ref 0.0–13.0)
NEUT%: 60.7 % (ref 40.0–80.0)
NEUTROS ABS: 4.1 10*3/uL (ref 1.5–6.5)
Platelets: 206 10*3/uL (ref 145–400)
RBC: 4.39 10*6/uL (ref 4.20–5.70)
RDW: 12.9 % (ref 11.1–15.7)
WBC: 6.8 10*3/uL (ref 4.0–10.0)

## 2016-07-25 LAB — COMPREHENSIVE METABOLIC PANEL
ALT: 13 U/L (ref 0–55)
AST: 15 U/L (ref 5–34)
Albumin: 4.1 g/dL (ref 3.5–5.0)
Alkaline Phosphatase: 57 U/L (ref 40–150)
Anion Gap: 9 mEq/L (ref 3–11)
BUN: 9 mg/dL (ref 7.0–26.0)
CALCIUM: 9.6 mg/dL (ref 8.4–10.4)
CHLORIDE: 103 meq/L (ref 98–109)
CO2: 28 meq/L (ref 22–29)
CREATININE: 0.8 mg/dL (ref 0.7–1.3)
EGFR: >90 mL/min/{1.73_m2} (ref >90)
GLUCOSE: 143 mg/dL — AB (ref 70–140)
Potassium: 4 mEq/L (ref 3.5–5.1)
SODIUM: 139 meq/L (ref 136–145)
Total Bilirubin: 0.55 mg/dL (ref 0.20–1.20)
Total Protein: 7.8 g/dL (ref 6.4–8.3)

## 2016-07-25 MED ORDER — SODIUM CHLORIDE 0.9% FLUSH
10.0000 mL | INTRAVENOUS | Status: DC | PRN
  Administered 2016-07-25: 10 mL via INTRAVENOUS
  Filled 2016-07-25: qty 10

## 2016-07-25 MED ORDER — HEPARIN SOD (PORK) LOCK FLUSH 100 UNIT/ML IV SOLN
500.0000 [IU] | Freq: Once | INTRAVENOUS | Status: AC
  Administered 2016-07-25: 500 [IU] via INTRAVENOUS
  Filled 2016-07-25: qty 5

## 2016-07-25 NOTE — Patient Instructions (Signed)
Implanted Port Insertion, Care After Refer to this sheet in the next few weeks. These instructions provide you with information on caring for yourself after your procedure. Your health care provider may also give you more specific instructions. Your treatment has been planned according to current medical practices, but problems sometimes occur. Call your health care provider if you have any problems or questions after your procedure. WHAT TO EXPECT AFTER THE PROCEDURE After your procedure, it is typical to have the following:   Discomfort at the port insertion site. Ice packs to the area will help.  Bruising on the skin over the port. This will subside in 3-4 days. HOME CARE INSTRUCTIONS  After your port is placed, you will get a manufacturer's information card. The card has information about your port. Keep this card with you at all times.   Know what kind of port you have. There are many types of ports available.   Wear a medical alert bracelet in case of an emergency. This can help alert health care workers that you have a port.   The port can stay in for as long as your health care provider believes it is necessary.   A home health care nurse may give medicines and take care of the port.   You or a family member can get special training and directions for giving medicine and taking care of the port at home.  SEEK MEDICAL CARE IF:   Your port does not flush or you are unable to get a blood return.   You have a fever or chills. SEEK IMMEDIATE MEDICAL CARE IF:  You have new fluid or pus coming from your incision.   You notice a bad smell coming from your incision site.   You have swelling, pain, or more redness at the incision or port site.   You have chest pain or shortness of breath. This information is not intended to replace advice given to you by your health care provider. Make sure you discuss any questions you have with your health care provider. Document  Released: 03/10/2013 Document Revised: 05/25/2013 Document Reviewed: 03/10/2013 Elsevier Interactive Patient Education  2017 Elsevier Inc.  

## 2016-07-25 NOTE — Progress Notes (Signed)
Hematology and Oncology Follow Up Visit  [name] [name] ZN:[id number] 1956-05-30 61 y.o. 07/25/2016   Principle Diagnosis:  Smoldering IgG Kappa Myeloma   Current Therapy:   Observation    Interim History:  [name] [name] is here today for follow-up. His main complaint is that he fell a week or so ago. This happened in the bathroom. He said he fell asleep all going to the bathroom. He fell and hit the right side of his ribs. They're quite sore. He is on fentanyl and Percocet for chronic pain issues. He is not taking anything else for this. I think it would not be a bad idea to get some x-rays to see if there are any fractures. If there are, there is really not much that we can do about it.  As far as the smoldering myeloma is concerned, his last M spike was 0.9 g/dL. His IgG level was 1351 mg/dL. His Kappa Lightchain was 6.4 mg/dL. All these are holding steady.  He does have diabetes. He has neuropathy. He's had chronic back issues from past surgery.  He's had no fever. He's had no bleeding.  He is constipated. I told him to take the MiraLAX twice a day. Overall, his performance status is ECOG 1-2.   Medications:  Allergies as of 07/25/2016      Reactions   Aripiprazole    Other reaction(s): leg aching   Codeine    REACTION: hives   Duloxetine    Other reaction(s): ineffective   Fluoxetine    Other reaction(s): ineffective      Medication List       Accurate as of 07/25/16  8:36 AM. Always use your most recent med list.          ALPRAZolam 0.5 MG tablet Commonly known as:  XANAX Take 0.5 mg by mouth as directed.   aspirin 81 MG tablet Take 1 tablet (81 mg total) by mouth daily.   clopidogrel 75 MG tablet Commonly known as:  PLAVIX Take 75 mg by mouth daily.   escitalopram 20 MG tablet Commonly known as:  LEXAPRO Take 20 mg by mouth daily.   famciclovir 500 MG tablet Commonly known as:  FAMVIR Take 2 tablets 3 times a day for 7 days, then take 1 tablet a day.     fentaNYL 50 MCG/HR Commonly known as:  DURAGESIC - dosed mcg/hr Place 1 patch onto the skin as directed.   FLOMAX 0.4 MG Caps capsule Generic drug:  tamsulosin Take 0.4 mg by mouth daily.   gabapentin 300 MG capsule Commonly known as:  NEURONTIN TAKE 3 TABS BY MOUTH IN THE  AM & 4 TABS BY MOUTH IN THE PM   lidocaine-prilocaine cream Commonly known as:  EMLA Apply to port a cath site 1 hour prior to coming to office.   metFORMIN 500 MG tablet Commonly known as:  GLUCOPHAGE Take 1,000 mg by mouth 2 (two) times daily.   nitroGLYCERIN 0.4 MG SL tablet Commonly known as:  NITROSTAT Place 1 tablet (0.4 mg total) under the tongue every 5 (five) minutes as needed for chest pain.   oxyCODONE-acetaminophen 5-325 MG tablet Commonly known as:  PERCOCET/ROXICET Take 1 tablet by mouth every 4 (four) hours as needed for moderate pain or severe pain.   pantoprazole 40 MG tablet Commonly known as:  PROTONIX Take 1 tablet (40 mg total) by mouth daily.   simvastatin 20 MG tablet Commonly known as:  ZOCOR Take 20 mg by mouth daily.  sitaGLIPtin 100 MG tablet Commonly known as:  JANUVIA Take 100 mg by mouth daily.   zolpidem 10 MG tablet Commonly known as:  AMBIEN Take 10 mg by mouth at bedtime.       Allergies:  Allergies  Allergen Reactions  . Aripiprazole     Other reaction(s): leg aching  . Codeine     REACTION: hives  . Duloxetine     Other reaction(s): ineffective  . Fluoxetine     Other reaction(s): ineffective    Past Medical History, Surgical history, Social history, and Family History were reviewed and updated.  Review of Systems: All other 10 point review of systems is negative.   Physical Exam:  vitals were not taken for this visit.  Wt Readings from Last 3 Encounters:  04/01/16 153 lb (69.4 kg)  03/21/16 150 lb (68 kg)  01/01/16 151 lb 0.6 oz (68.5 kg)    Thin white male. Head and neck exam shows no ocular or oral lesions. Has no palpable cervical  or supraclavicular lymph nodes. Lungs are clear. Cardiac exam regular rate and rhythm with no murmurs, rubs or bruits. Abdomen is soft. There is no fluid wave. There is good bowel sounds. He has no palpable liver or spleen tip. Back exam does show tenderness over the right lateral rib cage. There may be some slight swelling in the middle ribs. There is some tenderness in this area. Extremities shows no clubbing, cyanosis or edema. Neurological exam shows no focal neurological deficits. Skin exam shows no rashes, ecchymoses or petechia.  Lab Results  Component Value Date   WBC 6.8 07/25/2016   HGB 13.8 07/25/2016   HCT 40.9 07/25/2016   MCV 93 07/25/2016   PLT 206 07/25/2016   Lab Results  Component Value Date   FERRITIN 73.9 11/20/2009   IRON 76 11/20/2009   IRONPCTSAT 22.8 11/20/2009   Lab Results  Component Value Date   RBC 4.39 07/25/2016   Lab Results  Component Value Date   KAPLAMBRATIO 7.85 (H) 01/01/2016   Lab Results  Component Value Date   IGGSERUM 1,351 03/21/2016   IGMSERUM 21 03/21/2016   No results found for: Kathrynn Ducking, MSPIKE, SPEI   Chemistry      Component Value Date/Time   NA 140 03/21/2016 1104   K 4.4 03/21/2016 1104   CL 100 01/01/2016 1526   CO2 24 03/21/2016 1104   BUN 13.7 03/21/2016 1104   CREATININE 0.8 03/21/2016 1104      Component Value Date/Time   CALCIUM 9.6 03/21/2016 1104   ALKPHOS 57 03/21/2016 1104   AST 17 03/21/2016 1104   ALT 7 03/21/2016 1104   BILITOT 0.43 03/21/2016 1104     Impression and Plan: [name] [name] is a 61 yo white male with an IgG Kappa smoldering myeloma diagnosed 6 years ago. He was treated initially with Velcade and Decadron and then Revlimid. So far, he has done well. His last bone marrow test in March showed only 7-10% plasma cells.   We will go ahead and get the x-rays. It would not surprise me had some fractures.  As far as the smoldering myelomas is concerned,  everything looks pretty stable.  We will flush his Port-A-Cath today. We will make sure he gets flushed every 2 months.  We'll see him back in 4 months.   [name], MD 2/22/20188:36 AM

## 2016-07-26 ENCOUNTER — Telehealth: Payer: Self-pay | Admitting: *Deleted

## 2016-07-26 NOTE — Telephone Encounter (Addendum)
Patient aware of results  ----- Message from [name], MD sent at 07/25/2016  5:46 PM EST ----- Call - he does have a bad rib fracture with the right 8th rib.  The is a smaller fracture with the right 10th rib.  Thankfully, there is no lung puncture from this!!  [name] [name]

## 2016-07-30 LAB — MULTIPLE MYELOMA PANEL, SERUM
ALBUMIN SERPL ELPH-MCNC: 3.8 g/dL (ref 2.9–4.4)
ALPHA 1: 0.3 g/dL (ref 0.0–0.4)
Albumin/Glob SerPl: 1.2 (ref 0.7–1.7)
Alpha2 Glob SerPl Elph-Mcnc: 0.8 g/dL (ref 0.4–1.0)
B-Globulin SerPl Elph-Mcnc: 1 g/dL (ref 0.7–1.3)
GAMMA GLOB SERPL ELPH-MCNC: 1.3 g/dL (ref 0.4–1.8)
GLOBULIN, TOTAL: 3.4 g/dL (ref 2.2–3.9)
IGA/IMMUNOGLOBULIN A, SERUM: 101 mg/dL (ref 90–386)
IgG, Qn, Serum: 1310 mg/dL (ref 700–1600)
IgM, Qn, Serum: 19 mg/dL — ABNORMAL LOW (ref 20–172)
M PROTEIN SERPL ELPH-MCNC: 0.9 g/dL — AB
TOTAL PROTEIN: 7.2 g/dL (ref 6.0–8.5)

## 2016-07-31 ENCOUNTER — Telehealth: Payer: Self-pay | Admitting: *Deleted

## 2016-07-31 NOTE — Telephone Encounter (Addendum)
Patient aware of results   ----- Message from [name], MD sent at 07/31/2016 10:19 AM EST ----- Call - myeloma levels are rock stable!!  The protein is 0.9 mg/dL  This is low!!!  Great job!!  pete

## 2016-09-26 ENCOUNTER — Ambulatory Visit (HOSPITAL_BASED_OUTPATIENT_CLINIC_OR_DEPARTMENT_OTHER): Payer: Medicare Other

## 2016-09-26 DIAGNOSIS — Z452 Encounter for adjustment and management of vascular access device: Secondary | ICD-10-CM | POA: Diagnosis not present

## 2016-09-26 DIAGNOSIS — C9000 Multiple myeloma not having achieved remission: Secondary | ICD-10-CM | POA: Diagnosis not present

## 2016-09-26 DIAGNOSIS — Z95828 Presence of other vascular implants and grafts: Secondary | ICD-10-CM

## 2016-09-26 MED ORDER — HEPARIN SOD (PORK) LOCK FLUSH 100 UNIT/ML IV SOLN
500.0000 [IU] | Freq: Once | INTRAVENOUS | Status: AC
  Administered 2016-09-26: 500 [IU] via INTRAVENOUS
  Filled 2016-09-26: qty 5

## 2016-09-26 MED ORDER — SODIUM CHLORIDE 0.9% FLUSH
10.0000 mL | INTRAVENOUS | Status: DC | PRN
  Administered 2016-09-26: 10 mL via INTRAVENOUS
  Filled 2016-09-26: qty 10

## 2016-09-26 NOTE — Patient Instructions (Signed)
Implanted Port Home Guide An implanted port is a type of central line that is placed under the skin. Central lines are used to provide IV access when treatment or nutrition needs to be given through a person's veins. Implanted ports are used for long-term IV access. An implanted port may be placed because:  You need IV medicine that would be irritating to the small veins in your hands or arms.  You need long-term IV medicines, such as antibiotics.  You need IV nutrition for a long period.  You need frequent blood draws for lab tests.  You need dialysis.  Implanted ports are usually placed in the chest area, but they can also be placed in the upper arm, the abdomen, or the leg. An implanted port has two main parts:  Reservoir. The reservoir is round and will appear as a small, raised area under your skin. The reservoir is the part where a needle is inserted to give medicines or draw blood.  Catheter. The catheter is a thin, flexible tube that extends from the reservoir. The catheter is placed into a large vein. Medicine that is inserted into the reservoir goes into the catheter and then into the vein.  How will I care for my incision site? Do not get the incision site wet. Bathe or shower as directed by your health care provider. How is my port accessed? Special steps must be taken to access the port:  Before the port is accessed, a numbing cream can be placed on the skin. This helps numb the skin over the port site.  Your health care provider uses a sterile technique to access the port. ? Your health care provider must put on a mask and sterile gloves. ? The skin over your port is cleaned carefully with an antiseptic and allowed to dry. ? The port is gently pinched between sterile gloves, and a needle is inserted into the port.  Only "non-coring" port needles should be used to access the port. Once the port is accessed, a blood return should be checked. This helps ensure that the port  is in the vein and is not clogged.  If your port needs to remain accessed for a constant infusion, a clear (transparent) bandage will be placed over the needle site. The bandage and needle will need to be changed every week, or as directed by your health care provider.  Keep the bandage covering the needle clean and dry. Do not get it wet. Follow your health care provider's instructions on how to take a shower or bath while the port is accessed.  If your port does not need to stay accessed, no bandage is needed over the port.  What is flushing? Flushing helps keep the port from getting clogged. Follow your health care provider's instructions on how and when to flush the port. Ports are usually flushed with saline solution or a medicine called heparin. The need for flushing will depend on how the port is used.  If the port is used for intermittent medicines or blood draws, the port will need to be flushed: ? After medicines have been given. ? After blood has been drawn. ? As part of routine maintenance.  If a constant infusion is running, the port may not need to be flushed.  How long will my port stay implanted? The port can stay in for as long as your health care provider thinks it is needed. When it is time for the port to come out, surgery will be   done to remove it. The procedure is similar to the one performed when the port was put in. When should I seek immediate medical care? When you have an implanted port, you should seek immediate medical care if:  You notice a bad smell coming from the incision site.  You have swelling, redness, or drainage at the incision site.  You have more swelling or pain at the port site or the surrounding area.  You have a fever that is not controlled with medicine.  This information is not intended to replace advice given to you by your health care provider. Make sure you discuss any questions you have with your health care provider. Document  Released: 05/20/2005 Document Revised: 10/26/2015 Document Reviewed: 01/25/2013 Elsevier Interactive Patient Education  2017 [employer] Inc.  

## 2016-11-28 ENCOUNTER — Ambulatory Visit (HOSPITAL_BASED_OUTPATIENT_CLINIC_OR_DEPARTMENT_OTHER): Payer: Medicare Other | Admitting: Hematology & Oncology

## 2016-11-28 ENCOUNTER — Ambulatory Visit: Payer: Medicare Other

## 2016-11-28 ENCOUNTER — Other Ambulatory Visit (HOSPITAL_BASED_OUTPATIENT_CLINIC_OR_DEPARTMENT_OTHER): Payer: Medicare Other

## 2016-11-28 VITALS — BP 128/86 | HR 73 | Temp 98.3°F | Resp 16 | Wt 152.0 lb

## 2016-11-28 DIAGNOSIS — D472 Monoclonal gammopathy: Secondary | ICD-10-CM

## 2016-11-28 DIAGNOSIS — B0229 Other postherpetic nervous system involvement: Secondary | ICD-10-CM

## 2016-11-28 DIAGNOSIS — C9000 Multiple myeloma not having achieved remission: Secondary | ICD-10-CM | POA: Diagnosis not present

## 2016-11-28 DIAGNOSIS — Z95828 Presence of other vascular implants and grafts: Secondary | ICD-10-CM

## 2016-11-28 LAB — CMP (CANCER CENTER ONLY)
ALK PHOS: 75 U/L (ref 26–84)
ALT: 22 U/L (ref 10–47)
AST: 21 U/L (ref 11–38)
Albumin: 3.7 g/dL (ref 3.3–5.5)
BUN, Bld: 8 mg/dL (ref 7–22)
CALCIUM: 9.4 mg/dL (ref 8.0–10.3)
CO2: 28 mEq/L (ref 18–33)
Chloride: 101 mEq/L (ref 98–108)
Creat: 0.7 mg/dl (ref 0.6–1.2)
Glucose, Bld: 123 mg/dL — ABNORMAL HIGH (ref 73–118)
POTASSIUM: 3.5 meq/L (ref 3.3–4.7)
Sodium: 137 mEq/L (ref 128–145)
TOTAL PROTEIN: 7.6 g/dL (ref 6.4–8.1)
Total Bilirubin: 0.6 mg/dl (ref 0.20–1.60)

## 2016-11-28 LAB — CBC WITH DIFFERENTIAL (CANCER CENTER ONLY)
BASO#: 0 10*3/uL (ref 0.0–0.2)
BASO%: 0.4 % (ref 0.0–2.0)
EOS%: 2.1 % (ref 0.0–7.0)
Eosinophils Absolute: 0.2 10*3/uL (ref 0.0–0.5)
HEMATOCRIT: 39.6 % (ref 38.7–49.9)
HGB: 13.4 g/dL (ref 13.0–17.1)
LYMPH#: 1.6 10*3/uL (ref 0.9–3.3)
LYMPH%: 22.1 % (ref 14.0–48.0)
MCH: 31.1 pg (ref 28.0–33.4)
MCHC: 33.8 g/dL (ref 32.0–35.9)
MCV: 92 fL (ref 82–98)
MONO#: 0.7 10*3/uL (ref 0.1–0.9)
MONO%: 9.2 % (ref 0.0–13.0)
NEUT#: 4.7 10*3/uL (ref 1.5–6.5)
NEUT%: 66.2 % (ref 40.0–80.0)
Platelets: 227 10*3/uL (ref 145–400)
RBC: 4.31 10*6/uL (ref 4.20–5.70)
RDW: 12.4 % (ref 11.1–15.7)
WBC: 7.1 10*3/uL (ref 4.0–10.0)

## 2016-11-28 MED ORDER — HEPARIN SOD (PORK) LOCK FLUSH 100 UNIT/ML IV SOLN
500.0000 [IU] | Freq: Once | INTRAVENOUS | Status: AC
  Administered 2016-11-28: 500 [IU] via INTRAVENOUS
  Filled 2016-11-28: qty 5

## 2016-11-28 MED ORDER — SODIUM CHLORIDE 0.9% FLUSH
10.0000 mL | INTRAVENOUS | Status: DC | PRN
  Administered 2016-11-28: 10 mL via INTRAVENOUS
  Filled 2016-11-28: qty 10

## 2016-11-28 MED ORDER — ALTEPLASE 2 MG IJ SOLR
2.0000 mg | Freq: Once | INTRAMUSCULAR | Status: DC | PRN
  Filled 2016-11-28: qty 2

## 2016-11-28 NOTE — Progress Notes (Signed)
[name] [name] presented for Portacath access and flush. Proper placement of portacath confirmed by CXR using records from [facility]. Portacath located in the left chest wall accessed with  H 20 needle. Clean, Dry and Intact No blood return after 30 ml of normal saline flushes. Attempted to get blood return with multiple different position changes. Pt states he hasn't had blood return from port in over a year. Presented pt with options of removal or TPA to attempt blood return. Pt did not want to stay in clinic for TPA. Discussed with Dr. [name] who stated it was ok to flush and de-access. Dr. [name] stated they would discuss options in MD appointment.  Portacath flushed with 83ml NS and 500U/18ml Heparin per protocol and needle removed intact. Procedure without incident. Patient tolerated procedure well.  Labs drawn without difficulty from left [facility] and sent to lab. Pt tolerated procedure well.

## 2016-11-28 NOTE — Patient Instructions (Signed)
Implanted Port Home Guide An implanted port is a type of central line that is placed under the skin. Central lines are used to provide IV access when treatment or nutrition needs to be given through a person's veins. Implanted ports are used for long-term IV access. An implanted port may be placed because:  You need IV medicine that would be irritating to the small veins in your hands or arms.  You need long-term IV medicines, such as antibiotics.  You need IV nutrition for a long period.  You need frequent blood draws for lab tests.  You need dialysis.  Implanted ports are usually placed in the chest area, but they can also be placed in the upper arm, the abdomen, or the leg. An implanted port has two main parts:  Reservoir. The reservoir is round and will appear as a small, raised area under your skin. The reservoir is the part where a needle is inserted to give medicines or draw blood.  Catheter. The catheter is a thin, flexible tube that extends from the reservoir. The catheter is placed into a large vein. Medicine that is inserted into the reservoir goes into the catheter and then into the vein.  How will I care for my incision site? Do not get the incision site wet. Bathe or shower as directed by your health care provider. How is my port accessed? Special steps must be taken to access the port:  Before the port is accessed, a numbing cream can be placed on the skin. This helps numb the skin over the port site.  Your health care provider uses a sterile technique to access the port. ? Your health care provider must put on a mask and sterile gloves. ? The skin over your port is cleaned carefully with an antiseptic and allowed to dry. ? The port is gently pinched between sterile gloves, and a needle is inserted into the port.  Only "non-coring" port needles should be used to access the port. Once the port is accessed, a blood return should be checked. This helps ensure that the port  is in the vein and is not clogged.  If your port needs to remain accessed for a constant infusion, a clear (transparent) bandage will be placed over the needle site. The bandage and needle will need to be changed every week, or as directed by your health care provider.  Keep the bandage covering the needle clean and dry. Do not get it wet. Follow your health care provider's instructions on how to take a shower or bath while the port is accessed.  If your port does not need to stay accessed, no bandage is needed over the port.  What is flushing? Flushing helps keep the port from getting clogged. Follow your health care provider's instructions on how and when to flush the port. Ports are usually flushed with saline solution or a medicine called heparin. The need for flushing will depend on how the port is used.  If the port is used for intermittent medicines or blood draws, the port will need to be flushed: ? After medicines have been given. ? After blood has been drawn. ? As part of routine maintenance.  If a constant infusion is running, the port may not need to be flushed.  How long will my port stay implanted? The port can stay in for as long as your health care provider thinks it is needed. When it is time for the port to come out, surgery will be   done to remove it. The procedure is similar to the one performed when the port was put in. When should I seek immediate medical care? When you have an implanted port, you should seek immediate medical care if:  You notice a bad smell coming from the incision site.  You have swelling, redness, or drainage at the incision site.  You have more swelling or pain at the port site or the surrounding area.  You have a fever that is not controlled with medicine.  This information is not intended to replace advice given to you by your health care provider. Make sure you discuss any questions you have with your health care provider. Document  Released: 05/20/2005 Document Revised: 10/26/2015 Document Reviewed: 01/25/2013 Elsevier Interactive Patient Education  2017 [employer] Inc.  

## 2016-11-28 NOTE — Progress Notes (Signed)
Hematology and Oncology Follow Up Visit  [name] [name] [id number] 1956/02/05 61 y.o. 11/28/2016   Principle Diagnosis:  Smoldering IgG Kappa Myeloma   Current Therapy:   Observation    Interim History:  [name] [name] is here today for follow-up. He is bothered by neuropathy. He is bothered by postherpetic neuralgia. This is on the back of his neck. It's an area in which we really cannot use a Lidoderm patch.  His neuropathy is probably a combination of his back surgery and diabetes.  His last monoclonal studies done in February showed a M spike of 0.9 g/dL. His IgG level was 131 0 mg/dL. His Kappa Lightchain was not done.  Only last saw him he had fallen. He did fracture 3 ribs on his right side.  He's had no problems with bowels or bladder. He may be a little bit constipated.  He's had no headache. He's had no fever. There's been no issues with infections.   Overall, his performance status is ECOG 1.   Medications:  Allergies as of 11/28/2016      Reactions   Aripiprazole    Other reaction(s): leg aching   Codeine    REACTION: hives   Duloxetine    Other reaction(s): ineffective   Fluoxetine    Other reaction(s): ineffective      Medication List       Accurate as of 11/28/16 10:10 AM. Always use your most recent med list.          ALPRAZolam 0.5 MG tablet Commonly known as:  XANAX Take 0.5 mg by mouth as directed.   aspirin 81 MG tablet Take 1 tablet (81 mg total) by mouth daily.   clopidogrel 75 MG tablet Commonly known as:  PLAVIX Take 75 mg by mouth daily.   escitalopram 20 MG tablet Commonly known as:  LEXAPRO Take 20 mg by mouth daily.   fentaNYL 50 MCG/HR Commonly known as:  DURAGESIC - dosed mcg/hr Place 1 patch onto the skin as directed.   FLOMAX 0.4 MG Caps capsule Generic drug:  tamsulosin Take 0.4 mg by mouth daily.   gabapentin 300 MG capsule Commonly known as:  NEURONTIN TAKE 3 TABS BY MOUTH IN THE  AM & 4 TABS BY MOUTH IN THE PM     lidocaine-prilocaine cream Commonly known as:  EMLA Apply to port a cath site 1 hour prior to coming to office.   metFORMIN 500 MG tablet Commonly known as:  GLUCOPHAGE Take 1,000 mg by mouth 2 (two) times daily.   nitroGLYCERIN 0.4 MG SL tablet Commonly known as:  NITROSTAT Place 1 tablet (0.4 mg total) under the tongue every 5 (five) minutes as needed for chest pain.   oxyCODONE-acetaminophen 5-325 MG tablet Commonly known as:  PERCOCET/ROXICET Take 1 tablet by mouth every 4 (four) hours as needed for moderate pain or severe pain.   pantoprazole 40 MG tablet Commonly known as:  PROTONIX Take 1 tablet (40 mg total) by mouth daily.   predniSONE 10 MG tablet Commonly known as:  DELTASONE Take 10 mg by mouth daily with breakfast. Follow the instructions.   simvastatin 20 MG tablet Commonly known as:  ZOCOR Take 20 mg by mouth daily.   sitaGLIPtin 100 MG tablet Commonly known as:  JANUVIA Take 100 mg by mouth daily.   zolpidem 10 MG tablet Commonly known as:  AMBIEN Take 10 mg by mouth at bedtime.       Allergies:  Allergies  Allergen Reactions  . Aripiprazole  Other reaction(s): leg aching  . Codeine     REACTION: hives  . Duloxetine     Other reaction(s): ineffective  . Fluoxetine     Other reaction(s): ineffective    Past Medical History, Surgical history, Social history, and Family History were reviewed and updated.  Review of Systems: All other 10 point review of systems is negative.   Physical Exam:  weight is 152 lb (68.9 kg). His oral temperature is 98.3 F (36.8 C). His blood pressure is 128/86 and his pulse is 73. His respiration is 16 and oxygen saturation is 100%.   Wt Readings from Last 3 Encounters:  11/28/16 152 lb (68.9 kg)  04/01/16 153 lb (69.4 kg)  03/21/16 150 lb (68 kg)    Thin white male. Head and neck exam shows no ocular or oral lesions. Has no palpable cervical or supraclavicular lymph nodes. Lungs are clear. Cardiac exam  regular rate and rhythm with no murmurs, rubs or bruits. Abdomen is soft. There is no fluid wave. There is good bowel sounds. He has no palpable liver or spleen tip. Back exam does show tenderness over the right lateral rib cage. There may be some slight swelling in the middle ribs. There is some tenderness in this area. Extremities shows no clubbing, cyanosis or edema. Neurological exam shows no focal neurological deficits. Skin exam shows no rashes, ecchymoses or petechia.  Lab Results  Component Value Date   WBC 7.1 11/28/2016   HGB 13.4 11/28/2016   HCT 39.6 11/28/2016   MCV 92 11/28/2016   PLT 227 11/28/2016   Lab Results  Component Value Date   FERRITIN 73.9 11/20/2009   IRON 76 11/20/2009   IRONPCTSAT 22.8 11/20/2009   Lab Results  Component Value Date   RBC 4.31 11/28/2016   Lab Results  Component Value Date   KAPLAMBRATIO 7.85 (H) 01/01/2016   Lab Results  Component Value Date   IGGSERUM 1,310 07/25/2016   IGMSERUM 19 (L) 07/25/2016   No results found for: Odetta Pink, SPEI   Chemistry      Component Value Date/Time   NA 137 11/28/2016 0855   NA 139 07/25/2016 0815   K 3.5 11/28/2016 0855   K 4.0 07/25/2016 0815   CL 101 11/28/2016 0855   CO2 28 11/28/2016 0855   CO2 28 07/25/2016 0815   BUN 8 11/28/2016 0855   BUN 9.0 07/25/2016 0815   CREATININE 0.7 11/28/2016 0855   CREATININE 0.8 07/25/2016 0815      Component Value Date/Time   CALCIUM 9.4 11/28/2016 0855   CALCIUM 9.6 07/25/2016 0815   ALKPHOS 75 11/28/2016 0855   ALKPHOS 57 07/25/2016 0815   AST 21 11/28/2016 0855   AST 15 07/25/2016 0815   ALT 22 11/28/2016 0855   ALT 13 07/25/2016 0815   BILITOT 0.60 11/28/2016 0855   BILITOT 0.55 07/25/2016 0815     Impression and Plan: [name] [name] is a 61 yo white male with an IgG Kappa smoldering myeloma diagnosed 6 years ago. He was treated initially with Velcade and Decadron and then Revlimid. So far,  he has done well. His last bone marrow test in March showed only 7-10% plasma cells.   I feel bad that he still has his post herpetic neuralgia. He is done quite a bit to try to help with this.  He does not need his Port-A-Cath from my point of view. I will go ahead and get this removed. We  will see if radiology will remove this.  I think we can get him back in 6 months. Week and get him back sooner if any problems arise.   As always, we had good fellowship.    6/28/201810:10 AM

## 2016-11-29 LAB — KAPPA/LAMBDA LIGHT CHAINS
Ig Kappa Free Light Chain: 72.9 mg/L — ABNORMAL HIGH (ref 3.3–19.4)
Ig Lambda Free Light Chain: 7 mg/L (ref 5.7–26.3)
Kappa/Lambda FluidC Ratio: 10.41 — ABNORMAL HIGH (ref 0.26–1.65)

## 2016-11-29 LAB — IGG, IGA, IGM
IgA, Qn, Serum: 110 mg/dL (ref 61–437)
IgG, Qn, Serum: 1407 mg/dL (ref 700–1600)
IgM, Qn, Serum: 21 mg/dL (ref 20–172)

## 2016-12-02 LAB — PROTEIN ELECTROPHORESIS, SERUM, WITH REFLEX
A/G Ratio: 1.1 (ref 0.7–1.7)
ALPHA 1: 0.3 g/dL (ref 0.0–0.4)
Albumin: 3.8 g/dL (ref 2.9–4.4)
Alpha 2: 0.9 g/dL (ref 0.4–1.0)
Beta: 1 g/dL (ref 0.7–1.3)
GAMMA GLOBULIN: 1.4 g/dL (ref 0.4–1.8)
Globulin, Total: 3.6 g/dL (ref 2.2–3.9)
INTERPRETATION(SEE BELOW): 0
M-SPIKE, %: 1.2 g/dL — AB
Total Protein: 7.4 g/dL (ref 6.0–8.5)

## 2016-12-10 ENCOUNTER — Other Ambulatory Visit: Payer: Self-pay | Admitting: Physician Assistant

## 2016-12-10 ENCOUNTER — Telehealth: Payer: Self-pay | Admitting: *Deleted

## 2016-12-10 ENCOUNTER — Other Ambulatory Visit: Payer: Self-pay | Admitting: Radiology

## 2016-12-10 NOTE — Telephone Encounter (Addendum)
Patient aware of results.   ----- Message from [name], MD sent at 12/10/2016  2:03 PM EDT ----- Call - the myeloma protein is up a little bit!!  The next visit in December will be very important as to whether or not we need to do treatment!!  Stay well hydrated!!  pete

## 2016-12-11 ENCOUNTER — Encounter (HOSPITAL_COMMUNITY): Payer: Self-pay

## 2016-12-11 ENCOUNTER — Other Ambulatory Visit: Payer: Self-pay | Admitting: Hematology & Oncology

## 2016-12-11 ENCOUNTER — Ambulatory Visit (HOSPITAL_COMMUNITY)
Admission: RE | Admit: 2016-12-11 | Discharge: 2016-12-11 | Disposition: A | Discharge status: Home / Self Care | Payer: Medicare Other | Source: Ambulatory Visit | Attending: Hematology & Oncology | Admitting: Hematology & Oncology

## 2016-12-11 DIAGNOSIS — G4733 Obstructive sleep apnea (adult) (pediatric): Secondary | ICD-10-CM | POA: Diagnosis not present

## 2016-12-11 DIAGNOSIS — Z7952 Long term (current) use of systemic steroids: Secondary | ICD-10-CM | POA: Insufficient documentation

## 2016-12-11 DIAGNOSIS — F329 Major depressive disorder, single episode, unspecified: Secondary | ICD-10-CM | POA: Insufficient documentation

## 2016-12-11 DIAGNOSIS — N400 Enlarged prostate without lower urinary tract symptoms: Secondary | ICD-10-CM | POA: Insufficient documentation

## 2016-12-11 DIAGNOSIS — E1142 Type 2 diabetes mellitus with diabetic polyneuropathy: Secondary | ICD-10-CM | POA: Insufficient documentation

## 2016-12-11 DIAGNOSIS — C9000 Multiple myeloma not having achieved remission: Secondary | ICD-10-CM

## 2016-12-11 DIAGNOSIS — M549 Dorsalgia, unspecified: Secondary | ICD-10-CM | POA: Insufficient documentation

## 2016-12-11 DIAGNOSIS — Z955 Presence of coronary angioplasty implant and graft: Secondary | ICD-10-CM | POA: Diagnosis not present

## 2016-12-11 DIAGNOSIS — G8929 Other chronic pain: Secondary | ICD-10-CM | POA: Diagnosis not present

## 2016-12-11 DIAGNOSIS — Z7984 Long term (current) use of oral hypoglycemic drugs: Secondary | ICD-10-CM | POA: Diagnosis not present

## 2016-12-11 DIAGNOSIS — Z95828 Presence of other vascular implants and grafts: Secondary | ICD-10-CM

## 2016-12-11 DIAGNOSIS — K219 Gastro-esophageal reflux disease without esophagitis: Secondary | ICD-10-CM | POA: Diagnosis not present

## 2016-12-11 DIAGNOSIS — D472 Monoclonal gammopathy: Secondary | ICD-10-CM

## 2016-12-11 DIAGNOSIS — G4700 Insomnia, unspecified: Secondary | ICD-10-CM | POA: Diagnosis not present

## 2016-12-11 DIAGNOSIS — G2581 Restless legs syndrome: Secondary | ICD-10-CM | POA: Insufficient documentation

## 2016-12-11 DIAGNOSIS — Z87891 Personal history of nicotine dependence: Secondary | ICD-10-CM | POA: Insufficient documentation

## 2016-12-11 DIAGNOSIS — E785 Hyperlipidemia, unspecified: Secondary | ICD-10-CM | POA: Insufficient documentation

## 2016-12-11 DIAGNOSIS — Z7902 Long term (current) use of antithrombotics/antiplatelets: Secondary | ICD-10-CM | POA: Insufficient documentation

## 2016-12-11 DIAGNOSIS — Z7982 Long term (current) use of aspirin: Secondary | ICD-10-CM | POA: Insufficient documentation

## 2016-12-11 DIAGNOSIS — Z8249 Family history of ischemic heart disease and other diseases of the circulatory system: Secondary | ICD-10-CM | POA: Insufficient documentation

## 2016-12-11 DIAGNOSIS — I2510 Atherosclerotic heart disease of native coronary artery without angina pectoris: Secondary | ICD-10-CM | POA: Diagnosis not present

## 2016-12-11 DIAGNOSIS — Z452 Encounter for adjustment and management of vascular access device: Secondary | ICD-10-CM | POA: Diagnosis present

## 2016-12-11 HISTORY — PX: IR REMOVAL TUN ACCESS W/ PORT W/O FL MOD SED: IMG2290

## 2016-12-11 HISTORY — PX: IR TRANSCATH RETRIEVAL FB INCL GUIDANCE (MS): IMG5375

## 2016-12-11 HISTORY — PX: IR US GUIDE VASC ACCESS RIGHT: IMG2390

## 2016-12-11 LAB — CBC
HEMATOCRIT: 39 % (ref 39.0–52.0)
Hemoglobin: 13.4 g/dL (ref 13.0–17.0)
MCH: 30.7 pg (ref 26.0–34.0)
MCHC: 34.4 g/dL (ref 30.0–36.0)
MCV: 89.4 fL (ref 78.0–100.0)
Platelets: 238 10*3/uL (ref 150–400)
RBC: 4.36 MIL/uL (ref 4.22–5.81)
RDW: 12.8 % (ref 11.5–15.5)
WBC: 8.6 10*3/uL (ref 4.0–10.5)

## 2016-12-11 LAB — GLUCOSE, CAPILLARY
Glucose-Capillary: 100 mg/dL — ABNORMAL HIGH (ref 65–99)
Glucose-Capillary: 95 mg/dL (ref 65–99)

## 2016-12-11 LAB — APTT: APTT: 27 s (ref 24–36)

## 2016-12-11 LAB — PROTIME-INR
INR: 0.89
PROTHROMBIN TIME: 12 s (ref 11.4–15.2)

## 2016-12-11 MED ORDER — FENTANYL CITRATE (PF) 100 MCG/2ML IJ SOLN
INTRAMUSCULAR | Status: DC
Start: 2016-12-11 — End: 2016-12-12
  Filled 2016-12-11: qty 2

## 2016-12-11 MED ORDER — CEFAZOLIN SODIUM-DEXTROSE 2-4 GM/100ML-% IV SOLN
INTRAVENOUS | Status: AC
  Administered 2016-12-11: 2 g via INTRAVENOUS
  Filled 2016-12-11: qty 100

## 2016-12-11 MED ORDER — IOPAMIDOL (ISOVUE-300) INJECTION 61%: 50.0000 mL | Freq: Once | INTRAVENOUS | Status: DC | PRN

## 2016-12-11 MED ORDER — CEFAZOLIN (ANCEF) 1 G IV SOLR: 1.0000 g | INTRAVENOUS | Status: DC

## 2016-12-11 MED ORDER — LIDOCAINE-EPINEPHRINE (PF) 2 %-1:200000 IJ SOLN
INTRAMUSCULAR | Status: AC
  Filled 2016-12-11: qty 20

## 2016-12-11 MED ORDER — LIDOCAINE HCL 1 % IJ SOLN
INTRAMUSCULAR | Status: AC
  Filled 2016-12-11: qty 20

## 2016-12-11 MED ORDER — MIDAZOLAM HCL 2 MG/2ML IJ SOLN
INTRAMUSCULAR | Status: AC | PRN
  Administered 2016-12-11 (×5): 1 mg via INTRAVENOUS

## 2016-12-11 MED ORDER — FENTANYL CITRATE (PF) 100 MCG/2ML IJ SOLN
INTRAMUSCULAR | Status: AC | PRN
  Administered 2016-12-11 (×6): 50 ug via INTRAVENOUS

## 2016-12-11 MED ORDER — MIDAZOLAM HCL 2 MG/2ML IJ SOLN
INTRAMUSCULAR | Status: AC
  Filled 2016-12-11: qty 2

## 2016-12-11 MED ORDER — CEFAZOLIN SODIUM-DEXTROSE 2-4 GM/100ML-% IV SOLN
INTRAVENOUS | Status: AC
  Filled 2016-12-11: qty 100

## 2016-12-11 MED ORDER — CEFAZOLIN SODIUM-DEXTROSE 2-4 GM/100ML-% IV SOLN
2.0000 g | Freq: Once | INTRAVENOUS | Status: AC
  Administered 2016-12-11: 17:00:00 via INTRAVENOUS
  Administered 2016-12-11: 2 g via INTRAVENOUS

## 2016-12-11 MED ORDER — SODIUM CHLORIDE 0.9 % IV SOLN
INTRAVENOUS | Status: DC
  Administered 2016-12-11: 12:00:00 via INTRAVENOUS

## 2016-12-11 MED ORDER — IOPAMIDOL (ISOVUE-300) INJECTION 61%
INTRAVENOUS | Status: AC
  Filled 2016-12-11: qty 50

## 2016-12-11 MED ORDER — FENTANYL CITRATE (PF) 100 MCG/2ML IJ SOLN
INTRAMUSCULAR | Status: AC
  Filled 2016-12-11: qty 2

## 2016-12-11 MED ORDER — MIDAZOLAM HCL 2 MG/2ML IJ SOLN
INTRAMUSCULAR | Status: AC
  Filled 2016-12-11: qty 4

## 2016-12-11 NOTE — Discharge Instructions (Signed)
May resume Plavix tomorrow (7/12)   Implanted Port Removal, Care After Refer to this sheet in the next few weeks. These instructions provide you with information about caring for yourself after your procedure. Your health care provider may also give you more specific instructions. Your treatment has been planned according to current medical practices, but problems sometimes occur. Call your health care provider if you have any problems or questions after your procedure. What can I expect after the procedure? After the procedure, it is common to have:  Soreness or pain near your incision.  Some swelling or bruising near your incision.  Follow these instructions at home: Medicines  Take over-the-counter and prescription medicines only as told by your health care provider.  If you were prescribed an antibiotic medicine, take it as told by your health care provider. Do not stop taking the antibiotic even if you start to feel better. Bathing  Do not take baths, swim, or use a hot tub until your health care provider approves. Ask your health care provider if you can take showers. You may only be allowed to take sponge baths for bathing. Incision care  Follow instructions from your health care provider about how to take care of your incision. Make sure you: ? Wash your hands with soap and water before you change your bandage (dressing). If soap and water are not available, use hand sanitizer. ? Change your dressing as told by your health care provider. ? Keep your dressing dry. ? Leave stitches (sutures), skin glue, or adhesive strips in place. These skin closures may need to stay in place for 2 weeks or longer. If adhesive strip edges start to loosen and curl up, you may trim the loose edges. Do not remove adhesive strips completely unless your health care provider tells you to do that.  Check your incision area every day for signs of infection. Check for: ? More redness, swelling, or  pain. ? More fluid or blood. ? Warmth. ? Pus or a bad smell. Driving  If you received a sedative, do not drive for 24 hours after the procedure.  If you did not receive a sedative, ask your health care provider when it is safe to drive. Activity  Return to your normal activities as told by your health care provider. Ask your health care provider what activities are safe for you.  Until your health care provider says it is safe: ? Do not lift anything that is heavier than 10 lb (4.5 kg). ? Do not do activities that involve lifting your arms over your head. General instructions  Do not use any tobacco products, such as cigarettes, chewing tobacco, and e-cigarettes. Tobacco can delay healing. If you need help quitting, ask your health care provider.  Keep all follow-up visits as told by your health care provider. This is important. Contact a health care provider if:  You have more redness, swelling, or pain around your incision.  You have more fluid or blood coming from your incision.  Your incision feels warm to the touch.  You have pus or a bad smell coming from your incision.  You have a fever.  You have pain that is not relieved by your pain medicine. Get help right away if:  You have chest pain.  You have difficulty breathing. This information is not intended to replace advice given to you by your health care provider. Make sure you discuss any questions you have with your health care provider. Document Released: 05/01/2015 Document Revised:  10/26/2015 Document Reviewed: 02/22/2015 Elsevier Interactive Patient Education  2018 [place].    Femoral Site Care Refer to this sheet in the next few weeks. These instructions provide you with information about caring for yourself after your procedure. Your health care provider may also give you more specific instructions. Your treatment has been planned according to current medical practices, but problems sometimes occur.  Call your health care provider if you have any problems or questions after your procedure. What can I expect after the procedure? After your procedure, it is typical to have the following:  Bruising at the site that usually fades within 1-2 weeks.  Blood collecting in the tissue (hematoma) that may be painful to the touch. It should usually decrease in size and tenderness within 1-2 weeks.  Follow these instructions at home:  Take medicines only as directed by your health care provider.  You may shower 24-48 hours after the procedure or as directed by your health care provider. Remove the bandage (dressing) and gently wash the site with plain soap and water. Pat the area dry with a clean towel. Do not rub the site, because this may cause bleeding.  Do not take baths, swim, or use a hot tub until your health care provider approves.  Check your insertion site every day for redness, swelling, or drainage.  Do not apply powder or lotion to the site.  Limit use of stairs to twice a day for the first 2-3 days or as directed by your health care provider.  Do not squat for the first 2-3 days or as directed by your health care provider.  Do not lift over 10 lb (4.5 kg) for 5 days after your procedure or as directed by your health care provider.  Ask your health care provider when it is okay to: ? Return to work or school. ? Resume usual physical activities or sports. ? Resume sexual activity.  Do not drive home if you are discharged the same day as the procedure. Have someone else drive you.  You may drive 24 hours after the procedure unless otherwise instructed by your health care provider.  Do not operate machinery or power tools for 24 hours after the procedure or as directed by your health care provider.  If your procedure was done as an outpatient procedure, which means that you went home the same day as your procedure, a responsible adult should be with you for the first 24 hours  after you arrive home.  Keep all follow-up visits as directed by your health care provider. This is important. Contact a health care provider if:  You have a fever.  You have chills.  You have increased bleeding from the site. Hold pressure on the site. Get help right away if:  You have unusual pain at the site.  You have redness, warmth, or swelling at the site.  You have drainage (other than a small amount of blood on the dressing) from the site.  The site is bleeding, and the bleeding does not stop after 30 minutes of holding steady pressure on the site.  Your leg or foot becomes pale, cool, tingly, or numb. This information is not intended to replace advice given to you by your health care provider. Make sure you discuss any questions you have with your health care provider. Document Released: 01/21/2014 Document Revised: 10/26/2015 Document Reviewed: 12/07/2013 Elsevier Interactive Patient Education  2018 [place].   Moderate Conscious Sedation, Adult, Care After These instructions provide you  with information about caring for yourself after your procedure. Your health care provider may also give you more specific instructions. Your treatment has been planned according to current medical practices, but problems sometimes occur. Call your health care provider if you have any problems or questions after your procedure. What can I expect after the procedure? After your procedure, it is common:  To feel sleepy for several hours.  To feel clumsy and have poor balance for several hours.  To have poor judgment for several hours.  To vomit if you eat too soon.  Follow these instructions at home: For at least 24 hours after the procedure:   Do not: ? Participate in activities where you could fall or become injured. ? Drive. ? Use heavy machinery. ? Drink alcohol. ? Take sleeping pills or medicines that cause drowsiness. ? Make important decisions or sign legal  documents. ? Take care of children on your own.  Rest. Eating and drinking  Follow the diet recommended by your health care provider.  If you vomit: ? Drink water, juice, or soup when you can drink without vomiting. ? Make sure you have little or no nausea before eating solid foods. General instructions  Have a responsible adult stay with you until you are awake and alert.  Take over-the-counter and prescription medicines only as told by your health care provider.  If you smoke, do not smoke without supervision.  Keep all follow-up visits as told by your health care provider. This is important. Contact a health care provider if:  You keep feeling nauseous or you keep vomiting.  You feel light-headed.  You develop a rash.  You have a fever. Get help right away if:  You have trouble breathing. This information is not intended to replace advice given to you by your health care provider. Make sure you discuss any questions you have with your health care provider. Document Released: 03/10/2013 Document Revised: 10/23/2015 Document Reviewed: 09/09/2015 Elsevier Interactive Patient Education  [employer].

## 2016-12-11 NOTE — Sedation Documentation (Signed)
Pt moved to fluro table without any issues. V/S stable.

## 2016-12-11 NOTE — Progress Notes (Signed)
[name] [name] instructed to resume Plavix tomorrow per Dr. Kathlene Cote. Patient and his wife verbalized understanding.

## 2016-12-11 NOTE — Sedation Documentation (Signed)
Family updated as to patient's status.

## 2016-12-11 NOTE — Consult Note (Signed)
Chief Complaint: Patient was seen in consultation today for port a cath removal.  Referring Physician(s): Ennever,Peter R  Supervising Physician: Aletta Edouard  Patient Status: [facility] - Out-pt  History of Present Illness: [name] [name] is a 61 y.o. male w/ hx of multiple myeloma s/p treatment.  Port a cath placed by outside facility in left chest chest wall.  Here today for port a cath removal.  Past Medical History:  Diagnosis Date  . CAD (coronary artery disease)    stents '99 and '05; patent circ and IM stents cath. [facility]ley Fling 10/08 non ischemic  . Chronic back pain   . Depression   . DM (diabetes mellitus) ([place])   . GERD (gastroesophageal reflux disease)   . HLD (hyperlipidemia)   . Insomnia   . Multiple myeloma    dx 11/11. followed by Dr. [name] in [place]  . OSA (obstructive sleep apnea)    AHI 5 from home sleep test 10/05/09  . Peripheral neuropathy   . Prostatism   . RLS (restless legs syndrome)    responded to iron supplementation    Past Surgical History:  Procedure Laterality Date  . L4-5 epidural steriod injection     with fluoroscopic guidance  . release of rt long finger A1     pulley with debridement of tenosynovitis  . stent placed     95% lesion in AV circumflex that was dilated and stented with an AVE stent. also had 60% lesion in the OM-I and minor disease in LAD and RCA 02/14/98. Dr Velora Heckler cc Dr. Percival Spanish     Allergies: Aripiprazole; Codeine; Duloxetine; and Fluoxetine  Medications: Prior to Admission medications   Medication Sig Start Date End Date Taking? Authorizing Provider  ALPRAZolam Duanne Moron) 0.5 MG tablet Take 0.5 mg by mouth as directed.    Yes [provider]  aspirin 81 MG tablet Take 1 tablet (81 mg total) by mouth daily. 04/03/14  Yes Josue Hector, MD  escitalopram (LEXAPRO) 20 MG tablet Take 20 mg by mouth daily. 05/02/14  Yes [provider]  fentaNYL (DURAGESIC - DOSED MCG/HR) 50 MCG/HR Place 1 patch onto the  skin as directed. 05/11/15  Yes [provider]  gabapentin (NEURONTIN) 300 MG capsule TAKE 3 TABS BY MOUTH IN THE  AM & 4 TABS BY MOUTH IN THE PM   Yes [provider]  metFORMIN (GLUCOPHAGE) 500 MG tablet Take 1,000 mg by mouth 2 (two) times daily.    Yes [provider]  oxyCODONE-acetaminophen (PERCOCET) 5-325 MG per tablet Take 1 tablet by mouth every 4 (four) hours as needed for moderate pain or severe pain.    Yes [provider]  pantoprazole (PROTONIX) 40 MG tablet Take 1 tablet (40 mg total) by mouth daily. 03/01/13  Yes Josue Hector, MD  predniSONE (DELTASONE) 10 MG tablet Take 10 mg by mouth daily with breakfast. Follow the instructions.   Yes [provider]  simvastatin (ZOCOR) 20 MG tablet Take 20 mg by mouth daily. 06/22/15  Yes [provider]  sitaGLIPtin (JANUVIA) 100 MG tablet Take 100 mg by mouth daily.   Yes [provider]  zolpidem (AMBIEN) 10 MG tablet Take 10 mg by mouth at bedtime. 05/15/14  Yes [provider]  clopidogrel (PLAVIX) 75 MG tablet Take 75 mg by mouth daily.    [provider]  lidocaine-prilocaine (EMLA) cream Apply to port a cath site 1 hour prior to coming to office. 01/01/16   Ennever,  Rudell Cobb, MD  nitroGLYCERIN (NITROSTAT) 0.4 MG SL tablet Place 1 tablet (0.4 mg total) under the tongue every 5 (five) minutes as needed for chest pain. 05/31/14   Josue Hector, MD  Tamsulosin HCl (FLOMAX) 0.4 MG CAPS Take 0.4 mg by mouth daily.      [provider]     Family History  Problem Relation Age of Onset  . Heart disease Father   . Diabetes Father   . Hypertension Mother     Social History   Social History  . Marital status: Married    Spouse name: N/A  . Number of children: N/A  . Years of education: N/A   Social History Main Topics  . Smoking status: Former Research scientist (life sciences)  . Smokeless tobacco: Never Used     Comment: no intake of tobacco produtcs; does chew  nicotene gum   . Alcohol use No  . Drug use: Unknown  . Sexual activity: Not Asked   Other Topics Concern  . 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       Review of Systems  Currently denies fever, headache, chest pain, SOB, cough, nausea, vomiting, abd pain.  Vital Signs: BP 104/75 (BP Location: Left Arm)   Pulse (!) 59   Temp 98.5 F (36.9 C) (Oral)   Resp 18   SpO2 100%   Physical Exam  Alert and oriented.  Heart normal rate and rhythm, no MGR noted.  Left chest wall port a cath intact, clean and dry, nontender.  Lungs CTAB, no adventitious sounds.  Normoactive BS in all four quadrants, no TTP.  Mallampati Score:     Imaging: No results found.  Labs:  CBC:  Recent Labs  03/21/16 1104 07/25/16 0815 11/28/16 0855 12/11/16 1142  WBC 6.3 6.8 7.1 8.6  HGB 13.9 13.8 13.4 13.4  HCT 40.8 40.9 39.6 39.0  PLT 239 206 227 238    COAGS:  Recent Labs  12/11/16 1142  INR 0.89  APTT 27    BMP:  Recent Labs  01/01/16 1526 03/21/16 1104 07/25/16 0815 11/28/16 0855  NA 135 140 139 137  K 4.7 4.4 4.0 3.5  CL 100  --   --  101  CO2 _0 GLUCOSE 132* 96 143* 123*  BUN 13 13.7 9.0 8  CALCIUM 9.5 9.6 9.6 9.4  CREATININE 0.79 0.8 0.8 0.7  GFRNONAA 98  --   --   --   GFRAA 113  --   --   --     LIVER FUNCTION TESTS:  Recent Labs  01/01/16 1526 03/21/16 1104 07/25/16 0815 11/28/16 0855  BILITOT 0.2 0.43 0.55 0.60  AST _1 ALT _2 ALKPHOS 60 57 57 75  PROT 7.0  7.8 8.0  7.6 7.8  7.2 7.4  7.6  ALBUMIN 4.5 4.1 4.1 3.7    TUMOR MARKERS: No results for input(s): AFPTM, CEA, CA199, CHROMGRNA in the last 8760 hours.  Assessment and Plan: Multiple myeloma s/p treatment here for port a cath removal.  Port a cath to be removed here today.   Risks and benefits discussed with the patient/wife.  All of the patient's questions were answered, patient is  agreeable to proceed.  Consent signed and in chart.   Thank you for this interesting consult.  I greatly enjoyed meeting [name] [name] and look forward to  participating in their care.  A copy of this report was sent to the requesting provider on this date.  Electronically Signed: D. Rowe Robert, PA-C / Quincy Sheehan, PA-S 12/11/2016, 12:41 PM   I spent a total of  15 Minutes   in face to face in clinical consultation, greater than 50% of which was counseling/coordinating care for port a cath removal.

## 2016-12-11 NOTE — Procedures (Signed)
Interventional Radiology Procedure Note  Procedure: Port removal and retrieval  Complications: None  Estimated Blood Loss: 10-25 mL  Findings:  Port reservoir removed from pocket without difficulty.  Catheter could not be removed due to tethering at the venous insertion site. Catheter retrieved by snare via transvenous route after right common femoral venous puncture.  Able to remove entire port catheter. See full dictated procedure note.  Venetia Night. Kathlene Cote, M.D Pager:  [phone]

## 2016-12-12 ENCOUNTER — Encounter (HOSPITAL_COMMUNITY): Payer: Self-pay | Admitting: Interventional Radiology

## 2017-05-07 ENCOUNTER — Other Ambulatory Visit: Payer: Self-pay

## 2017-05-07 ENCOUNTER — Other Ambulatory Visit: Payer: Medicare Other

## 2017-05-07 ENCOUNTER — Other Ambulatory Visit (HOSPITAL_BASED_OUTPATIENT_CLINIC_OR_DEPARTMENT_OTHER): Payer: Medicare Other

## 2017-05-07 ENCOUNTER — Encounter: Payer: Self-pay | Admitting: Hematology & Oncology

## 2017-05-07 ENCOUNTER — Ambulatory Visit (HOSPITAL_BASED_OUTPATIENT_CLINIC_OR_DEPARTMENT_OTHER): Payer: Medicare Other | Admitting: Hematology & Oncology

## 2017-05-07 VITALS — BP 122/78 | HR 68 | Temp 98.4°F | Resp 20 | Wt 149.4 lb

## 2017-05-07 DIAGNOSIS — D472 Monoclonal gammopathy: Secondary | ICD-10-CM

## 2017-05-07 DIAGNOSIS — C9000 Multiple myeloma not having achieved remission: Secondary | ICD-10-CM

## 2017-05-07 DIAGNOSIS — E119 Type 2 diabetes mellitus without complications: Secondary | ICD-10-CM | POA: Diagnosis not present

## 2017-05-07 DIAGNOSIS — G629 Polyneuropathy, unspecified: Secondary | ICD-10-CM

## 2017-05-07 DIAGNOSIS — Z95828 Presence of other vascular implants and grafts: Secondary | ICD-10-CM

## 2017-05-07 LAB — CBC WITH DIFFERENTIAL (CANCER CENTER ONLY)
BASO#: 0 10*3/uL (ref 0.0–0.2)
BASO%: 0.3 % (ref 0.0–2.0)
EOS%: 1.2 % (ref 0.0–7.0)
Eosinophils Absolute: 0.1 10*3/uL (ref 0.0–0.5)
HCT: 40.2 % (ref 38.7–49.9)
HEMOGLOBIN: 13.6 g/dL (ref 13.0–17.1)
LYMPH#: 1.6 10*3/uL (ref 0.9–3.3)
LYMPH%: 21.1 % (ref 14.0–48.0)
MCH: 31.1 pg (ref 28.0–33.4)
MCHC: 33.8 g/dL (ref 32.0–35.9)
MCV: 92 fL (ref 82–98)
MONO#: 0.6 10*3/uL (ref 0.1–0.9)
MONO%: 7.7 % (ref 0.0–13.0)
NEUT%: 69.7 % (ref 40.0–80.0)
NEUTROS ABS: 5.2 10*3/uL (ref 1.5–6.5)
Platelets: 294 10*3/uL (ref 145–400)
RBC: 4.37 10*6/uL (ref 4.20–5.70)
RDW: 13 % (ref 11.1–15.7)
WBC: 7.5 10*3/uL (ref 4.0–10.0)

## 2017-05-07 LAB — CMP (CANCER CENTER ONLY)
ALK PHOS: 60 U/L (ref 26–84)
ALT: 20 U/L (ref 10–47)
AST: 19 U/L (ref 11–38)
Albumin: 3.9 g/dL (ref 3.3–5.5)
BUN, Bld: 8 mg/dL (ref 7–22)
CO2: 29 mEq/L (ref 18–33)
Calcium: 9.7 mg/dL (ref 8.0–10.3)
Chloride: 100 mEq/L (ref 98–108)
Creat: 0.8 mg/dl (ref 0.6–1.2)
Glucose, Bld: 112 mg/dL (ref 73–118)
POTASSIUM: 3.9 meq/L (ref 3.3–4.7)
Sodium: 145 mEq/L (ref 128–145)
TOTAL PROTEIN: 7.9 g/dL (ref 6.4–8.1)
Total Bilirubin: 0.6 mg/dl (ref 0.20–1.60)

## 2017-05-07 NOTE — Progress Notes (Signed)
Hematology and Oncology Follow Up Visit  [name] [name] [id number] Aug 14, 1955 61 y.o. 05/07/2017   Principle Diagnosis:  Smoldering IgG Kappa Myeloma   Current Therapy:   Observation    Interim History:  [name] [name] is here today for follow-up.  [name] is under a little bit of stress.  Apparently, his brother and wife separated after 37 years.  [name] has now moved up here.  His mother also has moved up here from [place].  [name] was down in [place] for Thanksgiving.  [name] was on a [employer].  The [place] had a lot of feral pig's.  They did not shoot any pigs.  They did have some good barbecue.  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  This is still bothering him.  [name] does have postherpetic neuralgia.  Only saw him back in June, his M spike was 1.2 g/dL.  His IgG level was 1400 mg/dL.  His Kappa Lightchain was 7.3 mg/dL.  These are all a little bit higher but not enough that I would start him on treatment.  [name] has had no issues with fatigue.  [name] has had no fever.  [name] has had no cough.  There is been no nausea or vomiting.  [name] has had no rashes.  [name] has had no problems with bowels or bladder.  [name] is little constipated from his pain medications.  [name] does have diabetes.  [name] is on metformin.  Overall, his performance status is ECOG 1.  Medications:  Allergies as of 05/07/2017      Reactions   Aripiprazole    Other reaction(s): leg aching   Codeine    REACTION: hives   Duloxetine    Other reaction(s): ineffective   Fluoxetine    Other reaction(s): ineffective      Medication List        Accurate as of 05/07/17 11:11 AM. Always use your most recent med list.          ALPRAZolam 0.5 MG tablet Commonly known as:  XANAX Take 0.5 mg by mouth as directed.   aspirin 81 MG tablet Take 1 tablet (81 mg total) by mouth daily.   clopidogrel 75 MG tablet Commonly known as:  PLAVIX Take 75 mg by mouth daily.   escitalopram 20 MG tablet Commonly known as:  LEXAPRO Take 20 mg by mouth daily.   fentaNYL 50  MCG/HR Commonly known as:  DURAGESIC - dosed mcg/hr Place 1 patch onto the skin every other day.   FLOMAX 0.4 MG Caps capsule Generic drug:  tamsulosin Take 0.4 mg by mouth daily.   gabapentin 300 MG capsule Commonly known as:  NEURONTIN TAKE 3 TABS BY MOUTH IN THE  AM & 4 TABS BY MOUTH IN THE PM   metFORMIN 500 MG tablet Commonly known as:  GLUCOPHAGE Take 1,000 mg by mouth 2 (two) times daily.   nitroGLYCERIN 0.4 MG SL tablet Commonly known as:  NITROSTAT Place 1 tablet (0.4 mg total) under the tongue every 5 (five) minutes as needed for chest pain.   oxyCODONE-acetaminophen 5-325 MG tablet Commonly known as:  PERCOCET/ROXICET Take 1 tablet by mouth every 4 (four) hours as needed for moderate pain or severe pain.   pantoprazole 40 MG tablet Commonly known as:  PROTONIX Take 1 tablet (40 mg total) by mouth daily.   predniSONE 10 MG tablet Commonly known as:  DELTASONE Take 10 mg by mouth. Follow the instructions.   simvastatin 20 MG tablet Commonly known as:  ZOCOR Take 20 mg  by mouth daily.   sitaGLIPtin 100 MG tablet Commonly known as:  JANUVIA Take 100 mg by mouth daily.   zolpidem 10 MG tablet Commonly known as:  AMBIEN Take 10 mg by mouth at bedtime.       Allergies:  Allergies  Allergen Reactions  . Aripiprazole     Other reaction(s): leg aching  . Codeine     REACTION: hives  . Duloxetine     Other reaction(s): ineffective  . Fluoxetine     Other reaction(s): ineffective    Past Medical History, Surgical history, Social history, and Family History were reviewed and updated.  Review of Systems: As stated in the interim history  Physical Exam:  weight is 149 lb 6.4 oz (67.8 kg). His oral temperature is 98.4 F (36.9 C). His blood pressure is 122/78 and his pulse is 68. His respiration is 20 and oxygen saturation is 100%.   Wt Readings from Last 3 Encounters:  05/07/17 149 lb 6.4 oz (67.8 kg)  11/28/16 152 lb (68.9 kg)  04/01/16 153 lb  (69.4 kg)    Physical Exam  Constitutional: [name] is oriented to person, place, and time.  HENT:  Head: Normocephalic and atraumatic.  Mouth/Throat: Oropharynx is clear and moist.  Eyes: EOM are normal. Pupils are equal, round, and reactive to light.  Neck: Normal range of motion.  Cardiovascular: Normal rate, regular rhythm and normal heart sounds.  Pulmonary/Chest: Effort normal and breath sounds normal.  Abdominal: Soft. Bowel sounds are normal.  Musculoskeletal: Normal range of motion. [name] exhibits no edema, tenderness or deformity.  Lymphadenopathy:    [name] has no cervical adenopathy.  Neurological: [name] is alert and oriented to person, place, and time.  Skin: Skin is warm and dry. No rash noted. No erythema.  Psychiatric: [name] has a normal mood and affect. His behavior is normal. Judgment and thought content normal.  Vitals reviewed.   Lab Results  Component Value Date   WBC 7.5 05/07/2017   HGB 13.6 05/07/2017   HCT 40.2 05/07/2017   MCV 92 05/07/2017   PLT 294 05/07/2017   Lab Results  Component Value Date   FERRITIN 73.9 11/20/2009   IRON 76 11/20/2009   IRONPCTSAT 22.8 11/20/2009   Lab Results  Component Value Date   RBC 4.37 05/07/2017   Lab Results  Component Value Date   KAPLAMBRATIO 10.41 (H) 11/28/2016   Lab Results  Component Value Date   IGGSERUM 1,407 11/28/2016   IGMSERUM 21 11/28/2016   Lab Results  Component Value Date   MSPIKE 1.2 (H) 11/28/2016     Chemistry      Component Value Date/Time   NA 145 05/07/2017 0945   NA 139 07/25/2016 0815   K 3.9 05/07/2017 0945   K 4.0 07/25/2016 0815   CL 100 05/07/2017 0945   CO2 29 05/07/2017 0945   CO2 28 07/25/2016 0815   BUN 8 05/07/2017 0945   BUN 9.0 07/25/2016 0815   CREATININE 0.8 05/07/2017 0945   CREATININE 0.8 07/25/2016 0815      Component Value Date/Time   CALCIUM 9.7 05/07/2017 0945   CALCIUM 9.6 07/25/2016 0815   ALKPHOS 60 05/07/2017 0945   ALKPHOS 57 07/25/2016 0815   AST 19  05/07/2017 0945   AST 15 07/25/2016 0815   ALT 20 05/07/2017 0945   ALT 13 07/25/2016 0815   BILITOT 0.60 05/07/2017 0945   BILITOT 0.55 07/25/2016 0815     Impression and Plan: [name] [name] is a  61 yo white male with an IgG Kappa smoldering myeloma diagnosed 6 years [name]. [name] was treated initially with Velcade and Decadron and then Revlimid. So far, [name] has done well. His last bone marrow test in March showed only 7-10% plasma cells.   [name] will be interesting to see what his myeloma studies show.  I want to try to get him through the wintertime.  We will get him back in the spring.  Hopefully, things will get a little bit better with him as his family settles in up in [place].      12/5/201811:11 AM

## 2017-05-08 LAB — IGG, IGA, IGM
IGA/IMMUNOGLOBULIN A, SERUM: 92 mg/dL (ref 61–437)
IGM (IMMUNOGLOBIN M), SRM: 20 mg/dL (ref 20–172)
IgG, Qn, Serum: 1638 mg/dL — ABNORMAL HIGH (ref 700–1600)

## 2017-05-09 LAB — KAPPA/LAMBDA LIGHT CHAINS
IG LAMBDA FREE LIGHT CHAIN: 5.9 mg/L (ref 5.7–26.3)
Ig Kappa Free Light Chain: 107.1 mg/L — ABNORMAL HIGH (ref 3.3–19.4)
Kappa/Lambda FluidC Ratio: 18.15 — ABNORMAL HIGH (ref 0.26–1.65)

## 2017-05-13 LAB — PROTEIN ELECTROPHORESIS, SERUM, WITH REFLEX
A/G RATIO SPE: 1.1 (ref 0.7–1.7)
Albumin: 4 g/dL (ref 2.9–4.4)
Alpha 1: 0.3 g/dL (ref 0.0–0.4)
Alpha 2: 0.9 g/dL (ref 0.4–1.0)
BETA: 1.1 g/dL (ref 0.7–1.3)
GLOBULIN, TOTAL: 3.7 g/dL (ref 2.2–3.9)
Gamma Globulin: 1.5 g/dL (ref 0.4–1.8)
INTERPRETATION(SEE BELOW): 0
M-Spike, %: 1.2 g/dL — ABNORMAL HIGH
TOTAL PROTEIN: 7.7 g/dL (ref 6.0–8.5)

## 2017-06-24 ENCOUNTER — Inpatient Hospital Stay: Payer: Medicare Other | Attending: Hematology & Oncology | Admitting: Hematology & Oncology

## 2017-06-24 VITALS — BP 107/71 | HR 82 | Temp 98.0°F | Resp 20 | Wt 149.8 lb

## 2017-06-24 DIAGNOSIS — K5900 Constipation, unspecified: Secondary | ICD-10-CM | POA: Insufficient documentation

## 2017-06-24 DIAGNOSIS — B0229 Other postherpetic nervous system involvement: Secondary | ICD-10-CM | POA: Insufficient documentation

## 2017-06-24 DIAGNOSIS — D472 Monoclonal gammopathy: Secondary | ICD-10-CM

## 2017-06-24 DIAGNOSIS — G8929 Other chronic pain: Secondary | ICD-10-CM | POA: Diagnosis not present

## 2017-06-24 DIAGNOSIS — C9000 Multiple myeloma not having achieved remission: Secondary | ICD-10-CM | POA: Insufficient documentation

## 2017-06-24 MED ORDER — FAMCICLOVIR 500 MG PO TABS: 500.0000 mg | ORAL_TABLET | Freq: Every day | ORAL | 12 refills | Status: DC

## 2017-06-24 NOTE — Progress Notes (Signed)
Hematology and Oncology Follow Up Visit  [name] [name] [id number] May 07, 1956 62 y.o. 06/24/2017   Principle Diagnosis:  Smoldering IgG Kappa Myeloma   Current Therapy:   Observation    Interim History:  Mr. Bain is here today for follow-up.  He is doing okay.  He got through the holidays without too much stress.  Unfortunately, there is some still issues with his family.  His brother and wife are having issues.  They both have alcohol related problems.  She cannot be cared for by his brother.  As such, she I think is in a assisted living.  He is having problems with herpetic neuralgia on the right side of his face.  He is not on any type of suppressive agent.  I think we should try him on some Famvir (500 mg p.o. daily) to see if this can help.  We last saw him, his myeloma numbers are creeping up.  His M spike in December was 1.2 g/dL.  His IgG level was 1638 mg/dL.  His Kappa Lightchain was 10.7 mg/dL.  I still do not think we have to initiate any therapy.  He still is having chronic pain issues.  He is constipated.  He is on a fentanyl patch.  He is on oxycodone.    Overall, his performance status is ECOG 1.  Medications:  Allergies as of 06/24/2017      Reactions   Aripiprazole    Other reaction(s): leg aching   Codeine    REACTION: hives   Duloxetine    Other reaction(s): ineffective   Fluoxetine    Other reaction(s): ineffective      Medication List        Accurate as of 06/24/17 10:43 AM. Always use your most recent med list.          ALPRAZolam 0.5 MG tablet Commonly known as:  XANAX Take 0.5 mg by mouth as directed.   aspirin 81 MG tablet Take 1 tablet (81 mg total) by mouth daily.   clopidogrel 75 MG tablet Commonly known as:  PLAVIX Take 75 mg by mouth daily.   escitalopram 20 MG tablet Commonly known as:  LEXAPRO Take 20 mg by mouth daily.   fentaNYL 50 MCG/HR Commonly known as:  DURAGESIC - dosed mcg/hr Place 1 patch onto the skin every  other day.   FLOMAX 0.4 MG Caps capsule Generic drug:  tamsulosin Take 0.4 mg by mouth daily.   gabapentin 300 MG capsule Commonly known as:  NEURONTIN TAKE 3 TABS BY MOUTH IN THE  AM & 4 TABS BY MOUTH IN THE PM   metFORMIN 500 MG tablet Commonly known as:  GLUCOPHAGE Take 1,000 mg by mouth 2 (two) times daily.   nitroGLYCERIN 0.4 MG SL tablet Commonly known as:  NITROSTAT Place 1 tablet (0.4 mg total) under the tongue every 5 (five) minutes as needed for chest pain.   oxyCODONE-acetaminophen 5-325 MG tablet Commonly known as:  PERCOCET/ROXICET Take 1 tablet by mouth every 4 (four) hours as needed for moderate pain or severe pain.   pantoprazole 40 MG tablet Commonly known as:  PROTONIX Take 1 tablet (40 mg total) by mouth daily.   simvastatin 20 MG tablet Commonly known as:  ZOCOR Take 20 mg by mouth daily.   sitaGLIPtin 100 MG tablet Commonly known as:  JANUVIA Take 100 mg by mouth daily.   zolpidem 10 MG tablet Commonly known as:  AMBIEN Take 10 mg by mouth at bedtime.  Allergies:  Allergies  Allergen Reactions  . Aripiprazole     Other reaction(s): leg aching  . Codeine     REACTION: hives  . Duloxetine     Other reaction(s): ineffective  . Fluoxetine     Other reaction(s): ineffective    Past Medical History, Surgical history, Social history, and Family History were reviewed and updated.  Review of Systems: Review of Systems  Constitutional: Negative.   HENT: Negative.   Eyes: Negative.   Respiratory: Negative.   Cardiovascular: Negative.   Gastrointestinal: Negative.   Genitourinary: Negative.   Musculoskeletal: Positive for back pain and myalgias.  Skin: Positive for rash.  Neurological: Positive for sensory change.  Psychiatric/Behavioral: The patient is nervous/anxious.     Physical Exam:  weight is 149 lb 12 oz (67.9 kg). His oral temperature is 98 F (36.7 C). His blood pressure is 107/71 and his pulse is 82. His respiration is  20 and oxygen saturation is 100%.   Wt Readings from Last 3 Encounters:  06/24/17 149 lb 12 oz (67.9 kg)  05/07/17 149 lb 6.4 oz (67.8 kg)  11/28/16 152 lb (68.9 kg)    Physical Exam  Constitutional: He is oriented to person, place, and time.  HENT:  Head: Normocephalic and atraumatic.  Mouth/Throat: Oropharynx is clear and moist.  Eyes: EOM are normal. Pupils are equal, round, and reactive to light.  Neck: Normal range of motion.  Cardiovascular: Normal rate, regular rhythm and normal heart sounds.  Pulmonary/Chest: Effort normal and breath sounds normal.  Abdominal: Soft. Bowel sounds are normal.  Musculoskeletal: Normal range of motion. He exhibits no edema, tenderness or deformity.  Lymphadenopathy:    He has no cervical adenopathy.  Neurological: He is alert and oriented to person, place, and time.  Skin: Skin is warm and dry. No rash noted. No erythema.  Psychiatric: He has a normal mood and affect. His behavior is normal. Judgment and thought content normal.  Vitals reviewed.   Lab Results  Component Value Date   WBC 7.5 05/07/2017   HGB 13.6 05/07/2017   HCT 40.2 05/07/2017   MCV 92 05/07/2017   PLT 294 05/07/2017   Lab Results  Component Value Date   FERRITIN 73.9 11/20/2009   IRON 76 11/20/2009   IRONPCTSAT 22.8 11/20/2009   Lab Results  Component Value Date   RBC 4.37 05/07/2017   Lab Results  Component Value Date   KAPLAMBRATIO 18.15 (H) 05/07/2017   Lab Results  Component Value Date   IGGSERUM 1,638 (H) 05/07/2017   IGMSERUM 20 05/07/2017   Lab Results  Component Value Date   MSPIKE 1.2 (H) 05/07/2017     Chemistry      Component Value Date/Time   NA 145 05/07/2017 0945   NA 139 07/25/2016 0815   K 3.9 05/07/2017 0945   K 4.0 07/25/2016 0815   CL 100 05/07/2017 0945   CO2 29 05/07/2017 0945   CO2 28 07/25/2016 0815   BUN 8 05/07/2017 0945   BUN 9.0 07/25/2016 0815   CREATININE 0.8 05/07/2017 0945   CREATININE 0.8 07/25/2016 0815       Component Value Date/Time   CALCIUM 9.7 05/07/2017 0945   CALCIUM 9.6 07/25/2016 0815   ALKPHOS 60 05/07/2017 0945   ALKPHOS 57 07/25/2016 0815   AST 19 05/07/2017 0945   AST 15 07/25/2016 0815   ALT 20 05/07/2017 0945   ALT 13 07/25/2016 0815   BILITOT 0.60 05/07/2017 0945   BILITOT 0.55 07/25/2016  7001     Impression and Plan: Mr. Berthold is a 62 yo white male with an IgG Kappa smoldering myeloma diagnosed 6 years ago. He was treated initially with Velcade and Decadron and then Revlimid. So far, he has done well. His last bone marrow test in March showed only 7-10% plasma cells.   I still do not see a need that we have to initiate therapy on him.  I will plan to get him back in about 3 months.  I think this would be reasonable.  We will try to get him through the bad weather.      1/22/201910:43 AM

## 2017-07-17 ENCOUNTER — Telehealth: Payer: Self-pay | Admitting: Hematology & Oncology

## 2017-07-17 ENCOUNTER — Encounter: Payer: Self-pay | Admitting: Hematology & Oncology

## 2017-07-17 NOTE — Telephone Encounter (Signed)
MEDICAL RECORD REQUEST  RECORDS FAXED [facility] [place]  [place] [address]  [place]: [place]: [phone]

## 2017-09-09 ENCOUNTER — Inpatient Hospital Stay: Payer: Medicare Other

## 2017-09-09 ENCOUNTER — Inpatient Hospital Stay: Payer: Medicare Other | Admitting: Family

## 2017-09-23 ENCOUNTER — Inpatient Hospital Stay: Payer: Medicare Other | Admitting: Hematology & Oncology

## 2017-09-23 ENCOUNTER — Inpatient Hospital Stay: Payer: Medicare Other | Attending: Hematology & Oncology

## 2017-09-23 DIAGNOSIS — G629 Polyneuropathy, unspecified: Secondary | ICD-10-CM | POA: Insufficient documentation

## 2017-09-23 DIAGNOSIS — C9000 Multiple myeloma not having achieved remission: Secondary | ICD-10-CM | POA: Insufficient documentation

## 2017-09-29 ENCOUNTER — Inpatient Hospital Stay: Payer: Medicare Other

## 2017-09-29 ENCOUNTER — Inpatient Hospital Stay (HOSPITAL_BASED_OUTPATIENT_CLINIC_OR_DEPARTMENT_OTHER): Payer: Medicare Other | Admitting: Hematology & Oncology

## 2017-09-29 ENCOUNTER — Other Ambulatory Visit: Payer: Self-pay

## 2017-09-29 VITALS — BP 107/76 | HR 78 | Temp 98.1°F | Resp 17 | Wt 154.8 lb

## 2017-09-29 DIAGNOSIS — M545 Low back pain: Secondary | ICD-10-CM | POA: Diagnosis not present

## 2017-09-29 DIAGNOSIS — C9000 Multiple myeloma not having achieved remission: Secondary | ICD-10-CM

## 2017-09-29 DIAGNOSIS — D472 Monoclonal gammopathy: Secondary | ICD-10-CM

## 2017-09-29 DIAGNOSIS — G629 Polyneuropathy, unspecified: Secondary | ICD-10-CM

## 2017-09-29 LAB — CMP (CANCER CENTER ONLY)
ALT: 23 U/L (ref 10–47)
ANION GAP: 9 (ref 5–15)
AST: 31 U/L (ref 11–38)
Albumin: 4.1 g/dL (ref 3.5–5.0)
Alkaline Phosphatase: 80 U/L (ref 26–84)
BUN: 9 mg/dL (ref 7–22)
CO2: 30 mmol/L (ref 18–33)
Calcium: 9.4 mg/dL (ref 8.0–10.3)
Chloride: 101 mmol/L (ref 98–108)
Creatinine: 0.9 mg/dL (ref 0.60–1.20)
GLUCOSE: 142 mg/dL — AB (ref 73–118)
POTASSIUM: 4.3 mmol/L (ref 3.3–4.7)
Sodium: 140 mmol/L (ref 128–145)
Total Bilirubin: 0.6 mg/dL (ref 0.2–1.6)
Total Protein: 7.9 g/dL (ref 6.4–8.1)

## 2017-09-29 LAB — CBC WITH DIFFERENTIAL (CANCER CENTER ONLY)
Basophils Absolute: 0 10*3/uL (ref 0.0–0.1)
Basophils Relative: 1 %
EOS PCT: 2 %
Eosinophils Absolute: 0.2 10*3/uL (ref 0.0–0.5)
HCT: 40.6 % (ref 38.7–49.9)
Hemoglobin: 13.5 g/dL (ref 13.0–17.1)
LYMPHS PCT: 16 %
Lymphs Abs: 1 10*3/uL (ref 0.9–3.3)
MCH: 31.3 pg (ref 28.0–33.4)
MCHC: 33.3 g/dL (ref 32.0–35.9)
MCV: 94.2 fL (ref 82.0–98.0)
MONO ABS: 0.5 10*3/uL (ref 0.1–0.9)
Monocytes Relative: 8 %
Neutro Abs: 4.5 10*3/uL (ref 1.5–6.5)
Neutrophils Relative %: 73 %
PLATELETS: 206 10*3/uL (ref 145–400)
RBC: 4.31 MIL/uL (ref 4.20–5.70)
RDW: 12.9 % (ref 11.1–15.7)
WBC Count: 6.2 10*3/uL (ref 4.0–10.0)

## 2017-09-29 LAB — LACTATE DEHYDROGENASE: LDH: 172 U/L (ref 125–245)

## 2017-09-29 MED ORDER — SUVOREXANT 15 MG PO TABS: 15.0000 mg | ORAL_TABLET | Freq: Every evening | ORAL | 2 refills | Status: DC | PRN

## 2017-09-29 NOTE — Progress Notes (Signed)
Hematology and Oncology Follow Up Visit  [name] [name] [id number] Jul 26, 1955 62 y.o. 09/29/2017   Principle Diagnosis:  Smoldering IgG Kappa Myeloma   Current Therapy:   Observation    Interim History:  [name] is here today for follow-up.  He is having some more difficulties.  He is having some more back pain.  He is having some more neuropathy.  We will go ahead and get him set up with a PET scan.  This will tell us if the smoldering myeloma is now active myeloma.  We last saw him in December, his M spike was 1.5 g/dL.  His IgG G level was 1638 milligrams per deciliter.  His kappa light chain was 10.7 mg/dL.  He still has issues with his family.  There are issues that he is trying to help with.  He has had no fever.  He got through the winter without any infections or influenza.  He has had no cough or shortness of breath.  He has had no nausea or vomiting.  He has had no bleeding or bruising.  Overall, his performance status is ECOG 1.  Medications:  Allergies as of 09/29/2017      Reactions   Aripiprazole    Other reaction(s): leg aching   Codeine    REACTION: hives   Duloxetine    Other reaction(s): ineffective   Fluoxetine    Other reaction(s): ineffective      Medication List        Accurate as of 09/29/17  2:35 PM. Always use your most recent med list.          ALPRAZolam 0.5 MG tablet Commonly known as:  XANAX Take 0.5 mg by mouth as directed.   aspirin 81 MG tablet Take 1 tablet (81 mg total) by mouth daily.   clopidogrel 75 MG tablet Commonly known as:  PLAVIX Take 75 mg by mouth daily.   escitalopram 20 MG tablet Commonly known as:  LEXAPRO Take 20 mg by mouth daily.   fentaNYL 50 MCG/HR Commonly known as:  DURAGESIC - dosed mcg/hr Place 1 patch onto the skin every other day.   FLOMAX 0.4 MG Caps capsule Generic drug:  tamsulosin Take 0.4 mg by mouth daily.   gabapentin 300 MG capsule Commonly known as:  NEURONTIN TAKE 3 TABS BY  MOUTH IN THE  AM & 4 TABS BY MOUTH IN THE PM   metFORMIN 500 MG tablet Commonly known as:  GLUCOPHAGE Take 1,000 mg by mouth 2 (two) times daily.   nitroGLYCERIN 0.4 MG SL tablet Commonly known as:  NITROSTAT Place 1 tablet (0.4 mg total) under the tongue every 5 (five) minutes as needed for chest pain.   oxyCODONE-acetaminophen 5-325 MG tablet Commonly known as:  PERCOCET/ROXICET Take 1 tablet by mouth every 4 (four) hours as needed for moderate pain or severe pain.   pantoprazole 40 MG tablet Commonly known as:  PROTONIX Take 1 tablet (40 mg total) by mouth daily.   simvastatin 20 MG tablet Commonly known as:  ZOCOR Take 20 mg by mouth daily.   sitaGLIPtin 100 MG tablet Commonly known as:  JANUVIA Take 100 mg by mouth daily.   zolpidem 10 MG tablet Commonly known as:  AMBIEN Take 10 mg by mouth at bedtime.       Allergies:  Allergies  Allergen Reactions  . Aripiprazole     Other reaction(s): leg aching  . Codeine     REACTION: hives  . Duloxetine  Other reaction(s): ineffective  . Fluoxetine     Other reaction(s): ineffective    Past Medical History, Surgical history, Social history, and Family History were reviewed and updated.  Review of Systems: Review of Systems  Constitutional: Negative.   HENT: Negative.   Eyes: Negative.   Respiratory: Negative.   Cardiovascular: Negative.   Gastrointestinal: Negative.   Genitourinary: Negative.   Musculoskeletal: Positive for back pain and myalgias.  Skin: Positive for rash.  Neurological: Positive for sensory change.  Psychiatric/Behavioral: The patient is nervous/anxious.     Physical Exam:  weight is 154 lb 12.8 oz (70.2 kg). His oral temperature is 98.1 F (36.7 C). His blood pressure is 107/76 and his pulse is 78. His respiration is 17 and oxygen saturation is 99%.   Wt Readings from Last 3 Encounters:  09/29/17 154 lb 12.8 oz (70.2 kg)  06/24/17 149 lb 12 oz (67.9 kg)  05/07/17 149 lb 6.4 oz  (67.8 kg)    Physical Exam  Constitutional: He is oriented to person, place, and time.  HENT:  Head: Normocephalic and atraumatic.  Mouth/Throat: Oropharynx is clear and moist.  Eyes: Pupils are equal, round, and reactive to light. EOM are normal.  Neck: Normal range of motion.  Cardiovascular: Normal rate, regular rhythm and normal heart sounds.  Pulmonary/Chest: Effort normal and breath sounds normal.  Abdominal: Soft. Bowel sounds are normal.  Musculoskeletal: Normal range of motion. He exhibits no edema, tenderness or deformity.  Lymphadenopathy:    He has no cervical adenopathy.  Neurological: He is alert and oriented to person, place, and time.  Skin: Skin is warm and dry. No rash noted. No erythema.  Psychiatric: He has a normal mood and affect. His behavior is normal. Judgment and thought content normal.  Vitals reviewed.   Lab Results  Component Value Date   WBC 6.2 09/29/2017   HGB 13.5 09/29/2017   HCT 40.6 09/29/2017   MCV 94.2 09/29/2017   PLT 206 09/29/2017   Lab Results  Component Value Date   FERRITIN 73.9 11/20/2009   IRON 76 11/20/2009   IRONPCTSAT 22.8 11/20/2009   Lab Results  Component Value Date   RBC 4.31 09/29/2017   Lab Results  Component Value Date   KAPLAMBRATIO 18.15 (H) 05/07/2017   Lab Results  Component Value Date   IGGSERUM 1,638 (H) 05/07/2017   IGMSERUM 20 05/07/2017   Lab Results  Component Value Date   MSPIKE 1.2 (H) 05/07/2017     Chemistry      Component Value Date/Time   NA 140 09/29/2017 1330   NA 145 05/07/2017 0945   NA 139 07/25/2016 0815   K 4.3 09/29/2017 1330   K 3.9 05/07/2017 0945   K 4.0 07/25/2016 0815   CL 101 09/29/2017 1330   CL 100 05/07/2017 0945   CO2 30 09/29/2017 1330   CO2 29 05/07/2017 0945   CO2 28 07/25/2016 0815   BUN 9 09/29/2017 1330   BUN 8 05/07/2017 0945   BUN 9.0 07/25/2016 0815   CREATININE 0.90 09/29/2017 1330   CREATININE 0.8 05/07/2017 0945   CREATININE 0.8 07/25/2016 0815        Component Value Date/Time   CALCIUM 9.4 09/29/2017 1330   CALCIUM 9.7 05/07/2017 0945   CALCIUM 9.6 07/25/2016 0815   ALKPHOS 80 09/29/2017 1330   ALKPHOS 60 05/07/2017 0945   ALKPHOS 57 07/25/2016 0815   AST 31 09/29/2017 1330   AST 15 07/25/2016 0815   ALT 23 09/29/2017  1330   ALT 20 05/07/2017 0945   ALT 13 07/25/2016 0815   BILITOT 0.6 09/29/2017 1330   BILITOT 0.55 07/25/2016 0815     Impression and Plan: Mr. Slates is a 62 yo white male with an IgG Kappa smoldering myeloma diagnosed 6 1/2 years ago. He was treated initially with Velcade and Decadron and then Revlimid. So far, he has done well. His last bone marrow test in March 2018 showed only 7-10% plasma cells.   We might be at a "Crossroads."  We will see what his myeloma levels look like.  If his M spike is higher, then I think we might have to get him back into treatment.  I would think that Velcade/Revlimid would be reasonable.  We could consider Velcade/Cytoxan also.  I would like to get him back in 4 months regardless.  If we find that he is having issues were that the PET scan looks suspicious, or if his myeloma panel is worse, then we will get him back sooner.  4/29/20192:35 PM

## 2017-09-30 LAB — KAPPA/LAMBDA LIGHT CHAINS
Kappa free light chain: 113.9 mg/L — ABNORMAL HIGH (ref 3.3–19.4)
Kappa, lambda light chain ratio: 14.79 — ABNORMAL HIGH (ref 0.26–1.65)
Lambda free light chains: 7.7 mg/L (ref 5.7–26.3)

## 2017-09-30 LAB — IGG, IGA, IGM
IGG (IMMUNOGLOBIN G), SERUM: 1734 mg/dL — AB (ref 700–1600)
IGM (IMMUNOGLOBULIN M), SRM: 20 mg/dL (ref 20–172)
IgA: 98 mg/dL (ref 61–437)

## 2017-10-03 LAB — IMMUNOFIXATION REFLEX, SERUM
IGA: 96 mg/dL (ref 61–437)
IGG (IMMUNOGLOBIN G), SERUM: 1626 mg/dL — AB (ref 700–1600)
IgM (Immunoglobulin M), Srm: 18 mg/dL — ABNORMAL LOW (ref 20–172)

## 2017-10-03 LAB — PROTEIN ELECTROPHORESIS, SERUM, WITH REFLEX
A/G RATIO SPE: 1.1 (ref 0.7–1.7)
ALBUMIN ELP: 3.9 g/dL (ref 2.9–4.4)
ALPHA-1-GLOBULIN: 0.3 g/dL (ref 0.0–0.4)
Alpha-2-Globulin: 0.8 g/dL (ref 0.4–1.0)
Beta Globulin: 0.9 g/dL (ref 0.7–1.3)
Gamma Globulin: 1.4 g/dL (ref 0.4–1.8)
Globulin, Total: 3.4 g/dL (ref 2.2–3.9)
M-Spike, %: 1.1 g/dL — ABNORMAL HIGH
SPEP Interpretation: 0
Total Protein ELP: 7.3 g/dL (ref 6.0–8.5)

## 2017-10-06 ENCOUNTER — Ambulatory Visit (HOSPITAL_COMMUNITY): Payer: Medicare Other

## 2017-10-07 ENCOUNTER — Telehealth: Payer: Self-pay | Admitting: *Deleted

## 2017-10-07 NOTE — Telephone Encounter (Addendum)
Patient is aware of results  ----- Message from [name], MD sent at 10/06/2017  4:50 PM EDT ----- Call - the myeloma numbers are stable!!!  This is great news!!  [name] [name]

## 2017-10-16 ENCOUNTER — Encounter (HOSPITAL_COMMUNITY)
Admission: RE | Admit: 2017-10-16 | Discharge: 2017-10-16 | Disposition: A | Discharge status: Home / Self Care | Payer: Medicare Other | Source: Ambulatory Visit | Attending: Hematology & Oncology | Admitting: Hematology & Oncology

## 2017-10-16 DIAGNOSIS — D472 Monoclonal gammopathy: Secondary | ICD-10-CM

## 2017-10-16 DIAGNOSIS — C9000 Multiple myeloma not having achieved remission: Secondary | ICD-10-CM | POA: Diagnosis present

## 2017-10-16 LAB — GLUCOSE, CAPILLARY: GLUCOSE-CAPILLARY: 123 mg/dL — AB (ref 65–99)

## 2017-10-16 MED ORDER — FLUDEOXYGLUCOSE F - 18 (FDG) INJECTION
7.6000 | Freq: Once | INTRAVENOUS | Status: AC | PRN
  Administered 2017-10-16: 7.6 via INTRAVENOUS

## 2017-10-22 ENCOUNTER — Telehealth: Payer: Self-pay | Admitting: *Deleted

## 2017-10-22 NOTE — Telephone Encounter (Addendum)
Patient is aware of results  ----- Message from [name], MD sent at 10/21/2017  8:06 PM EDT ----- Call - the PET scan does NOT show any active myeloma in the bones!!!  Laurey Arrow

## 2018-01-20 ENCOUNTER — Ambulatory Visit: Payer: Medicare Other | Admitting: Hematology & Oncology

## 2018-01-20 ENCOUNTER — Other Ambulatory Visit: Payer: Medicare Other

## 2018-01-28 ENCOUNTER — Other Ambulatory Visit: Payer: Self-pay

## 2018-01-28 ENCOUNTER — Encounter: Payer: Self-pay | Admitting: Hematology & Oncology

## 2018-01-28 ENCOUNTER — Inpatient Hospital Stay: Payer: Medicare Other | Attending: Hematology & Oncology

## 2018-01-28 ENCOUNTER — Inpatient Hospital Stay (HOSPITAL_BASED_OUTPATIENT_CLINIC_OR_DEPARTMENT_OTHER): Payer: Medicare Other | Admitting: Hematology & Oncology

## 2018-01-28 VITALS — BP 130/90 | HR 61 | Temp 98.2°F | Resp 20 | Wt 153.1 lb

## 2018-01-28 DIAGNOSIS — Z7982 Long term (current) use of aspirin: Secondary | ICD-10-CM | POA: Diagnosis not present

## 2018-01-28 DIAGNOSIS — C9000 Multiple myeloma not having achieved remission: Secondary | ICD-10-CM | POA: Diagnosis present

## 2018-01-28 DIAGNOSIS — Z85828 Personal history of other malignant neoplasm of skin: Secondary | ICD-10-CM | POA: Diagnosis not present

## 2018-01-28 DIAGNOSIS — D472 Monoclonal gammopathy: Secondary | ICD-10-CM

## 2018-01-28 LAB — CBC WITH DIFFERENTIAL (CANCER CENTER ONLY)
BASOS ABS: 0 10*3/uL (ref 0.0–0.1)
BASOS PCT: 1 %
EOS PCT: 2 %
Eosinophils Absolute: 0.1 10*3/uL (ref 0.0–0.5)
HCT: 40.5 % (ref 38.7–49.9)
Hemoglobin: 13.2 g/dL (ref 13.0–17.1)
Lymphocytes Relative: 16 %
Lymphs Abs: 1.2 10*3/uL (ref 0.9–3.3)
MCH: 30.8 pg (ref 28.0–33.4)
MCHC: 32.6 g/dL (ref 32.0–35.9)
MCV: 94.6 fL (ref 82.0–98.0)
MONO ABS: 0.6 10*3/uL (ref 0.1–0.9)
Monocytes Relative: 8 %
NEUTROS ABS: 5.4 10*3/uL (ref 1.5–6.5)
Neutrophils Relative %: 73 %
PLATELETS: 206 10*3/uL (ref 145–400)
RBC: 4.28 MIL/uL (ref 4.20–5.70)
RDW: 13.2 % (ref 11.1–15.7)
WBC Count: 7.3 10*3/uL (ref 4.0–10.0)

## 2018-01-28 LAB — CMP (CANCER CENTER ONLY)
ALBUMIN: 3.9 g/dL (ref 3.5–5.0)
ALT: 19 U/L (ref 10–47)
AST: 27 U/L (ref 11–38)
Alkaline Phosphatase: 76 U/L (ref 26–84)
Anion gap: 4 — ABNORMAL LOW (ref 5–15)
BUN: 6 mg/dL — ABNORMAL LOW (ref 7–22)
CALCIUM: 9.4 mg/dL (ref 8.0–10.3)
CO2: 32 mmol/L (ref 18–33)
CREATININE: 0.7 mg/dL (ref 0.60–1.20)
Chloride: 102 mmol/L (ref 98–108)
GLUCOSE: 106 mg/dL (ref 73–118)
Potassium: 4 mmol/L (ref 3.3–4.7)
SODIUM: 138 mmol/L (ref 128–145)
Total Bilirubin: 0.6 mg/dL (ref 0.2–1.6)
Total Protein: 7.6 g/dL (ref 6.4–8.1)

## 2018-01-28 NOTE — Progress Notes (Signed)
Hematology and Oncology Follow Up Visit  [name] [name] [id number] [birth year] 62 y.o. 01/28/2018   Principle Diagnosis:  Smoldering IgG Kappa Myeloma   Current Therapy:   Observation    Interim History:  [name] [name] is here today for follow-up.  He is doing pretty well.  He just had a basal cell frozen off.  This was on his forehead.  Hopefully, this will not become a problem.  He is getting ready to go to [place].  He actually is going the first week in November.  He is looking forward to this.  His smoldering myeloma has not been a problem to date.  He had a myeloma studies done back in April.  His M spike was 1.1 g/dL.  His IgG level was 1626 mg/dL.  His Kappa Lightchain was up slightly at 11.2 mg/dL.  He had a PET scan done in May.  PET scan did not show any active bone lesions suggestive of myeloma.  He has noted some bruising.  He is on Plavix and aspirin.  I would think that this is the likely reason for bruising.  He is had no change in bowel or bladder habits.  He has had no cough or shortness of breath.  He has had no infections.  He said no fever.   Overall, his performance status is ECOG 1.  Medications:  Allergies as of 01/28/2018      Reactions   Aripiprazole    Other reaction(s): leg aching   Codeine    REACTION: hives   Duloxetine    Other reaction(s): ineffective   Fluoxetine    Other reaction(s): ineffective      Medication List        Accurate as of 01/28/18  1:14 PM. Always use your most recent med list.          ALPRAZolam 0.5 MG tablet Commonly known as:  XANAX Take 0.5 mg by mouth as directed.   aspirin 81 MG tablet Take 1 tablet (81 mg total) by mouth daily.   clobetasol 0.05 % external solution Commonly known as:  TEMOVATE   clopidogrel 75 MG tablet Commonly known as:  PLAVIX Take 75 mg by mouth daily.   doxycycline 100 MG tablet Commonly known as:  VIBRA-TABS 100 mg.   EPINEPHrine 0.3 mg/0.3 mL Soaj injection Commonly known as:   EPI-PEN   escitalopram 20 MG tablet Commonly known as:  LEXAPRO Take 20 mg by mouth daily.   fentaNYL 50 MCG/HR Commonly known as:  DURAGESIC - dosed mcg/hr Place 1 patch onto the skin every other day.   FLOMAX 0.4 MG Caps capsule Generic drug:  tamsulosin Take 0.4 mg by mouth daily.   gabapentin 300 MG capsule Commonly known as:  NEURONTIN TAKE 3 TABS BY MOUTH IN THE  AM & 4 TABS BY MOUTH IN THE PM   metFORMIN 500 MG tablet Commonly known as:  GLUCOPHAGE Take 1,000 mg by mouth 2 (two) times daily.   nitroGLYCERIN 0.4 MG SL tablet Commonly known as:  NITROSTAT Place 1 tablet (0.4 mg total) under the tongue every 5 (five) minutes as needed for chest pain.   oxyCODONE-acetaminophen 5-325 MG tablet Commonly known as:  PERCOCET/ROXICET Take 1 tablet by mouth every 4 (four) hours as needed for moderate pain or severe pain.   pantoprazole 40 MG tablet Commonly known as:  PROTONIX Take 1 tablet (40 mg total) by mouth daily.   predniSONE 10 MG tablet Commonly known as:  DELTASONE 10 mg  as needed. Tapered dosing when needed.   simvastatin 20 MG tablet Commonly known as:  ZOCOR Take 20 mg by mouth daily.   sitaGLIPtin 100 MG tablet Commonly known as:  JANUVIA Take 100 mg by mouth daily.   Suvorexant 15 MG Tabs Take 15 mg by mouth at bedtime as needed.   zolpidem 10 MG tablet Commonly known as:  AMBIEN Take 10 mg by mouth at bedtime.       Allergies:  Allergies  Allergen Reactions  . Aripiprazole     Other reaction(s): leg aching  . Codeine     REACTION: hives  . Duloxetine     Other reaction(s): ineffective  . Fluoxetine     Other reaction(s): ineffective    Past Medical History, Surgical history, Social history, and Family History were reviewed and updated.  Review of Systems: Review of Systems  Constitutional: Negative.   HENT: Negative.   Eyes: Negative.   Respiratory: Negative.   Cardiovascular: Negative.   Gastrointestinal: Negative.     Genitourinary: Negative.   Musculoskeletal: Positive for back pain and myalgias.  Skin: Positive for rash.  Neurological: Positive for sensory change.  Psychiatric/Behavioral: The patient is nervous/anxious.     Physical Exam:  weight is 153 lb 1.9 oz (69.5 kg). His oral temperature is 98.2 F (36.8 C). His blood pressure is 130/90 and his pulse is 61. His respiration is 20 and oxygen saturation is 100%.   Wt Readings from Last 3 Encounters:  01/28/18 153 lb 1.9 oz (69.5 kg)  09/29/17 154 lb 12.8 oz (70.2 kg)  06/24/17 149 lb 12 oz (67.9 kg)    Physical Exam  Constitutional: He is oriented to person, place, and time.  HENT:  Head: Normocephalic and atraumatic.  Mouth/Throat: Oropharynx is clear and moist.  Eyes: Pupils are equal, round, and reactive to light. EOM are normal.  Neck: Normal range of motion.  Cardiovascular: Normal rate, regular rhythm and normal heart sounds.  Pulmonary/Chest: Effort normal and breath sounds normal.  Abdominal: Soft. Bowel sounds are normal.  Musculoskeletal: Normal range of motion. He exhibits no edema, tenderness or deformity.  Lymphadenopathy:    He has no cervical adenopathy.  Neurological: He is alert and oriented to person, place, and time.  Skin: Skin is warm and dry. No rash noted. No erythema.  Psychiatric: He has a normal mood and affect. His behavior is normal. Judgment and thought content normal.  Vitals reviewed.   Lab Results  Component Value Date   WBC 7.3 01/28/2018   HGB 13.2 01/28/2018   HCT 40.5 01/28/2018   MCV 94.6 01/28/2018   PLT 206 01/28/2018   Lab Results  Component Value Date   FERRITIN 73.9 11/20/2009   IRON 76 11/20/2009   IRONPCTSAT 22.8 11/20/2009   Lab Results  Component Value Date   RBC 4.28 01/28/2018   Lab Results  Component Value Date   KPAFRELGTCHN 113.9 (H) 09/29/2017   LAMBDASER 7.7 09/29/2017   KAPLAMBRATIO 14.79 (H) 09/29/2017   Lab Results  Component Value Date   IGGSERUM 1,626  (H) 09/29/2017   IGA 96 09/29/2017   IGMSERUM 18 (L) 09/29/2017   Lab Results  Component Value Date   TOTALPROTELP 7.3 09/29/2017   ALBUMINELP 3.9 09/29/2017   A1GS 0.3 09/29/2017   A2GS 0.8 09/29/2017   BETS 0.9 09/29/2017   GAMS 1.4 09/29/2017   MSPIKE 1.1 (H) 09/29/2017     Chemistry      Component Value Date/Time   NA 140  09/29/2017 1330   NA 145 05/07/2017 0945   NA 139 07/25/2016 0815   K 4.3 09/29/2017 1330   K 3.9 05/07/2017 0945   K 4.0 07/25/2016 0815   CL 101 09/29/2017 1330   CL 100 05/07/2017 0945   CO2 30 09/29/2017 1330   CO2 29 05/07/2017 0945   CO2 28 07/25/2016 0815   BUN 9 09/29/2017 1330   BUN 8 05/07/2017 0945   BUN 9.0 07/25/2016 0815   CREATININE 0.90 09/29/2017 1330   CREATININE 0.8 05/07/2017 0945   CREATININE 0.8 07/25/2016 0815      Component Value Date/Time   CALCIUM 9.4 09/29/2017 1330   CALCIUM 9.7 05/07/2017 0945   CALCIUM 9.6 07/25/2016 0815   ALKPHOS 80 09/29/2017 1330   ALKPHOS 60 05/07/2017 0945   ALKPHOS 57 07/25/2016 0815   AST 31 09/29/2017 1330   AST 15 07/25/2016 0815   ALT 23 09/29/2017 1330   ALT 20 05/07/2017 0945   ALT 13 07/25/2016 0815   BILITOT 0.6 09/29/2017 1330   BILITOT 0.55 07/25/2016 0815     Impression and Plan: Mr. Schwer is a 62 yo white male with an IgG Kappa smoldering myeloma diagnosed 6 1/2 years ago. He was treated initially with Velcade and Decadron and then Revlimid. So far, he has done well. His last bone marrow test in March 2018 showed only 7-10% plasma cells.   He is doing pretty well from my point of view.  I would be surprised if his myeloma numbers were much higher.  For right now, we will get him back in January.  I think we get him through the holiday season.  I know he will have a great time down in [place].    8/28/20191:14 PM

## 2018-01-28 NOTE — Progress Notes (Signed)
01/28/2018 Had skin cancer removed 01/27/18 from top of scalp. Has area oozing serous fluid.

## 2018-01-29 LAB — IGG, IGA, IGM
IGG (IMMUNOGLOBIN G), SERUM: 1461 mg/dL (ref 700–1600)
IgA: 79 mg/dL (ref 61–437)
IgM (Immunoglobulin M), Srm: 18 mg/dL — ABNORMAL LOW (ref 20–172)

## 2018-01-29 LAB — KAPPA/LAMBDA LIGHT CHAINS
Kappa free light chain: 109.1 mg/L — ABNORMAL HIGH (ref 3.3–19.4)
Kappa, lambda light chain ratio: 14.55 — ABNORMAL HIGH (ref 0.26–1.65)
Lambda free light chains: 7.5 mg/L (ref 5.7–26.3)

## 2018-01-29 LAB — BETA 2 MICROGLOBULIN, SERUM: BETA 2 MICROGLOBULIN: 1.7 mg/L (ref 0.6–2.4)

## 2018-01-31 LAB — IMMUNOFIXATION REFLEX, SERUM
IgA: 86 mg/dL (ref 61–437)
IgG (Immunoglobin G), Serum: 1519 mg/dL (ref 700–1600)
IgM (Immunoglobulin M), Srm: 18 mg/dL — ABNORMAL LOW (ref 20–172)

## 2018-01-31 LAB — PROTEIN ELECTROPHORESIS, SERUM, WITH REFLEX
A/G Ratio: 1.3 (ref 0.7–1.7)
ALPHA-1-GLOBULIN: 0.2 g/dL (ref 0.0–0.4)
Albumin ELP: 4 g/dL (ref 2.9–4.4)
Alpha-2-Globulin: 0.7 g/dL (ref 0.4–1.0)
Beta Globulin: 0.9 g/dL (ref 0.7–1.3)
GLOBULIN, TOTAL: 3.2 g/dL (ref 2.2–3.9)
Gamma Globulin: 1.3 g/dL (ref 0.4–1.8)
M-SPIKE, %: 1.1 g/dL — AB
SPEP Interpretation: 0
TOTAL PROTEIN ELP: 7.2 g/dL (ref 6.0–8.5)

## 2018-02-03 ENCOUNTER — Telehealth: Payer: Self-pay | Admitting: *Deleted

## 2018-02-03 NOTE — Telephone Encounter (Addendum)
Patient is aware of results  ----- Message from [name], MD sent at 02/03/2018  6:54 AM EDT ----- Call - myeloma studies are stable!!!  [name] [name]

## 2018-06-30 ENCOUNTER — Telehealth: Payer: Self-pay | Admitting: Hematology & Oncology

## 2018-06-30 ENCOUNTER — Inpatient Hospital Stay (HOSPITAL_BASED_OUTPATIENT_CLINIC_OR_DEPARTMENT_OTHER): Payer: Medicare Other | Admitting: Hematology & Oncology

## 2018-06-30 ENCOUNTER — Inpatient Hospital Stay: Payer: Medicare Other | Attending: Hematology & Oncology

## 2018-06-30 VITALS — BP 130/80 | HR 59 | Temp 98.0°F | Resp 18 | Wt 151.5 lb

## 2018-06-30 DIAGNOSIS — C9000 Multiple myeloma not having achieved remission: Secondary | ICD-10-CM

## 2018-06-30 DIAGNOSIS — D472 Monoclonal gammopathy: Secondary | ICD-10-CM

## 2018-06-30 LAB — CMP (CANCER CENTER ONLY)
ALT: 11 U/L (ref 0–44)
AST: 17 U/L (ref 15–41)
Albumin: 4.6 g/dL (ref 3.5–5.0)
Alkaline Phosphatase: 47 U/L (ref 38–126)
Anion gap: 6 (ref 5–15)
BILIRUBIN TOTAL: 0.4 mg/dL (ref 0.3–1.2)
BUN: 12 mg/dL (ref 8–23)
CO2: 31 mmol/L (ref 22–32)
CREATININE: 0.88 mg/dL (ref 0.61–1.24)
Calcium: 9.8 mg/dL (ref 8.9–10.3)
Chloride: 102 mmol/L (ref 98–111)
Glucose, Bld: 149 mg/dL — ABNORMAL HIGH (ref 70–99)
Potassium: 4.3 mmol/L (ref 3.5–5.1)
Sodium: 139 mmol/L (ref 135–145)
TOTAL PROTEIN: 7.7 g/dL (ref 6.5–8.1)

## 2018-06-30 LAB — CBC WITH DIFFERENTIAL (CANCER CENTER ONLY)
ABS IMMATURE GRANULOCYTES: 0.01 10*3/uL (ref 0.00–0.07)
BASOS PCT: 1 %
Basophils Absolute: 0 10*3/uL (ref 0.0–0.1)
EOS ABS: 0.1 10*3/uL (ref 0.0–0.5)
Eosinophils Relative: 2 %
HEMATOCRIT: 41.8 % (ref 39.0–52.0)
Hemoglobin: 13.6 g/dL (ref 13.0–17.0)
Immature Granulocytes: 0 %
LYMPHS ABS: 1.7 10*3/uL (ref 0.7–4.0)
Lymphocytes Relative: 30 %
MCH: 31.1 pg (ref 26.0–34.0)
MCHC: 32.5 g/dL (ref 30.0–36.0)
MCV: 95.4 fL (ref 80.0–100.0)
MONO ABS: 0.5 10*3/uL (ref 0.1–1.0)
MONOS PCT: 9 %
Neutro Abs: 3.5 10*3/uL (ref 1.7–7.7)
Neutrophils Relative %: 58 %
Platelet Count: 232 10*3/uL (ref 150–400)
RBC: 4.38 MIL/uL (ref 4.22–5.81)
RDW: 12.5 % (ref 11.5–15.5)
WBC Count: 5.9 10*3/uL (ref 4.0–10.5)
nRBC: 0 % (ref 0.0–0.2)

## 2018-06-30 NOTE — Progress Notes (Signed)
Hematology and Oncology Follow Up Visit  [name] [name] [id number] 05-11-1956 62 y.o. 06/30/2018   Principle Diagnosis:  Smoldering IgG Kappa Myeloma   Current Therapy:   Observation    Interim History:  [name] [name] is here today for follow-up.  It is been 6 months since we last saw him.  We backslash see him on 78-month intervals.  He had no problems at the end of 2019.  His mother did pass away back in December.  I know this was a little bit tough on the family.  He and his wife are going to be going to [place] for a wedding sometime in the spring.  They are looking forward to this.  I hope that there will not be any problems while he is down in [place] with respect to the locals and the gangs that are taking over.  He is not sleeping at all.  He thinks is from the fentanyl patch.  He is going talk to his pain doctor on Friday to see about having the fentanyl patch stopped and may be something else given to him that he can sleep with.  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  When we last saw him, his M spike was 1.1 g/dL.  His IgG level was 1004 6 1 mg/dL.  His kappa light chain was 10.9 mg/dL.  He has had no change in bowel or bladder habits.  Overall, his performance status is ECOG 1.  Medications:  Allergies as of 06/30/2018      Reactions   Aripiprazole    Other reaction(s): leg aching   Codeine    REACTION: hives   Duloxetine    Other reaction(s): ineffective   Fluoxetine    Other reaction(s): ineffective      Medication List       Accurate as of June 30, 2018 11:03 AM. Always use your most recent med list.        ALPRAZolam 0.5 MG tablet Commonly known as:  XANAX Take 0.5 mg by mouth as directed.   aspirin 81 MG tablet Take 1 tablet (81 mg total) by mouth daily.   clobetasol 0.05 % external solution Commonly known as:  TEMOVATE   clopidogrel 75 MG tablet Commonly known as:  PLAVIX Take 75 mg by mouth daily.   doxycycline 100 MG tablet Commonly known  as:  VIBRA-TABS 100 mg.   EPINEPHrine 0.3 mg/0.3 mL Soaj injection Commonly known as:  EPI-PEN   escitalopram 20 MG tablet Commonly known as:  LEXAPRO Take 20 mg by mouth daily.   fentaNYL 50 MCG/HR Commonly known as:  [place] 1 patch onto the skin every other day.   FLOMAX 0.4 MG Caps capsule Generic drug:  tamsulosin Take 0.4 mg by mouth daily.   gabapentin 300 MG capsule Commonly known as:  NEURONTIN TAKE 3 TABS BY MOUTH IN THE  AM & 4 TABS BY MOUTH IN THE PM   metFORMIN 500 MG tablet Commonly known as:  GLUCOPHAGE Take 1,000 mg by mouth 2 (two) times daily.   nitroGLYCERIN 0.4 MG SL tablet Commonly known as:  NITROSTAT Place 1 tablet (0.4 mg total) under the tongue every 5 (five) minutes as needed for chest pain.   oxyCODONE-acetaminophen 5-325 MG tablet Commonly known as:  PERCOCET/ROXICET Take 1 tablet by mouth every 4 (four) hours as needed for moderate pain or severe pain.   pantoprazole 40 MG tablet Commonly known as:  PROTONIX Take 1 tablet (40 mg total) by  mouth daily.   predniSONE 10 MG tablet Commonly known as:  DELTASONE 10 mg as needed. Tapered dosing when needed.   simvastatin 20 MG tablet Commonly known as:  ZOCOR Take 20 mg by mouth daily.   sitaGLIPtin 100 MG tablet Commonly known as:  JANUVIA Take 100 mg by mouth daily.   Suvorexant 15 MG Tabs Commonly known as:  BELSOMRA Take 15 mg by mouth at bedtime as needed.   zolpidem 10 MG tablet Commonly known as:  AMBIEN Take 10 mg by mouth at bedtime.       Allergies:  Allergies  Allergen Reactions  . Aripiprazole     Other reaction(s): leg aching  . Codeine     REACTION: hives  . Duloxetine     Other reaction(s): ineffective  . Fluoxetine     Other reaction(s): ineffective    Past Medical History, Surgical history, Social history, and Family History were reviewed and updated.  Review of Systems: Review of Systems  Constitutional: Negative.   HENT: Negative.   Eyes:  Negative.   Respiratory: Negative.   Cardiovascular: Negative.   Gastrointestinal: Negative.   Genitourinary: Negative.   Musculoskeletal: Positive for back pain and myalgias.  Skin: Positive for rash.  Neurological: Positive for sensory change.  Psychiatric/Behavioral: The patient is nervous/anxious.     Physical Exam:  weight is 151 lb 8 oz (68.7 kg). His oral temperature is 98 F (36.7 C). His blood pressure is 130/80 and his pulse is 59 (abnormal). His respiration is 18 and oxygen saturation is 100%.   Wt Readings from Last 3 Encounters:  06/30/18 151 lb 8 oz (68.7 kg)  01/28/18 153 lb 1.9 oz (69.5 kg)  09/29/17 154 lb 12.8 oz (70.2 kg)    Physical Exam Vitals signs reviewed.  HENT:     Head: Normocephalic and atraumatic.  Eyes:     Pupils: Pupils are equal, round, and reactive to light.  Neck:     Musculoskeletal: Normal range of motion.  Cardiovascular:     Rate and Rhythm: Normal rate and regular rhythm.     Heart sounds: Normal heart sounds.  Pulmonary:     Effort: Pulmonary effort is normal.     Breath sounds: Normal breath sounds.  Abdominal:     General: Bowel sounds are normal.     Palpations: Abdomen is soft.  Musculoskeletal: Normal range of motion.        General: No tenderness or deformity.  Lymphadenopathy:     Cervical: No cervical adenopathy.  Skin:    General: Skin is warm and dry.     Findings: No erythema or rash.  Neurological:     Mental Status: He is alert and oriented to person, place, and time.  Psychiatric:        Behavior: Behavior normal.        Thought Content: Thought content normal.        Judgment: Judgment normal.     Lab Results  Component Value Date   WBC 5.9 06/30/2018   HGB 13.6 06/30/2018   HCT 41.8 06/30/2018   MCV 95.4 06/30/2018   PLT 232 06/30/2018   Lab Results  Component Value Date   FERRITIN 73.9 11/20/2009   IRON 76 11/20/2009   IRONPCTSAT 22.8 11/20/2009   Lab Results  Component Value Date   RBC  4.38 06/30/2018   Lab Results  Component Value Date   KPAFRELGTCHN 109.1 (H) 01/28/2018   LAMBDASER 7.5 01/28/2018   KAPLAMBRATIO 14.55 (H)  01/28/2018   Lab Results  Component Value Date   IGGSERUM 1,461 01/28/2018   IGGSERUM 1,519 01/28/2018   IGA 79 01/28/2018   IGA 86 01/28/2018   IGMSERUM 18 (L) 01/28/2018   IGMSERUM 18 (L) 01/28/2018   Lab Results  Component Value Date   TOTALPROTELP 7.2 01/28/2018   ALBUMINELP 4.0 01/28/2018   A1GS 0.2 01/28/2018   A2GS 0.7 01/28/2018   BETS 0.9 01/28/2018   GAMS 1.3 01/28/2018   MSPIKE 1.1 (H) 01/28/2018     Chemistry      Component Value Date/Time   NA 139 06/30/2018 1018   NA 145 05/07/2017 0945   NA 139 07/25/2016 0815   K 4.3 06/30/2018 1018   K 3.9 05/07/2017 0945   K 4.0 07/25/2016 0815   CL 102 06/30/2018 1018   CL 100 05/07/2017 0945   CO2 31 06/30/2018 1018   CO2 29 05/07/2017 0945   CO2 28 07/25/2016 0815   BUN 12 06/30/2018 1018   BUN 8 05/07/2017 0945   BUN 9.0 07/25/2016 0815   CREATININE 0.88 06/30/2018 1018   CREATININE 0.8 05/07/2017 0945   CREATININE 0.8 07/25/2016 0815      Component Value Date/Time   CALCIUM 9.8 06/30/2018 1018   CALCIUM 9.7 05/07/2017 0945   CALCIUM 9.6 07/25/2016 0815   ALKPHOS 47 06/30/2018 1018   ALKPHOS 60 05/07/2017 0945   ALKPHOS 57 07/25/2016 0815   AST 17 06/30/2018 1018   AST 15 07/25/2016 0815   ALT 11 06/30/2018 1018   ALT 20 05/07/2017 0945   ALT 13 07/25/2016 0815   BILITOT 0.4 06/30/2018 1018   BILITOT 0.55 07/25/2016 0815     Impression and Plan: [name] [name] is a 63 yo white male with an IgG Kappa smoldering myeloma diagnosed 6 1/2 years ago. He was treated initially with Velcade and Decadron and then Revlimid. So far, he has done well. His last bone marrow test in March 2018 showed only 7-10% plasma cells.   He is doing pretty well from my point of view.  I would be surprised if his myeloma numbers were much higher.  For right now, we will get him back in 6  months.    1/28/202011:03 AM

## 2018-06-30 NOTE — Telephone Encounter (Signed)
[facility] APPT PER 1/28 LOS AND MAILED APPT LETTER

## 2018-07-01 LAB — KAPPA/LAMBDA LIGHT CHAINS
Kappa free light chain: 108.1 mg/L — ABNORMAL HIGH (ref 3.3–19.4)
Kappa, lambda light chain ratio: 14.22 — ABNORMAL HIGH (ref 0.26–1.65)
Lambda free light chains: 7.6 mg/L (ref 5.7–26.3)

## 2018-07-01 LAB — IGG, IGA, IGM
IGA: 94 mg/dL (ref 61–437)
IGG (IMMUNOGLOBIN G), SERUM: 1661 mg/dL — AB (ref 700–1600)
IgM (Immunoglobulin M), Srm: 19 mg/dL — ABNORMAL LOW (ref 20–172)

## 2018-07-03 ENCOUNTER — Telehealth: Payer: Self-pay | Admitting: *Deleted

## 2018-07-03 LAB — PROTEIN ELECTROPHORESIS, SERUM, WITH REFLEX
A/G Ratio: 1.3 (ref 0.7–1.7)
Albumin ELP: 4 g/dL (ref 2.9–4.4)
Alpha-1-Globulin: 0.2 g/dL (ref 0.0–0.4)
Alpha-2-Globulin: 0.7 g/dL (ref 0.4–1.0)
Beta Globulin: 0.9 g/dL (ref 0.7–1.3)
Gamma Globulin: 1.4 g/dL (ref 0.4–1.8)
Globulin, Total: 3.2 g/dL (ref 2.2–3.9)
M-Spike, %: 1.2 g/dL — ABNORMAL HIGH
SPEP Interpretation: 0
Total Protein ELP: 7.2 g/dL (ref 6.0–8.5)

## 2018-07-03 LAB — IMMUNOFIXATION REFLEX, SERUM
IGA: 106 mg/dL (ref 61–437)
IgG (Immunoglobin G), Serum: 1941 mg/dL — ABNORMAL HIGH (ref 700–1600)
IgM (Immunoglobulin M), Srm: 20 mg/dL (ref 20–172)

## 2018-07-03 NOTE — Telephone Encounter (Signed)
-----   Message from [name], MD sent at 07/03/2018  3:13 PM EST ----- Call - Myeloma protein is stable at 1.2.   [name] [name]

## 2018-07-03 NOTE — Telephone Encounter (Signed)
As noted below by Dr. [name], I informed patient that his Myeloma protein is stable at 1.2. He verbalized understanding.

## 2018-11-13 ENCOUNTER — Telehealth: Payer: Self-pay | Admitting: *Deleted

## 2018-11-13 NOTE — Telephone Encounter (Signed)
Call placed to pt re: surgical clearance and the need for an appt.  Left pt a message for him to call back.

## 2018-11-13 NOTE — Telephone Encounter (Signed)
   Primary Cardiologist:Peter Johnsie Cancel, MD  Chart reviewed as part of pre-operative protocol coverage. Because of BAXTER GONZALEZ past medical history and time since last visit, he/she will require a follow-up visit in order to better assess preoperative cardiovascular risk.  Last OV and EKG were over 3 years ago so this needs to be in person visit, likely with MD as new patient.  Pre-op covering staff: - Please schedule appointment and call patient to inform them. - Please contact requesting surgeon's office via preferred method (i.e, phone, fax) to inform them of need for appointment prior to surgery.  Per office protocol, the provider should assess clearance at time of office visit and should forward their finalized clearance decision to requesting party below. I will remove this message from the pre-op box.  Charlie Pitter, PA-C  11/13/2018, 12:35 PM

## 2018-11-13 NOTE — Telephone Encounter (Signed)
   [facility] Pre-operative Risk Assessment    Request for surgical clearance:  1. What type of surgery is being performed? COLONOSCOPY  2. When is this surgery scheduled? 12/07/18  3. What type of clearance is required (medical clearance vs. Pharmacy clearance to hold med vs. Both)? MEDICAL  4. Are there any medications that need to be held prior to surgery and how long? PLAVIX  5. Practice name and name of physician performing surgery? [facility]; DR. MAGOD   6. What is your office phone number [phone]   7.   What is your office fax number [phone]  8.   Anesthesia type (None, local, MAC, general) ? PROPOFOL ? NOT LISTED   [name] [name] 11/13/2018, 10:09 AM  _________________________________________________________________   (provider comments below)

## 2018-11-17 NOTE — Telephone Encounter (Signed)
Called patient and made him an appointment in September.

## 2018-11-17 NOTE — Telephone Encounter (Signed)
Pt contacted re: surgical clearance and the need for an appt. Per pt, he is going to cancel the Colonoscopy due to how Covid is going right now and he don't think it is smart for him to have it done at this time, so pt didn't want to schedule at this time.  Pt would like to get back in to see Dr. Johnsie Cancel for a regular follow-up.  He is advised I would send his RN, Jeannene Patella, a message for her to call him and see where she can fit him in, as he hasn't been seen since 06/2015.  Will also fwd this back to the requesting surgeon's office to make them aware.

## 2018-12-29 ENCOUNTER — Other Ambulatory Visit: Payer: Self-pay

## 2018-12-29 ENCOUNTER — Inpatient Hospital Stay: Payer: Medicare Other | Attending: Hematology & Oncology | Admitting: Hematology & Oncology

## 2018-12-29 ENCOUNTER — Inpatient Hospital Stay: Payer: Medicare Other

## 2018-12-29 ENCOUNTER — Other Ambulatory Visit: Payer: Self-pay | Admitting: Orthopaedic Surgery

## 2018-12-29 VITALS — BP 124/78 | HR 67 | Temp 97.1°F | Resp 18 | Wt 153.0 lb

## 2018-12-29 DIAGNOSIS — M545 Low back pain, unspecified: Secondary | ICD-10-CM

## 2018-12-29 DIAGNOSIS — C9000 Multiple myeloma not having achieved remission: Secondary | ICD-10-CM

## 2018-12-29 DIAGNOSIS — D472 Monoclonal gammopathy: Secondary | ICD-10-CM

## 2018-12-29 LAB — CBC WITH DIFFERENTIAL (CANCER CENTER ONLY)
Abs Immature Granulocytes: 0.01 10*3/uL (ref 0.00–0.07)
Basophils Absolute: 0 10*3/uL (ref 0.0–0.1)
Basophils Relative: 1 %
Eosinophils Absolute: 0.1 10*3/uL (ref 0.0–0.5)
Eosinophils Relative: 2 %
HCT: 41.4 % (ref 39.0–52.0)
Hemoglobin: 13.6 g/dL (ref 13.0–17.0)
Immature Granulocytes: 0 %
Lymphocytes Relative: 31 %
Lymphs Abs: 1.6 10*3/uL (ref 0.7–4.0)
MCH: 31.3 pg (ref 26.0–34.0)
MCHC: 32.9 g/dL (ref 30.0–36.0)
MCV: 95.2 fL (ref 80.0–100.0)
Monocytes Absolute: 0.6 10*3/uL (ref 0.1–1.0)
Monocytes Relative: 11 %
Neutro Abs: 3 10*3/uL (ref 1.7–7.7)
Neutrophils Relative %: 55 %
Platelet Count: 218 10*3/uL (ref 150–400)
RBC: 4.35 MIL/uL (ref 4.22–5.81)
RDW: 12.2 % (ref 11.5–15.5)
WBC Count: 5.3 10*3/uL (ref 4.0–10.5)
nRBC: 0 % (ref 0.0–0.2)

## 2018-12-29 LAB — CMP (CANCER CENTER ONLY)
ALT: 14 U/L (ref 0–44)
AST: 18 U/L (ref 15–41)
Albumin: 4.4 g/dL (ref 3.5–5.0)
Alkaline Phosphatase: 50 U/L (ref 38–126)
Anion gap: 7 (ref 5–15)
BUN: 9 mg/dL (ref 8–23)
CO2: 31 mmol/L (ref 22–32)
Calcium: 8.9 mg/dL (ref 8.9–10.3)
Chloride: 101 mmol/L (ref 98–111)
Creatinine: 0.74 mg/dL (ref 0.61–1.24)
GFR, Est AFR Am: >60 mL/min (ref >60)
GFR, Estimated: >60 mL/min (ref >60)
Glucose, Bld: 92 mg/dL (ref 70–99)
Potassium: 3.9 mmol/L (ref 3.5–5.1)
Sodium: 139 mmol/L (ref 135–145)
Total Bilirubin: 0.5 mg/dL (ref 0.3–1.2)
Total Protein: 7.5 g/dL (ref 6.5–8.1)

## 2018-12-29 LAB — LACTATE DEHYDROGENASE: LDH: 130 U/L (ref 98–192)

## 2018-12-29 NOTE — Progress Notes (Signed)
Hematology and Oncology Follow Up Visit  [name] [name] [id number] [birth year] 63 y.o. 12/29/2018   Principle Diagnosis:  Smoldering IgG Kappa Myeloma   Current Therapy:   Observation    Interim History:  [name] [name] is here today for follow-up.  It is been 6 months since we last saw him.    He is doing okay although pain wise he is having some problems.  He is not too keen on the fentanyl patch.  He actually states his pain doctor tomorrow.  His appetite is not that great.  His weight is holding steady however.  I told him to try some CBD oil.  This might help with the appetite.  His M spike has been holding steady.  When we last saw him in January the M spike was 1.2 g/dL.  His IgG level was 1661 mg/dL.  His Kappa light chain was 11 mg/dL.  He has had no change in bowel or bladder habits.  Overall, his performance status is ECOG 1.  Medications:  Allergies as of 12/29/2018      Reactions   Aripiprazole    Other reaction(s): leg aching   Codeine    REACTION: hives   Duloxetine    Other reaction(s): ineffective   Fluoxetine    Other reaction(s): ineffective      Medication List       Accurate as of December 29, 2018 11:54 AM. If you have any questions, ask your nurse or doctor.        ALPRAZolam 0.5 MG tablet Commonly known as: XANAX Take 0.5 mg by mouth as directed.   aspirin 81 MG tablet Take 1 tablet (81 mg total) by mouth daily.   clobetasol 0.05 % external solution Commonly known as: TEMOVATE   clopidogrel 75 MG tablet Commonly known as: PLAVIX Take 75 mg by mouth daily.   doxycycline 100 MG tablet Commonly known as: VIBRA-TABS 100 mg.   EPINEPHrine 0.3 mg/0.3 mL Soaj injection Commonly known as: EPI-PEN   escitalopram 20 MG tablet Commonly known as: LEXAPRO Take 20 mg by mouth daily.   fentaNYL 50 MCG/HR Commonly known as: [place] 1 patch onto the skin every other day.   Flomax 0.4 MG Caps capsule Generic drug: tamsulosin Take 0.4 mg  by mouth daily.   gabapentin 300 MG capsule Commonly known as: NEURONTIN TAKE 3 TABS BY MOUTH IN THE  AM & 4 TABS BY MOUTH IN THE PM   metFORMIN 500 MG tablet Commonly known as: GLUCOPHAGE Take 1,000 mg by mouth 2 (two) times daily.   nitroGLYCERIN 0.4 MG SL tablet Commonly known as: NITROSTAT Place 1 tablet (0.4 mg total) under the tongue every 5 (five) minutes as needed for chest pain.   oxyCODONE-acetaminophen 5-325 MG tablet Commonly known as: PERCOCET/ROXICET Take 1 tablet by mouth every 4 (four) hours as needed for moderate pain or severe pain.   pantoprazole 40 MG tablet Commonly known as: PROTONIX Take 1 tablet (40 mg total) by mouth daily.   predniSONE 10 MG tablet Commonly known as: DELTASONE 10 mg as needed. Tapered dosing when needed.   simvastatin 20 MG tablet Commonly known as: ZOCOR Take 20 mg by mouth daily.   sitaGLIPtin 100 MG tablet Commonly known as: JANUVIA Take 100 mg by mouth daily.   Suvorexant 15 MG Tabs Commonly known as: Belsomra Take 15 mg by mouth at bedtime as needed.   zolpidem 10 MG tablet Commonly known as: AMBIEN Take 10 mg by mouth at bedtime.  Allergies:  Allergies  Allergen Reactions  . Aripiprazole     Other reaction(s): leg aching  . Codeine     REACTION: hives  . Duloxetine     Other reaction(s): ineffective  . Fluoxetine     Other reaction(s): ineffective    Past Medical History, Surgical history, Social history, and Family History were reviewed and updated.  Review of Systems: Review of Systems  Constitutional: Negative.   HENT: Negative.   Eyes: Negative.   Respiratory: Negative.   Cardiovascular: Negative.   Gastrointestinal: Negative.   Genitourinary: Negative.   Musculoskeletal: Positive for back pain and myalgias.  Skin: Positive for rash.  Neurological: Positive for sensory change.  Psychiatric/Behavioral: The patient is nervous/anxious.     Physical Exam:  weight is 153 lb (69.4 kg). His  temporal temperature is 97.1 F (36.2 C) (abnormal). His blood pressure is 124/78 and his pulse is 67. His respiration is 18 and oxygen saturation is 100%.   Wt Readings from Last 3 Encounters:  12/29/18 153 lb (69.4 kg)  06/30/18 151 lb 8 oz (68.7 kg)  01/28/18 153 lb 1.9 oz (69.5 kg)    Physical Exam Vitals signs reviewed.  HENT:     Head: Normocephalic and atraumatic.  Eyes:     Pupils: Pupils are equal, round, and reactive to light.  Neck:     Musculoskeletal: Normal range of motion.  Cardiovascular:     Rate and Rhythm: Normal rate and regular rhythm.     Heart sounds: Normal heart sounds.  Pulmonary:     Effort: Pulmonary effort is normal.     Breath sounds: Normal breath sounds.  Abdominal:     General: Bowel sounds are normal.     Palpations: Abdomen is soft.  Musculoskeletal: Normal range of motion.        General: No tenderness or deformity.  Lymphadenopathy:     Cervical: No cervical adenopathy.  Skin:    General: Skin is warm and dry.     Findings: No erythema or rash.  Neurological:     Mental Status: He is alert and oriented to person, place, and time.  Psychiatric:        Behavior: Behavior normal.        Thought Content: Thought content normal.        Judgment: Judgment normal.     Lab Results  Component Value Date   WBC 5.3 12/29/2018   HGB 13.6 12/29/2018   HCT 41.4 12/29/2018   MCV 95.2 12/29/2018   PLT 218 12/29/2018   Lab Results  Component Value Date   FERRITIN 73.9 11/20/2009   IRON 76 11/20/2009   IRONPCTSAT 22.8 11/20/2009   Lab Results  Component Value Date   RBC 4.35 12/29/2018   Lab Results  Component Value Date   KPAFRELGTCHN 108.1 (H) 06/30/2018   LAMBDASER 7.6 06/30/2018   KAPLAMBRATIO 14.22 (H) 06/30/2018   Lab Results  Component Value Date   IGGSERUM 1,941 (H) 06/30/2018   IGA 106 06/30/2018   IGMSERUM 20 06/30/2018   Lab Results  Component Value Date   TOTALPROTELP 7.2 06/30/2018   ALBUMINELP 4.0 06/30/2018    A1GS 0.2 06/30/2018   A2GS 0.7 06/30/2018   BETS 0.9 06/30/2018   GAMS 1.4 06/30/2018   MSPIKE 1.2 (H) 06/30/2018     Chemistry      Component Value Date/Time   NA 139 12/29/2018 1032   NA 145 05/07/2017 0945   NA 139 07/25/2016 0815   K  3.9 12/29/2018 1032   K 3.9 05/07/2017 0945   K 4.0 07/25/2016 0815   CL 101 12/29/2018 1032   CL 100 05/07/2017 0945   CO2 31 12/29/2018 1032   CO2 29 05/07/2017 0945   CO2 28 07/25/2016 0815   BUN 9 12/29/2018 1032   BUN 8 05/07/2017 0945   BUN 9.0 07/25/2016 0815   CREATININE 0.74 12/29/2018 1032   CREATININE 0.8 05/07/2017 0945   CREATININE 0.8 07/25/2016 0815      Component Value Date/Time   CALCIUM 8.9 12/29/2018 1032   CALCIUM 9.7 05/07/2017 0945   CALCIUM 9.6 07/25/2016 0815   ALKPHOS 50 12/29/2018 1032   ALKPHOS 60 05/07/2017 0945   ALKPHOS 57 07/25/2016 0815   AST 18 12/29/2018 1032   AST 15 07/25/2016 0815   ALT 14 12/29/2018 1032   ALT 20 05/07/2017 0945   ALT 13 07/25/2016 0815   BILITOT 0.5 12/29/2018 1032   BILITOT 0.55 07/25/2016 0815     Impression and Plan: Mr. Helfman is a 63 yo white male with an IgG Kappa smoldering myeloma diagnosed 6 1/2 years ago. He was treated initially with Velcade and Decadron and then Revlimid. So far, he has done well. His last bone marrow test in March 2018 showed only 7-10% plasma cells.   He is doing pretty well from my point of view.  I would be surprised if his myeloma numbers were much higher.  For right now, we will get him back in 6 months.    7/28/202011:54 AM

## 2018-12-30 LAB — KAPPA/LAMBDA LIGHT CHAINS
Kappa free light chain: 124.1 mg/L — ABNORMAL HIGH (ref 3.3–19.4)
Kappa, lambda light chain ratio: 8.5 — ABNORMAL HIGH (ref 0.26–1.65)
Lambda free light chains: 14.6 mg/L (ref 5.7–26.3)

## 2018-12-30 LAB — IGG, IGA, IGM
IgA: 86 mg/dL (ref 61–437)
IgG (Immunoglobin G), Serum: 1752 mg/dL — ABNORMAL HIGH (ref 603–1613)
IgM (Immunoglobulin M), Srm: 16 mg/dL — ABNORMAL LOW (ref 20–172)

## 2018-12-31 LAB — PROTEIN ELECTROPHORESIS, SERUM, WITH REFLEX
A/G Ratio: 1.3 (ref 0.7–1.7)
Albumin ELP: 4.1 g/dL (ref 2.9–4.4)
Alpha-1-Globulin: 0.2 g/dL (ref 0.0–0.4)
Alpha-2-Globulin: 0.7 g/dL (ref 0.4–1.0)
Beta Globulin: 0.8 g/dL (ref 0.7–1.3)
Gamma Globulin: 1.4 g/dL (ref 0.4–1.8)
Globulin, Total: 3.2 g/dL (ref 2.2–3.9)
M-Spike, %: 1.1 g/dL — ABNORMAL HIGH
SPEP Interpretation: 0
Total Protein ELP: 7.3 g/dL (ref 6.0–8.5)

## 2018-12-31 LAB — IMMUNOFIXATION REFLEX, SERUM
IgA: 88 mg/dL (ref 61–437)
IgG (Immunoglobin G), Serum: 1740 mg/dL — ABNORMAL HIGH (ref 603–1613)
IgM (Immunoglobulin M), Srm: 18 mg/dL — ABNORMAL LOW (ref 20–172)

## 2019-01-21 NOTE — Progress Notes (Signed)
CARDIOLOGY CONSULT NOTE       Patient ID: [name] [name] MRN: [id number] DOB/AGE: 1955/11/03 63 y.o.  Primary Physician: Mayra Neer, MD Primary Cardiologist: Iris Pert Reason for Consultation: CAD  Active Problems:   * No active hospital problems. *   HPI:  63 y.o. last seen 2017 History of CAD, HTN, HLD Distant history of stenting circumflex and IM branches Actively being RX for myeloma by Dr [name]. Sees Dr [name] for COPD Diabetes somewhat poorly controlled Last myovue 2015 non ischemic EF has been normal in past He is due to have colonoscopy after COVID restrictions calm down  Active no agnina His mother who I also cared for died last year   ROS All other systems reviewed and negative except as noted above  Past Medical History:  Diagnosis Date   CAD (coronary artery disease)    stents '99 and '05; patent circ and IM stents cath. Brantley Fling 10/08 non ischemic   Chronic back pain    Depression    DM (diabetes mellitus) ([place])    GERD (gastroesophageal reflux disease)    HLD (hyperlipidemia)    Insomnia    Multiple myeloma    dx 11/11. followed by Dr. [name] in [place]   OSA (obstructive sleep apnea)    AHI 5 from home sleep test 10/05/09   Peripheral neuropathy    Prostatism    RLS (restless legs syndrome)    responded to iron supplementation    Family History  Problem Relation Age of Onset   Heart disease Father    Diabetes Father    Hypertension Mother     Social History   Socioeconomic History   Marital status: Married    Spouse name: Not on file   Number of children: Not on file   Years of education: Not on file   Highest education level: Not on file  Occupational History   Not on file  Social Needs   Financial resource strain: Not on file   Food insecurity    Worry: Not on file    Inability: Not on file   Transportation needs    Medical: Not on file    Non-medical: Not on file  Tobacco Use   Smoking status: Former  Smoker   Smokeless tobacco: Never Used   Tobacco comment: no intake of tobacco produtcs; does chew nicotene gum   Substance and Sexual Activity   Alcohol use: No   Drug use: Not on file   Sexual activity: Not on file  Lifestyle   Physical activity    Days per week: Not on file    Minutes per session: Not on file   Stress: Not on file  Relationships   Social connections    Talks on phone: Not on file    Gets together: Not on file    Attends religious service: Not on file    Active member of club or organization: Not on file    Attends meetings of clubs or organizations: Not on file    Relationship status: Not on file   Intimate partner violence    Fear of current or ex partner: Not on file    Emotionally abused: Not on file    Physically abused: Not on file    Forced sexual activity: Not on file  Other Topics Concern   Not on file  Social History Narrative   Married, no children; business owner; 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     Past Surgical  History:  Procedure Laterality Date   IR REMOVAL TUN ACCESS W/ PORT W/O FL MOD SED  12/11/2016   IR TRANSCATH RETRIEVAL FB INCL GUIDANCE (MS)  12/11/2016   IR US GUIDE VASC ACCESS RIGHT  12/11/2016   L4-5 epidural steriod injection     with fluoroscopic guidance   release of rt long finger A1     pulley with debridement of tenosynovitis   stent placed     95% lesion in AV circumflex that was dilated and stented with an AVE stent. also had 60% lesion in the OM-I and minor disease in LAD and RCA 02/14/98. Dr Velora Heckler cc Dr. Percival Spanish         Physical Exam: Blood pressure 100/66, pulse 76, height 6' (1.829 m), weight 158 lb (71.7 kg), SpO2 98 %.    Affect appropriate Healthy:  appears stated age 28: normal Neck supple with no adenopathy JVP normal no bruits no thyromegaly Lungs clear with no wheezing and good diaphragmatic motion Heart:  S1/S2 no murmur, no rub, gallop or click PMI normal Abdomen:  benighn, BS positve, no tenderness, no AAA no bruit.  No HSM or HJR Distal pulses intact with no bruits No edema Neuro non-focal Skin warm and dry No muscular weakness   Labs:   Lab Results  Component Value Date   WBC 5.3 12/29/2018   HGB 13.6 12/29/2018   HCT 41.4 12/29/2018   MCV 95.2 12/29/2018   PLT 218 12/29/2018   No results for input(s): NA, K, CL, CO2, BUN, CREATININE, CALCIUM, PROT, BILITOT, ALKPHOS, ALT, AST, GLUCOSE in the last 168 hours.  Invalid input(s): LABALBU No results found for: CKTOTAL, CKMB, CKMBINDEX, TROPONINI No results found for: CHOL No results found for: HDL No results found for: LDLCALC No results found for: TRIG No results found for: CHOLHDL No results found for: LDLDIRECT    Radiology: Mr Lumbar Spine W Wo Contrast  Result Date: 01/23/2019 CLINICAL DATA:  Low back pain and bilateral hip pain for 15 years. Prior history of lumbar fusion. EXAM: MRI LUMBAR SPINE WITHOUT AND WITH CONTRAST TECHNIQUE: Multiplanar and multiecho pulse sequences of the lumbar spine were obtained without and with intravenous contrast. CONTRAST:  52m MULTIHANCE GADOBENATE DIMEGLUMINE 529 MG/ML IV SOLN COMPARISON:  None. FINDINGS: Segmentation:  Standard. Alignment:  Physiologic. Vertebrae: No fracture, evidence of discitis, or bone lesion. Schmorl's node along the superior endplate of TV49 L1, L2, L3 and L4. Conus medullaris and cauda equina: Conus extends to the L1 level. Conus and cauda equina appear normal. Paraspinal and other soft tissues: No acute paraspinal abnormality. Postsurgical changes in the posterior paraspinal soft tissues from L4 through S1. Disc levels: Disc spaces: Degenerative disease with disc height loss at L2-3. Posterior lumbar interbody fusion from L4 through S1 with solid osseous fusion. Degenerative disease with disc height loss at T11-12. T11-12: Mild broad-based disc bulge. No foraminal or central canal stenosis. T12-L1: No significant disc bulge. No  evidence of neural foraminal stenosis. No central canal stenosis. L1-L2: No significant disc bulge. No evidence of neural foraminal stenosis. No central canal stenosis. L2-L3: Mild broad-based disc bulge. Mild bilateral facet arthropathy. No evidence of neural foraminal stenosis. No central canal stenosis. L3-L4: Mild broad-based disc bulge. Mild bilateral facet arthropathy. No evidence of neural foraminal stenosis. No central canal stenosis. L4-L5: Interbody fusion. No evidence of neural foraminal stenosis. No central canal stenosis. L5-S1: Interbody fusion. No evidence of neural foraminal stenosis. No central canal stenosis. IMPRESSION: 1. Posterior lumbar interbody fusion from  L4 through S1 without recurrent foraminal or central canal stenosis. 2. At L2-3 and L3-4 there is minimal broad-based disc bulge and mild bilateral facet arthropathy. Electronically Signed   By: Kathreen Devoid   On: 01/23/2019 18:04    EKG: SR rate 72 PAC 1/20/179  02/02/19 SR rate 76 PAC otherwise normal    ASSESSMENT AND PLAN:   1. CAD:  Distant history of circumflex/IM stents. Last non ischemic myovue 2015 consider repeating next year Active with no chest pain currently  2.  DM:  Discussed low carb diet.  Target hemoglobin A1c is 6.5 or less.  Continue current medications. 3.  HTN:  Well controlled.  Continue current medications and low sodium Dash type diet.   4.  HLD:  Continue statin labs with primary 5.  Myeloma :  F/u Ennever PET scan benign 10/16/17   Signed: Jenkins Rouge 02/02/2019, 3:57 PM

## 2019-01-23 ENCOUNTER — Ambulatory Visit
Admission: RE | Admit: 2019-01-23 | Discharge: 2019-01-23 | Disposition: A | Discharge status: Home / Self Care | Payer: Medicare Other | Source: Ambulatory Visit | Attending: Orthopaedic Surgery | Admitting: Orthopaedic Surgery

## 2019-01-23 ENCOUNTER — Other Ambulatory Visit: Payer: Self-pay

## 2019-01-23 DIAGNOSIS — M545 Low back pain, unspecified: Secondary | ICD-10-CM

## 2019-01-23 MED ORDER — GADOBENATE DIMEGLUMINE 529 MG/ML IV SOLN
14.0000 mL | Freq: Once | INTRAVENOUS | Status: AC | PRN
  Administered 2019-01-23: 14 mL via INTRAVENOUS

## 2019-02-02 ENCOUNTER — Encounter: Payer: Self-pay | Admitting: Cardiovascular Disease

## 2019-02-02 ENCOUNTER — Ambulatory Visit (INDEPENDENT_AMBULATORY_CARE_PROVIDER_SITE_OTHER): Payer: Medicare Other | Admitting: Cardiovascular Disease

## 2019-02-02 ENCOUNTER — Other Ambulatory Visit: Payer: Self-pay

## 2019-02-02 VITALS — BP 100/66 | HR 76 | Ht 72.0 in | Wt 158.0 lb

## 2019-02-02 DIAGNOSIS — I2510 Atherosclerotic heart disease of native coronary artery without angina pectoris: Secondary | ICD-10-CM

## 2019-02-02 DIAGNOSIS — I10 Essential (primary) hypertension: Secondary | ICD-10-CM

## 2019-02-02 MED ORDER — NITROGLYCERIN 0.4 MG SL SUBL: 0.4000 mg | SUBLINGUAL_TABLET | SUBLINGUAL | 3 refills | Status: DC | PRN

## 2019-02-02 NOTE — Patient Instructions (Addendum)
Your physician recommends that you continue on your current medications as directed. Please refer to the Current Medication list given to you today.  Your physician wants you to follow-up in: YEAR WITH DR NISHAN You will receive a reminder letter in the mail two months in advance. If you don't receive a letter, please call our office to schedule the follow-up appointment.  

## 2019-02-10 ENCOUNTER — Other Ambulatory Visit: Payer: Self-pay | Admitting: Orthopaedic Surgery

## 2019-02-10 DIAGNOSIS — R102 Pelvic and perineal pain: Secondary | ICD-10-CM

## 2019-02-11 ENCOUNTER — Other Ambulatory Visit: Payer: Self-pay

## 2019-02-11 ENCOUNTER — Ambulatory Visit
Admission: RE | Admit: 2019-02-11 | Discharge: 2019-02-11 | Disposition: A | Discharge status: Home / Self Care | Payer: Medicare Other | Source: Ambulatory Visit | Attending: Orthopaedic Surgery | Admitting: Orthopaedic Surgery

## 2019-02-11 DIAGNOSIS — R102 Pelvic and perineal pain: Secondary | ICD-10-CM

## 2019-02-25 ENCOUNTER — Telehealth: Payer: Self-pay | Admitting: Hematology & Oncology

## 2019-02-25 NOTE — Telephone Encounter (Signed)
Faxed medical records to: [facility] F: [place] 08-02-1955      COPY SCANNED

## 2019-07-01 ENCOUNTER — Telehealth: Payer: Self-pay | Admitting: Hematology & Oncology

## 2019-07-01 ENCOUNTER — Inpatient Hospital Stay: Payer: Medicare Other

## 2019-07-01 ENCOUNTER — Encounter: Payer: Self-pay | Admitting: Hematology & Oncology

## 2019-07-01 ENCOUNTER — Inpatient Hospital Stay: Payer: Medicare Other | Attending: Hematology & Oncology | Admitting: Hematology & Oncology

## 2019-07-01 ENCOUNTER — Other Ambulatory Visit: Payer: Self-pay

## 2019-07-01 VITALS — BP 121/86 | HR 67 | Temp 98.6°F | Resp 18 | Wt 148.5 lb

## 2019-07-01 DIAGNOSIS — C9000 Multiple myeloma not having achieved remission: Secondary | ICD-10-CM

## 2019-07-01 DIAGNOSIS — R52 Pain, unspecified: Secondary | ICD-10-CM | POA: Insufficient documentation

## 2019-07-01 DIAGNOSIS — D472 Monoclonal gammopathy: Secondary | ICD-10-CM

## 2019-07-01 LAB — CMP (CANCER CENTER ONLY)
ALT: 12 U/L (ref 0–44)
AST: 14 U/L — ABNORMAL LOW (ref 15–41)
Albumin: 4.6 g/dL (ref 3.5–5.0)
Alkaline Phosphatase: 59 U/L (ref 38–126)
Anion gap: 7 (ref 5–15)
BUN: 16 mg/dL (ref 8–23)
CO2: 31 mmol/L (ref 22–32)
Calcium: 9.7 mg/dL (ref 8.9–10.3)
Chloride: 102 mmol/L (ref 98–111)
Creatinine: 0.88 mg/dL (ref 0.61–1.24)
GFR, Est AFR Am: >60 mL/min (ref >60)
GFR, Estimated: >60 mL/min (ref >60)
Glucose, Bld: 149 mg/dL — ABNORMAL HIGH (ref 70–99)
Potassium: 4 mmol/L (ref 3.5–5.1)
Sodium: 140 mmol/L (ref 135–145)
Total Bilirubin: 0.5 mg/dL (ref 0.3–1.2)
Total Protein: 7.8 g/dL (ref 6.5–8.1)

## 2019-07-01 LAB — CBC WITH DIFFERENTIAL (CANCER CENTER ONLY)
Abs Immature Granulocytes: 0.04 10*3/uL (ref 0.00–0.07)
Basophils Absolute: 0.1 10*3/uL (ref 0.0–0.1)
Basophils Relative: 1 %
Eosinophils Absolute: 0.1 10*3/uL (ref 0.0–0.5)
Eosinophils Relative: 1 %
HCT: 43.3 % (ref 39.0–52.0)
Hemoglobin: 14.1 g/dL (ref 13.0–17.0)
Immature Granulocytes: 1 %
Lymphocytes Relative: 15 %
Lymphs Abs: 1.3 10*3/uL (ref 0.7–4.0)
MCH: 30.8 pg (ref 26.0–34.0)
MCHC: 32.6 g/dL (ref 30.0–36.0)
MCV: 94.5 fL (ref 80.0–100.0)
Monocytes Absolute: 0.6 10*3/uL (ref 0.1–1.0)
Monocytes Relative: 7 %
Neutro Abs: 6.6 10*3/uL (ref 1.7–7.7)
Neutrophils Relative %: 75 %
Platelet Count: 251 10*3/uL (ref 150–400)
RBC: 4.58 MIL/uL (ref 4.22–5.81)
RDW: 12.6 % (ref 11.5–15.5)
WBC Count: 8.8 10*3/uL (ref 4.0–10.5)
nRBC: 0 % (ref 0.0–0.2)

## 2019-07-01 NOTE — Telephone Encounter (Signed)
Called and advised patient of appointments added per 1/28 los 

## 2019-07-01 NOTE — Progress Notes (Signed)
Hematology and Oncology Follow Up Visit  JAX KAWAMURA [id number] December 06, 1955 65 y.o. 07/01/2019   Principle Diagnosis:  Smoldering IgG Kappa Myeloma   Current Therapy:   Observation    Interim History:  Mr. Urgiles is here today for follow-up.  It is been 6 months since we last saw him.    He is doing okay although pain wise he is having some problems.  He is not too keen on the fentanyl patch.  He actually states his pain doctor tomorrow.  His appetite is not that great.  His weight is holding steady however.  I told him to try some CBD oil.  This might help with the appetite.  His M spike has been holding steady.  When we last saw him in January the M spike was 1.1 g/dL.  His IgG level was 1740 mg/dL.  His Kappa light chain was 12.41 mg/dL.  He has had no change in bowel or bladder habits.  Overall, his performance status is ECOG 1.  Medications:  Allergies as of 07/01/2019      Reactions   Aripiprazole    Other reaction(s): leg aching   Codeine    REACTION: hives   Duloxetine    Other reaction(s): ineffective   Fluoxetine    Other reaction(s): ineffective      Medication List       Accurate as of July 01, 2019 12:40 PM. If you have any questions, ask your nurse or doctor.        ALPRAZolam 0.5 MG tablet Commonly known as: XANAX Take 0.5 mg by mouth as directed.   aspirin 81 MG tablet Take 1 tablet (81 mg total) by mouth daily.   clopidogrel 75 MG tablet Commonly known as: PLAVIX Take 75 mg by mouth daily.   doxycycline 100 MG tablet Commonly known as: VIBRA-TABS Take 100 mg by mouth 2 (two) times daily.   EPINEPHrine 0.3 mg/0.3 mL Soaj injection Commonly known as: EPI-PEN   escitalopram 20 MG tablet Commonly known as: LEXAPRO Take 20 mg by mouth daily.   fentaNYL 50 MCG/HR Commonly known as: [place] 1 patch onto the skin every other day.   fentaNYL 37.5 MCG/HR Pt72 Apply 1 patch topically every other day.   Flomax 0.4 MG Caps  capsule Generic drug: tamsulosin Take 0.4 mg by mouth daily.   gabapentin 300 MG capsule Commonly known as: NEURONTIN TAKE 3 TABS BY MOUTH IN THE  AM & 4 TABS BY MOUTH IN THE PM   metFORMIN 500 MG tablet Commonly known as: GLUCOPHAGE Take 1,000 mg by mouth 2 (two) times daily.   methocarbamol 750 MG tablet Commonly known as: ROBAXIN Take 750 mg by mouth 3 (three) times daily as needed.   nitroGLYCERIN 0.4 MG SL tablet Commonly known as: NITROSTAT Place 1 tablet (0.4 mg total) under the tongue every 5 (five) minutes as needed for chest pain.   oxyCODONE-acetaminophen 5-325 MG tablet Commonly known as: PERCOCET/ROXICET Take 1 tablet by mouth every 4 (four) hours as needed for moderate pain or severe pain.   pantoprazole 40 MG tablet Commonly known as: PROTONIX Take 1 tablet (40 mg total) by mouth daily.   pravastatin 40 MG tablet Commonly known as: PRAVACHOL Take 40 mg by mouth daily.   predniSONE 10 MG tablet Commonly known as: DELTASONE 10 mg as needed. Tapered dosing when needed.   simvastatin 20 MG tablet Commonly known as: ZOCOR Take 20 mg by mouth daily.   sitaGLIPtin 100 MG tablet  Commonly known as: JANUVIA Take 100 mg by mouth daily.   zolpidem 10 MG tablet Commonly known as: AMBIEN Take 10 mg by mouth at bedtime.       Allergies:  Allergies  Allergen Reactions  . Aripiprazole     Other reaction(s): leg aching  . Codeine     REACTION: hives  . Duloxetine     Other reaction(s): ineffective  . Fluoxetine     Other reaction(s): ineffective    Past Medical History, Surgical history, Social history, and Family History were reviewed and updated.  Review of Systems: Review of Systems  Constitutional: Negative.   HENT: Negative.   Eyes: Negative.   Respiratory: Negative.   Cardiovascular: Negative.   Gastrointestinal: Negative.   Genitourinary: Negative.   Musculoskeletal: Positive for back pain and myalgias.  Skin: Positive for rash.    Neurological: Positive for sensory change.  Psychiatric/Behavioral: The patient is nervous/anxious.     Physical Exam:  weight is 148 lb 8 oz (67.4 kg). His temporal temperature is 98.6 F (37 C). His blood pressure is 121/86 and his pulse is 67. His respiration is 18 and oxygen saturation is 100%.   Wt Readings from Last 3 Encounters:  07/01/19 148 lb 8 oz (67.4 kg)  02/02/19 158 lb (71.7 kg)  12/29/18 153 lb (69.4 kg)    Physical Exam Vitals reviewed.  HENT:     Head: Normocephalic and atraumatic.  Eyes:     Pupils: Pupils are equal, round, and reactive to light.  Cardiovascular:     Rate and Rhythm: Normal rate and regular rhythm.     Heart sounds: Normal heart sounds.  Pulmonary:     Effort: Pulmonary effort is normal.     Breath sounds: Normal breath sounds.  Abdominal:     General: Bowel sounds are normal.     Palpations: Abdomen is soft.  Musculoskeletal:        General: No tenderness or deformity. Normal range of motion.     Cervical back: Normal range of motion.  Lymphadenopathy:     Cervical: No cervical adenopathy.  Skin:    General: Skin is warm and dry.     Findings: No erythema or rash.  Neurological:     Mental Status: He is alert and oriented to person, place, and time.  Psychiatric:        Behavior: Behavior normal.        Thought Content: Thought content normal.        Judgment: Judgment normal.     Lab Results  Component Value Date   WBC 8.8 07/01/2019   HGB 14.1 07/01/2019   HCT 43.3 07/01/2019   MCV 94.5 07/01/2019   PLT 251 07/01/2019   Lab Results  Component Value Date   FERRITIN 73.9 11/20/2009   IRON 76 11/20/2009   IRONPCTSAT 22.8 11/20/2009   Lab Results  Component Value Date   RBC 4.58 07/01/2019   Lab Results  Component Value Date   KPAFRELGTCHN 124.1 (H) 12/29/2018   LAMBDASER 14.6 12/29/2018   KAPLAMBRATIO 8.50 (H) 12/29/2018   Lab Results  Component Value Date   IGGSERUM 1,752 (H) 12/29/2018   IGGSERUM 1,740  (H) 12/29/2018   IGA 86 12/29/2018   IGA 88 12/29/2018   IGMSERUM 16 (L) 12/29/2018   IGMSERUM 18 (L) 12/29/2018   Lab Results  Component Value Date   TOTALPROTELP 7.3 12/29/2018   ALBUMINELP 4.1 12/29/2018   A1GS 0.2 12/29/2018   A2GS 0.7 12/29/2018  BETS 0.8 12/29/2018   GAMS 1.4 12/29/2018   MSPIKE 1.1 (H) 12/29/2018     Chemistry      Component Value Date/Time   NA 140 07/01/2019 1132   NA 145 05/07/2017 0945   NA 139 07/25/2016 0815   K 4.0 07/01/2019 1132   K 3.9 05/07/2017 0945   K 4.0 07/25/2016 0815   CL 102 07/01/2019 1132   CL 100 05/07/2017 0945   CO2 31 07/01/2019 1132   CO2 29 05/07/2017 0945   CO2 28 07/25/2016 0815   BUN 16 07/01/2019 1132   BUN 8 05/07/2017 0945   BUN 9.0 07/25/2016 0815   CREATININE 0.88 07/01/2019 1132   CREATININE 0.8 05/07/2017 0945   CREATININE 0.8 07/25/2016 0815      Component Value Date/Time   CALCIUM 9.7 07/01/2019 1132   CALCIUM 9.7 05/07/2017 0945   CALCIUM 9.6 07/25/2016 0815   ALKPHOS 59 07/01/2019 1132   ALKPHOS 60 05/07/2017 0945   ALKPHOS 57 07/25/2016 0815   AST 14 (L) 07/01/2019 1132   AST 15 07/25/2016 0815   ALT 12 07/01/2019 1132   ALT 20 05/07/2017 0945   ALT 13 07/25/2016 0815   BILITOT 0.5 07/01/2019 1132   BILITOT 0.55 07/25/2016 0815     Impression and Plan: Mr. Burget is a 64 yo white male with an IgG Kappa smoldering myeloma diagnosed 7 years ago. He was treated initially with Velcade and Decadron and then Revlimid.   So far, he has done well. His last bone marrow test in March 2018 showed only 7-10% plasma cells.   He is doing pretty well from my point of view.  I would be surprised if his myeloma numbers were much higher.  For right now, we will get him back in 6 months.    1/28/202112:40 PM

## 2019-07-02 LAB — KAPPA/LAMBDA LIGHT CHAINS
Kappa free light chain: 91.2 mg/L — ABNORMAL HIGH (ref 3.3–19.4)
Kappa, lambda light chain ratio: 13.41 — ABNORMAL HIGH (ref 0.26–1.65)
Lambda free light chains: 6.8 mg/L (ref 5.7–26.3)

## 2019-07-02 LAB — IGG, IGA, IGM
IgA: 75 mg/dL (ref 61–437)
IgG (Immunoglobin G), Serum: 1440 mg/dL (ref 603–1613)
IgM (Immunoglobulin M), Srm: 16 mg/dL — ABNORMAL LOW (ref 20–172)

## 2019-07-05 ENCOUNTER — Encounter: Payer: Self-pay | Admitting: *Deleted

## 2019-07-05 LAB — IMMUNOFIXATION REFLEX, SERUM
IgA: 80 mg/dL (ref 61–437)
IgG (Immunoglobin G), Serum: 1597 mg/dL (ref 603–1613)
IgM (Immunoglobulin M), Srm: 20 mg/dL (ref 20–172)

## 2019-07-05 LAB — PROTEIN ELECTROPHORESIS, SERUM, WITH REFLEX
A/G Ratio: 1.1 (ref 0.7–1.7)
Albumin ELP: 3.8 g/dL (ref 2.9–4.4)
Alpha-1-Globulin: 0.2 g/dL (ref 0.0–0.4)
Alpha-2-Globulin: 0.8 g/dL (ref 0.4–1.0)
Beta Globulin: 1 g/dL (ref 0.7–1.3)
Gamma Globulin: 1.5 g/dL (ref 0.4–1.8)
Globulin, Total: 3.5 g/dL (ref 2.2–3.9)
M-Spike, %: 1.2 g/dL — ABNORMAL HIGH
SPEP Interpretation: 0
Total Protein ELP: 7.3 g/dL (ref 6.0–8.5)

## 2019-12-29 ENCOUNTER — Encounter: Payer: Self-pay | Admitting: Hematology & Oncology

## 2019-12-29 ENCOUNTER — Other Ambulatory Visit: Payer: Self-pay

## 2019-12-29 ENCOUNTER — Inpatient Hospital Stay: Payer: Medicare Other | Attending: Hematology & Oncology

## 2019-12-29 ENCOUNTER — Inpatient Hospital Stay (HOSPITAL_BASED_OUTPATIENT_CLINIC_OR_DEPARTMENT_OTHER): Payer: Medicare Other | Admitting: Hematology & Oncology

## 2019-12-29 VITALS — BP 117/69 | HR 64 | Temp 98.3°F | Resp 18 | Wt 154.8 lb

## 2019-12-29 DIAGNOSIS — D472 Monoclonal gammopathy: Secondary | ICD-10-CM

## 2019-12-29 DIAGNOSIS — C9000 Multiple myeloma not having achieved remission: Secondary | ICD-10-CM | POA: Diagnosis not present

## 2019-12-29 LAB — CBC WITH DIFFERENTIAL (CANCER CENTER ONLY)
Abs Immature Granulocytes: 0.02 10*3/uL (ref 0.00–0.07)
Basophils Absolute: 0 10*3/uL (ref 0.0–0.1)
Basophils Relative: 1 %
Eosinophils Absolute: 0.1 10*3/uL (ref 0.0–0.5)
Eosinophils Relative: 2 %
HCT: 42.4 % (ref 39.0–52.0)
Hemoglobin: 13.8 g/dL (ref 13.0–17.0)
Immature Granulocytes: 0 %
Lymphocytes Relative: 20 %
Lymphs Abs: 1.5 10*3/uL (ref 0.7–4.0)
MCH: 30.2 pg (ref 26.0–34.0)
MCHC: 32.5 g/dL (ref 30.0–36.0)
MCV: 92.8 fL (ref 80.0–100.0)
Monocytes Absolute: 0.6 10*3/uL (ref 0.1–1.0)
Monocytes Relative: 8 %
Neutro Abs: 5.3 10*3/uL (ref 1.7–7.7)
Neutrophils Relative %: 69 %
Platelet Count: 207 10*3/uL (ref 150–400)
RBC: 4.57 MIL/uL (ref 4.22–5.81)
RDW: 12.5 % (ref 11.5–15.5)
WBC Count: 7.6 10*3/uL (ref 4.0–10.5)
nRBC: 0 % (ref 0.0–0.2)

## 2019-12-29 LAB — CMP (CANCER CENTER ONLY)
ALT: 13 U/L (ref 0–44)
AST: 16 U/L (ref 15–41)
Albumin: 4.6 g/dL (ref 3.5–5.0)
Alkaline Phosphatase: 53 U/L (ref 38–126)
Anion gap: 6 (ref 5–15)
BUN: 14 mg/dL (ref 8–23)
CO2: 31 mmol/L (ref 22–32)
Calcium: 10 mg/dL (ref 8.9–10.3)
Chloride: 101 mmol/L (ref 98–111)
Creatinine: 0.75 mg/dL (ref 0.61–1.24)
GFR, Est AFR Am: >60 mL/min (ref >60)
GFR, Estimated: >60 mL/min (ref >60)
Glucose, Bld: 129 mg/dL — ABNORMAL HIGH (ref 70–99)
Potassium: 4.1 mmol/L (ref 3.5–5.1)
Sodium: 138 mmol/L (ref 135–145)
Total Bilirubin: 0.5 mg/dL (ref 0.3–1.2)
Total Protein: 7.7 g/dL (ref 6.5–8.1)

## 2019-12-29 NOTE — Progress Notes (Signed)
Hematology and Oncology Follow Up Visit  [name] [name] [id number] May 29, 1956 64 y.o. 12/29/2019   Principle Diagnosis:  Smoldering IgG Kappa Myeloma   Current Therapy:   Observation    Interim History:  [name] [name] is here today for follow-up.  It is been 6 months since we last saw him.  Overall, he is doing pretty well.  He still has the pain issues.  He would like to get off the fentanyl patch.  He says that he will talk to his doctor regarding this.  As far as the IgG kappa smoldering myeloma goes, everything looks pretty good.  His last M spike was 1.2 g/dL.  This is holding stable.  His IgG level was 1500 mg/dL.  His kappa light chain was 9.1 mg/dL.  He has a decreased appetite.  His weight is up.  He has tried CBD oil.  There is been no problems with bowels or bladder.  He has had no swelling in the legs.  He has had no cough or shortness of breath.  He has had the coronavirus vaccine.  Overall, his performance status is ECOG 1.   Medications:  Allergies as of 12/29/2019      Reactions   Aripiprazole    Other reaction(s): leg aching   Codeine    REACTION: hives   Duloxetine    Other reaction(s): ineffective   Fluoxetine    Other reaction(s): ineffective      Medication List       Accurate as of December 29, 2019 12:46 PM. If you have any questions, ask your nurse or doctor.        ALPRAZolam 0.5 MG tablet Commonly known as: XANAX Take 0.5 mg by mouth as directed.   aspirin 81 MG tablet Take 1 tablet (81 mg total) by mouth daily.   clopidogrel 75 MG tablet Commonly known as: PLAVIX Take 75 mg by mouth daily.   doxycycline 100 MG tablet Commonly known as: VIBRA-TABS Take 100 mg by mouth 2 (two) times daily.   EPINEPHrine 0.3 mg/0.3 mL Soaj injection Commonly known as: EPI-PEN   escitalopram 20 MG tablet Commonly known as: LEXAPRO Take 20 mg by mouth daily.   fentaNYL 50 MCG/HR Commonly known as: [place] 1 patch onto the skin every other  day.   fentaNYL 37.5 MCG/HR Pt72 Apply 1 patch topically every other day.   Flomax 0.4 MG Caps capsule Generic drug: tamsulosin Take 0.4 mg by mouth daily.   gabapentin 300 MG capsule Commonly known as: NEURONTIN TAKE 3 TABS BY MOUTH IN THE  AM & 4 TABS BY MOUTH IN THE PM   metFORMIN 500 MG tablet Commonly known as: GLUCOPHAGE Take 1,000 mg by mouth 2 (two) times daily.   methocarbamol 750 MG tablet Commonly known as: ROBAXIN Take 750 mg by mouth 3 (three) times daily as needed.   nitroGLYCERIN 0.4 MG SL tablet Commonly known as: NITROSTAT Place 1 tablet (0.4 mg total) under the tongue every 5 (five) minutes as needed for chest pain.   oxyCODONE-acetaminophen 5-325 MG tablet Commonly known as: PERCOCET/ROXICET Take 1 tablet by mouth every 4 (four) hours as needed for moderate pain or severe pain.   pantoprazole 40 MG tablet Commonly known as: PROTONIX Take 1 tablet (40 mg total) by mouth daily.   pravastatin 40 MG tablet Commonly known as: PRAVACHOL Take 40 mg by mouth daily.   predniSONE 10 MG tablet Commonly known as: DELTASONE 10 mg as needed. Tapered dosing when needed.  simvastatin 20 MG tablet Commonly known as: ZOCOR Take 20 mg by mouth daily.   sitaGLIPtin 100 MG tablet Commonly known as: JANUVIA Take 100 mg by mouth daily.   zolpidem 10 MG tablet Commonly known as: AMBIEN Take 10 mg by mouth at bedtime.       Allergies:  Allergies  Allergen Reactions  . Aripiprazole     Other reaction(s): leg aching  . Codeine     REACTION: hives  . Duloxetine     Other reaction(s): ineffective  . Fluoxetine     Other reaction(s): ineffective    Past Medical History, Surgical history, Social history, and Family History were reviewed and updated.  Review of Systems: Review of Systems  Constitutional: Negative.   HENT: Negative.   Eyes: Negative.   Respiratory: Negative.   Cardiovascular: Negative.   Gastrointestinal: Negative.   Genitourinary:  Negative.   Musculoskeletal: Positive for back pain and myalgias.  Skin: Positive for rash.  Neurological: Positive for sensory change.  Psychiatric/Behavioral: The patient is nervous/anxious.     Physical Exam:  weight is 154 lb 12 oz (70.2 kg). His oral temperature is 98.3 F (36.8 C). His blood pressure is 117/69 and his pulse is 64. His respiration is 18 and oxygen saturation is 100%.   Wt Readings from Last 3 Encounters:  12/29/19 154 lb 12 oz (70.2 kg)  07/01/19 148 lb 8 oz (67.4 kg)  02/02/19 158 lb (71.7 kg)    Physical Exam Vitals reviewed.  HENT:     Head: Normocephalic and atraumatic.  Eyes:     Pupils: Pupils are equal, round, and reactive to light.  Cardiovascular:     Rate and Rhythm: Normal rate and regular rhythm.     Heart sounds: Normal heart sounds.  Pulmonary:     Effort: Pulmonary effort is normal.     Breath sounds: Normal breath sounds.  Abdominal:     General: Bowel sounds are normal.     Palpations: Abdomen is soft.  Musculoskeletal:        General: No tenderness or deformity. Normal range of motion.     Cervical back: Normal range of motion.  Lymphadenopathy:     Cervical: No cervical adenopathy.  Skin:    General: Skin is warm and dry.     Findings: No erythema or rash.  Neurological:     Mental Status: He is alert and oriented to person, place, and time.  Psychiatric:        Behavior: Behavior normal.        Thought Content: Thought content normal.        Judgment: Judgment normal.     Lab Results  Component Value Date   WBC 7.6 12/29/2019   HGB 13.8 12/29/2019   HCT 42.4 12/29/2019   MCV 92.8 12/29/2019   PLT 207 12/29/2019   Lab Results  Component Value Date   FERRITIN 73.9 11/20/2009   IRON 76 11/20/2009   IRONPCTSAT 22.8 11/20/2009   Lab Results  Component Value Date   RBC 4.57 12/29/2019   Lab Results  Component Value Date   KPAFRELGTCHN 91.2 (H) 07/01/2019   LAMBDASER 6.8 07/01/2019   KAPLAMBRATIO 13.41 (H)  07/01/2019   Lab Results  Component Value Date   IGGSERUM 1,440 07/01/2019   IGGSERUM 1,597 07/01/2019   IGA 75 07/01/2019   IGA 80 07/01/2019   IGMSERUM 16 (L) 07/01/2019   IGMSERUM 20 07/01/2019   Lab Results  Component Value Date   TOTALPROTELP  7.3 07/01/2019   ALBUMINELP 3.8 07/01/2019   A1GS 0.2 07/01/2019   A2GS 0.8 07/01/2019   BETS 1.0 07/01/2019   GAMS 1.5 07/01/2019   MSPIKE 1.2 (H) 07/01/2019     Chemistry      Component Value Date/Time   NA 138 12/29/2019 1155   NA 145 05/07/2017 0945   NA 139 07/25/2016 0815   K 4.1 12/29/2019 1155   K 3.9 05/07/2017 0945   K 4.0 07/25/2016 0815   CL 101 12/29/2019 1155   CL 100 05/07/2017 0945   CO2 31 12/29/2019 1155   CO2 29 05/07/2017 0945   CO2 28 07/25/2016 0815   BUN 14 12/29/2019 1155   BUN 8 05/07/2017 0945   BUN 9.0 07/25/2016 0815   CREATININE 0.75 12/29/2019 1155   CREATININE 0.8 05/07/2017 0945   CREATININE 0.8 07/25/2016 0815      Component Value Date/Time   CALCIUM 10.0 12/29/2019 1155   CALCIUM 9.7 05/07/2017 0945   CALCIUM 9.6 07/25/2016 0815   ALKPHOS 53 12/29/2019 1155   ALKPHOS 60 05/07/2017 0945   ALKPHOS 57 07/25/2016 0815   AST 16 12/29/2019 1155   AST 15 07/25/2016 0815   ALT 13 12/29/2019 1155   ALT 20 05/07/2017 0945   ALT 13 07/25/2016 0815   BILITOT 0.5 12/29/2019 1155   BILITOT 0.55 07/25/2016 0815     Impression and Plan: Mr. Pursell is a 64 yo white male with an IgG Kappa smoldering myeloma diagnosed 7 years ago. He was treated initially with Velcade and Decadron and then Revlimid.   So far, he has done well. His last bone marrow test in March 2018 showed only 7-10% plasma cells.   He is doing pretty well from my point of view.  I would be surprised if his myeloma numbers were much higher.  I would think that we receive change in his total protein and albumin.  Hopefully, he will be able to get the pain medications adjusted a little bit.  He just wants to get off the fentanyl  patch.  For right now, we will get him back in 6 months.    7/28/202112:46 PM

## 2019-12-30 LAB — KAPPA/LAMBDA LIGHT CHAINS
Kappa free light chain: 136.1 mg/L — ABNORMAL HIGH (ref 3.3–19.4)
Kappa, lambda light chain ratio: 17.01 — ABNORMAL HIGH (ref 0.26–1.65)
Lambda free light chains: 8 mg/L (ref 5.7–26.3)

## 2019-12-30 LAB — IGG, IGA, IGM
IgA: 86 mg/dL (ref 61–437)
IgG (Immunoglobin G), Serum: 1620 mg/dL — ABNORMAL HIGH (ref 603–1613)
IgM (Immunoglobulin M), Srm: 18 mg/dL — ABNORMAL LOW (ref 20–172)

## 2020-06-22 DIAGNOSIS — M62830 Muscle spasm of back: Secondary | ICD-10-CM | POA: Diagnosis not present

## 2020-06-22 DIAGNOSIS — M461 Sacroiliitis, not elsewhere classified: Secondary | ICD-10-CM | POA: Diagnosis not present

## 2020-06-22 DIAGNOSIS — G894 Chronic pain syndrome: Secondary | ICD-10-CM | POA: Diagnosis not present

## 2020-06-22 DIAGNOSIS — G629 Polyneuropathy, unspecified: Secondary | ICD-10-CM | POA: Diagnosis not present

## 2020-06-30 ENCOUNTER — Inpatient Hospital Stay (HOSPITAL_BASED_OUTPATIENT_CLINIC_OR_DEPARTMENT_OTHER): Payer: Medicare Other | Admitting: Hematology & Oncology

## 2020-06-30 ENCOUNTER — Encounter: Payer: Self-pay | Admitting: Hematology & Oncology

## 2020-06-30 ENCOUNTER — Other Ambulatory Visit: Payer: Self-pay

## 2020-06-30 ENCOUNTER — Telehealth: Payer: Self-pay

## 2020-06-30 ENCOUNTER — Inpatient Hospital Stay: Payer: Medicare Other | Attending: Hematology & Oncology

## 2020-06-30 VITALS — BP 116/80 | HR 56 | Temp 98.1°F | Resp 18 | Wt 159.0 lb

## 2020-06-30 DIAGNOSIS — C9000 Multiple myeloma not having achieved remission: Secondary | ICD-10-CM

## 2020-06-30 DIAGNOSIS — K5900 Constipation, unspecified: Secondary | ICD-10-CM | POA: Diagnosis not present

## 2020-06-30 DIAGNOSIS — D472 Monoclonal gammopathy: Secondary | ICD-10-CM

## 2020-06-30 LAB — CBC WITH DIFFERENTIAL (CANCER CENTER ONLY)
Abs Immature Granulocytes: 0.01 10*3/uL (ref 0.00–0.07)
Basophils Absolute: 0.1 10*3/uL (ref 0.0–0.1)
Basophils Relative: 1 %
Eosinophils Absolute: 0.1 10*3/uL (ref 0.0–0.5)
Eosinophils Relative: 2 %
HCT: 43.3 % (ref 39.0–52.0)
Hemoglobin: 14.1 g/dL (ref 13.0–17.0)
Immature Granulocytes: 0 %
Lymphocytes Relative: 24 %
Lymphs Abs: 1.6 10*3/uL (ref 0.7–4.0)
MCH: 30.1 pg (ref 26.0–34.0)
MCHC: 32.6 g/dL (ref 30.0–36.0)
MCV: 92.5 fL (ref 80.0–100.0)
Monocytes Absolute: 0.5 10*3/uL (ref 0.1–1.0)
Monocytes Relative: 8 %
Neutro Abs: 4.3 10*3/uL (ref 1.7–7.7)
Neutrophils Relative %: 65 %
Platelet Count: 206 10*3/uL (ref 150–400)
RBC: 4.68 MIL/uL (ref 4.22–5.81)
RDW: 13.3 % (ref 11.5–15.5)
WBC Count: 6.6 10*3/uL (ref 4.0–10.5)
nRBC: 0 % (ref 0.0–0.2)

## 2020-06-30 LAB — CMP (CANCER CENTER ONLY)
ALT: 12 U/L (ref 0–44)
AST: 18 U/L (ref 15–41)
Albumin: 4.5 g/dL (ref 3.5–5.0)
Alkaline Phosphatase: 56 U/L (ref 38–126)
Anion gap: 6 (ref 5–15)
BUN: 11 mg/dL (ref 8–23)
CO2: 31 mmol/L (ref 22–32)
Calcium: 10.1 mg/dL (ref 8.9–10.3)
Chloride: 102 mmol/L (ref 98–111)
Creatinine: 0.89 mg/dL (ref 0.61–1.24)
GFR, Estimated: >60 mL/min (ref >60)
Glucose, Bld: 102 mg/dL — ABNORMAL HIGH (ref 70–99)
Potassium: 4 mmol/L (ref 3.5–5.1)
Sodium: 139 mmol/L (ref 135–145)
Total Bilirubin: 0.5 mg/dL (ref 0.3–1.2)
Total Protein: 7.9 g/dL (ref 6.5–8.1)

## 2020-06-30 LAB — LACTATE DEHYDROGENASE: LDH: 113 U/L (ref 98–192)
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
## 2020-06-30 NOTE — Telephone Encounter (Signed)
appts made and printed for pt per 06/30/20 los   [employer]

## 2020-07-01 LAB — IGG, IGA, IGM
IgA: 91 mg/dL (ref 61–437)
IgG (Immunoglobin G), Serum: 1649 mg/dL — ABNORMAL HIGH (ref 603–1613)
IgM (Immunoglobulin M), Srm: 21 mg/dL (ref 20–172)

## 2020-07-03 LAB — KAPPA/LAMBDA LIGHT CHAINS
Kappa free light chain: 120.2 mg/L — ABNORMAL HIGH (ref 3.3–19.4)
Kappa, lambda light chain ratio: 14.66 — ABNORMAL HIGH (ref 0.26–1.65)
Lambda free light chains: 8.2 mg/L (ref 5.7–26.3)

## 2020-07-05 LAB — IMMUNOFIXATION REFLEX, SERUM
IgA: 91 mg/dL (ref 61–437)
IgG (Immunoglobin G), Serum: 1691 mg/dL — ABNORMAL HIGH (ref 603–1613)
IgM (Immunoglobulin M), Srm: 19 mg/dL — ABNORMAL LOW (ref 20–172)

## 2020-07-05 LAB — PROTEIN ELECTROPHORESIS, SERUM, WITH REFLEX
A/G Ratio: 1 (ref 0.7–1.7)
Albumin ELP: 4 g/dL (ref 2.9–4.4)
Alpha-1-Globulin: 0.3 g/dL (ref 0.0–0.4)
Alpha-2-Globulin: 0.9 g/dL (ref 0.4–1.0)
Beta Globulin: 1.1 g/dL (ref 0.7–1.3)
Gamma Globulin: 1.6 g/dL (ref 0.4–1.8)
Globulin, Total: 4 g/dL — ABNORMAL HIGH (ref 2.2–3.9)
M-Spike, %: 1.4 g/dL — ABNORMAL HIGH
SPEP Interpretation: 0
Total Protein ELP: 8 g/dL (ref 6.0–8.5)

## 2020-07-10 ENCOUNTER — Encounter: Payer: Self-pay | Admitting: *Deleted

## 2020-08-10 DIAGNOSIS — M47816 Spondylosis without myelopathy or radiculopathy, lumbar region: Secondary | ICD-10-CM | POA: Diagnosis not present

## 2020-08-10 DIAGNOSIS — M5116 Intervertebral disc disorders with radiculopathy, lumbar region: Secondary | ICD-10-CM | POA: Diagnosis not present

## 2020-08-14 DIAGNOSIS — M5116 Intervertebral disc disorders with radiculopathy, lumbar region: Secondary | ICD-10-CM | POA: Diagnosis not present

## 2020-08-17 DIAGNOSIS — Z79891 Long term (current) use of opiate analgesic: Secondary | ICD-10-CM | POA: Diagnosis not present

## 2020-08-17 DIAGNOSIS — G894 Chronic pain syndrome: Secondary | ICD-10-CM | POA: Diagnosis not present

## 2020-08-17 DIAGNOSIS — M461 Sacroiliitis, not elsewhere classified: Secondary | ICD-10-CM | POA: Diagnosis not present

## 2020-08-17 DIAGNOSIS — G629 Polyneuropathy, unspecified: Secondary | ICD-10-CM | POA: Diagnosis not present

## 2020-08-17 DIAGNOSIS — M62830 Muscle spasm of back: Secondary | ICD-10-CM | POA: Diagnosis not present

## 2020-08-24 DIAGNOSIS — Z23 Encounter for immunization: Secondary | ICD-10-CM | POA: Diagnosis not present

## 2020-08-24 DIAGNOSIS — D472 Monoclonal gammopathy: Secondary | ICD-10-CM | POA: Diagnosis not present

## 2020-08-24 DIAGNOSIS — R5382 Chronic fatigue, unspecified: Secondary | ICD-10-CM | POA: Diagnosis not present

## 2020-08-24 DIAGNOSIS — M549 Dorsalgia, unspecified: Secondary | ICD-10-CM | POA: Diagnosis not present

## 2020-08-24 DIAGNOSIS — E1142 Type 2 diabetes mellitus with diabetic polyneuropathy: Secondary | ICD-10-CM | POA: Diagnosis not present

## 2020-08-24 DIAGNOSIS — I119 Hypertensive heart disease without heart failure: Secondary | ICD-10-CM | POA: Diagnosis not present

## 2020-08-24 DIAGNOSIS — I2510 Atherosclerotic heart disease of native coronary artery without angina pectoris: Secondary | ICD-10-CM | POA: Diagnosis not present

## 2020-08-24 DIAGNOSIS — E782 Mixed hyperlipidemia: Secondary | ICD-10-CM | POA: Diagnosis not present

## 2020-08-24 DIAGNOSIS — Z0000 Encounter for general adult medical examination without abnormal findings: Secondary | ICD-10-CM | POA: Diagnosis not present

## 2020-10-05 DIAGNOSIS — M62830 Muscle spasm of back: Secondary | ICD-10-CM | POA: Diagnosis not present

## 2020-10-05 DIAGNOSIS — G629 Polyneuropathy, unspecified: Secondary | ICD-10-CM | POA: Diagnosis not present

## 2020-10-05 DIAGNOSIS — G894 Chronic pain syndrome: Secondary | ICD-10-CM | POA: Diagnosis not present

## 2020-10-05 DIAGNOSIS — M461 Sacroiliitis, not elsewhere classified: Secondary | ICD-10-CM | POA: Diagnosis not present

## 2020-11-03 DIAGNOSIS — M62830 Muscle spasm of back: Secondary | ICD-10-CM | POA: Diagnosis not present

## 2020-11-03 DIAGNOSIS — M461 Sacroiliitis, not elsewhere classified: Secondary | ICD-10-CM | POA: Diagnosis not present

## 2020-11-03 DIAGNOSIS — G629 Polyneuropathy, unspecified: Secondary | ICD-10-CM | POA: Diagnosis not present

## 2020-11-03 DIAGNOSIS — G894 Chronic pain syndrome: Secondary | ICD-10-CM | POA: Diagnosis not present

## 2020-12-27 ENCOUNTER — Inpatient Hospital Stay: Payer: Medicare Other | Admitting: Hematology & Oncology

## 2020-12-27 ENCOUNTER — Other Ambulatory Visit: Payer: Self-pay

## 2020-12-27 ENCOUNTER — Telehealth: Payer: Self-pay

## 2020-12-27 ENCOUNTER — Encounter: Payer: Self-pay | Admitting: Hematology & Oncology

## 2020-12-27 ENCOUNTER — Inpatient Hospital Stay: Payer: Medicare Other | Attending: Hematology & Oncology

## 2020-12-27 VITALS — BP 132/85 | HR 55 | Temp 97.8°F | Resp 16 | Wt 162.0 lb

## 2020-12-27 DIAGNOSIS — C9000 Multiple myeloma not having achieved remission: Secondary | ICD-10-CM | POA: Insufficient documentation

## 2020-12-27 DIAGNOSIS — D472 Monoclonal gammopathy: Secondary | ICD-10-CM

## 2020-12-27 LAB — CMP (CANCER CENTER ONLY)
ALT: 12 U/L (ref 0–44)
AST: 17 U/L (ref 15–41)
Albumin: 4.3 g/dL (ref 3.5–5.0)
Alkaline Phosphatase: 61 U/L (ref 38–126)
Anion gap: 7 (ref 5–15)
BUN: 9 mg/dL (ref 8–23)
CO2: 29 mmol/L (ref 22–32)
Calcium: 9.6 mg/dL (ref 8.9–10.3)
Chloride: 99 mmol/L (ref 98–111)
Creatinine: 0.94 mg/dL (ref 0.61–1.24)
GFR, Estimated: >60 mL/min (ref >60)
Glucose, Bld: 129 mg/dL — ABNORMAL HIGH (ref 70–99)
Potassium: 4.3 mmol/L (ref 3.5–5.1)
Sodium: 135 mmol/L (ref 135–145)
Total Bilirubin: 0.4 mg/dL (ref 0.3–1.2)
Total Protein: 7.6 g/dL (ref 6.5–8.1)

## 2020-12-27 LAB — CBC WITH DIFFERENTIAL (CANCER CENTER ONLY)
Abs Immature Granulocytes: 0.02 10*3/uL (ref 0.00–0.07)
Basophils Absolute: 0.1 10*3/uL (ref 0.0–0.1)
Basophils Relative: 1 %
Eosinophils Absolute: 0.2 10*3/uL (ref 0.0–0.5)
Eosinophils Relative: 4 %
HCT: 40.3 % (ref 39.0–52.0)
Hemoglobin: 13.2 g/dL (ref 13.0–17.0)
Immature Granulocytes: 0 %
Lymphocytes Relative: 26 %
Lymphs Abs: 1.4 10*3/uL (ref 0.7–4.0)
MCH: 30.5 pg (ref 26.0–34.0)
MCHC: 32.8 g/dL (ref 30.0–36.0)
MCV: 93.1 fL (ref 80.0–100.0)
Monocytes Absolute: 0.5 10*3/uL (ref 0.1–1.0)
Monocytes Relative: 9 %
Neutro Abs: 3.3 10*3/uL (ref 1.7–7.7)
Neutrophils Relative %: 60 %
Platelet Count: 227 10*3/uL (ref 150–400)
RBC: 4.33 MIL/uL (ref 4.22–5.81)
RDW: 12.4 % (ref 11.5–15.5)
WBC Count: 5.5 10*3/uL (ref 4.0–10.5)
nRBC: 0 % (ref 0.0–0.2)

## 2020-12-27 LAB — LACTATE DEHYDROGENASE: LDH: 124 U/L (ref 98–192)

## 2020-12-27 NOTE — Progress Notes (Signed)
Hematology and Oncology Follow Up Visit  RAYN ENDERSON [id number] 1956/02/24 65 y.o. 12/27/2020   Principle Diagnosis:  Smoldering IgG Kappa Myeloma   Current Therapy:   Observation    Interim History:  Mr. Milo is here today for follow-up.  We see him every 6 months.  Since we last saw him, he has been doing quite well.  He has had no problems with nausea or vomiting.  He now is helping take care of a nephew who has Down syndrome.  The nephew's mom Venetia Constable and is in rehabilitation right now.  The last time that we saw him in January, his M spike was 1.4 g/dL.  His IgG level was 1670 mg/dL.  The Kappa light chain was 12 mg/dL.  All this is stable.  He has had no problems with COVID.  He has had no change in bowel or bladder habits.  There has been no rashes.  He has had no cough.  He has had no bleeding.  Overall, his performance status is ECOG 0.  .   Medications:  Allergies as of 12/27/2020       Reactions   Aripiprazole Other (See Comments)   Other reaction(s): leg aching   Codeine    REACTION: hives   Duloxetine    Other reaction(s): ineffective   Duloxetine Hcl Other (See Comments)   Fluoxetine    Other reaction(s): ineffective   Other Other (See Comments)   Simvastatin Other (See Comments)        Medication List        Accurate as of December 27, 2020 12:58 PM. If you have any questions, ask your nurse or doctor.          STOP taking these medications    aspirin 81 MG tablet Stopped by: [name], MD   Flomax 0.4 MG Caps capsule Generic drug: tamsulosin Stopped by: [name], MD   triamcinolone cream 0.5 % Commonly known as: KENALOG Stopped by: [name], MD   triazolam 0.25 MG tablet Commonly known as: HALCION Stopped by: [name], MD       TAKE these medications    ALPRAZolam 0.5 MG tablet Commonly known as: XANAX Take 0.5 mg by mouth as directed.   clopidogrel 75 MG tablet Commonly known as: PLAVIX Take 75  mg by mouth daily.   doxycycline 100 MG tablet Commonly known as: VIBRA-TABS Take 100 mg by mouth 2 (two) times daily.   EPINEPHrine 0.3 mg/0.3 mL Soaj injection Commonly known as: EPI-PEN   escitalopram 20 MG tablet Commonly known as: LEXAPRO Take 20 mg by mouth daily.   fentaNYL 37.5 MCG/HR Pt72 Apply 1 patch topically every other day. What changed: Another medication with the same name was removed. Continue taking this medication, and follow the directions you see here. Changed by: [name], MD   gabapentin 300 MG capsule Commonly known as: NEURONTIN TAKE 3 TABS BY MOUTH IN THE  AM & 4 TABS BY MOUTH IN THE PM   metFORMIN 500 MG tablet Commonly known as: GLUCOPHAGE Take 1,000 mg by mouth 2 (two) times daily.   methocarbamol 500 MG tablet Commonly known as: ROBAXIN Take 500 mg by mouth 3 (three) times daily. What changed: Another medication with the same name was removed. Continue taking this medication, and follow the directions you see here. Changed by: [name], MD   nitroGLYCERIN 0.4 MG SL tablet Commonly known as: NITROSTAT Place 1 tablet (0.4 mg  total) under the tongue every 5 (five) minutes as needed for chest pain.   pantoprazole 40 MG tablet Commonly known as: PROTONIX Take 1 tablet (40 mg total) by mouth daily.   Percocet 10-325 MG tablet Generic drug: oxyCODONE-acetaminophen Take 1 tablet by mouth 3 (three) times daily as needed.   pravastatin 40 MG tablet Commonly known as: PRAVACHOL Take 40 mg by mouth daily.   predniSONE 10 MG tablet Commonly known as: DELTASONE 10 mg as needed. Tapered dosing when needed.   simvastatin 20 MG tablet Commonly known as: ZOCOR Take 20 mg by mouth daily.   sitaGLIPtin 100 MG tablet Commonly known as: JANUVIA Take 100 mg by mouth daily.   Xtampza ER 27 MG C12a Generic drug: oxyCODONE ER Take 1 capsule by mouth every 12 (twelve) hours.   zolpidem 10 MG tablet Commonly known as: AMBIEN Take 10 mg  by mouth at bedtime.        Allergies:  Allergies  Allergen Reactions   Aripiprazole Other (See Comments)    Other reaction(s): leg aching   Codeine     REACTION: hives   Duloxetine     Other reaction(s): ineffective   Duloxetine Hcl Other (See Comments)   Fluoxetine     Other reaction(s): ineffective   Other Other (See Comments)   Simvastatin Other (See Comments)    Past Medical History, Surgical history, Social history, and Family History were reviewed and updated.  Review of Systems: Review of Systems  Constitutional: Negative.   HENT: Negative.    Eyes: Negative.   Respiratory: Negative.    Cardiovascular: Negative.   Gastrointestinal: Negative.   Genitourinary: Negative.   Musculoskeletal:  Positive for back pain and myalgias.  Skin:  Positive for rash.  Neurological:  Positive for sensory change.  Psychiatric/Behavioral:  The patient is nervous/anxious.    Physical Exam:  weight is 162 lb (73.5 kg). His oral temperature is 97.8 F (36.6 C). His blood pressure is 132/85 and his pulse is 55 (abnormal). His respiration is 16 and oxygen saturation is 99%.   Wt Readings from Last 3 Encounters:  12/27/20 162 lb (73.5 kg)  06/30/20 159 lb (72.1 kg)  12/29/19 154 lb 12 oz (70.2 kg)    Physical Exam Vitals reviewed.  HENT:     Head: Normocephalic and atraumatic.  Eyes:     Pupils: Pupils are equal, round, and reactive to light.  Cardiovascular:     Rate and Rhythm: Normal rate and regular rhythm.     Heart sounds: Normal heart sounds.  Pulmonary:     Effort: Pulmonary effort is normal.     Breath sounds: Normal breath sounds.  Abdominal:     General: Bowel sounds are normal.     Palpations: Abdomen is soft.  Musculoskeletal:        General: No tenderness or deformity. Normal range of motion.     Cervical back: Normal range of motion.  Lymphadenopathy:     Cervical: No cervical adenopathy.  Skin:    General: Skin is warm and dry.     Findings: No  erythema or rash.  Neurological:     Mental Status: He is alert and oriented to person, place, and time.  Psychiatric:        Behavior: Behavior normal.        Thought Content: Thought content normal.        Judgment: Judgment normal.    Lab Results  Component Value Date   WBC 5.5 12/27/2020  HGB 13.2 12/27/2020   HCT 40.3 12/27/2020   MCV 93.1 12/27/2020   PLT 227 12/27/2020   Lab Results  Component Value Date   FERRITIN 73.9 11/20/2009   IRON 76 11/20/2009   IRONPCTSAT 22.8 11/20/2009   Lab Results  Component Value Date   RBC 4.33 12/27/2020   Lab Results  Component Value Date   KPAFRELGTCHN 120.2 (H) 06/30/2020   LAMBDASER 8.2 06/30/2020   KAPLAMBRATIO 14.66 (H) 06/30/2020   Lab Results  Component Value Date   IGGSERUM 8,185 (H) 06/30/2020   IGGSERUM 1,691 (H) 06/30/2020   IGA 91 06/30/2020   IGA 91 06/30/2020   IGMSERUM 21 06/30/2020   IGMSERUM 19 (L) 06/30/2020   Lab Results  Component Value Date   TOTALPROTELP 8.0 06/30/2020   ALBUMINELP 4.0 06/30/2020   A1GS 0.3 06/30/2020   A2GS 0.9 06/30/2020   BETS 1.1 06/30/2020   GAMS 1.6 06/30/2020   MSPIKE 1.4 (H) 06/30/2020     Chemistry      Component Value Date/Time   NA 135 12/27/2020 1202   NA 145 05/07/2017 0945   NA 139 07/25/2016 0815   K 4.3 12/27/2020 1202   K 3.9 05/07/2017 0945   K 4.0 07/25/2016 0815   CL 99 12/27/2020 1202   CL 100 05/07/2017 0945   CO2 29 12/27/2020 1202   CO2 29 05/07/2017 0945   CO2 28 07/25/2016 0815   BUN 9 12/27/2020 1202   BUN 8 05/07/2017 0945   BUN 9.0 07/25/2016 0815   CREATININE 0.94 12/27/2020 1202   CREATININE 0.8 05/07/2017 0945   CREATININE 0.8 07/25/2016 0815      Component Value Date/Time   CALCIUM 9.6 12/27/2020 1202   CALCIUM 9.7 05/07/2017 0945   CALCIUM 9.6 07/25/2016 0815   ALKPHOS 61 12/27/2020 1202   ALKPHOS 60 05/07/2017 0945   ALKPHOS 57 07/25/2016 0815   AST 17 12/27/2020 1202   AST 15 07/25/2016 0815   ALT 12 12/27/2020 1202    ALT 20 05/07/2017 0945   ALT 13 07/25/2016 0815   BILITOT 0.4 12/27/2020 1202   BILITOT 0.55 07/25/2016 0815     Impression and Plan: Mr. Ropp is a 65 yo white male with an IgG Kappa smoldering myeloma diagnosed 8 years ago. He was treated initially with Velcade and Decadron and then Revlimid.   So far, he has done well. His last bone marrow test in March 2018 showed only 7-10% plasma cells.   We like to see what his myeloma studies show.  Hopefully they will be stable.  I would like to think that every 45-monthfollow-up is appropriate.  I really do not think that we have to get back sooner unless we see a marked change in his myeloma studies.  If we do see a change in the myeloma studies, then we will have to think about another bone marrow biopsy on him.    7/27/202212:58 PM

## 2020-12-28 LAB — KAPPA/LAMBDA LIGHT CHAINS
Kappa free light chain: 127.1 mg/L — ABNORMAL HIGH (ref 3.3–19.4)
Kappa, lambda light chain ratio: 12.1 — ABNORMAL HIGH (ref 0.26–1.65)
Lambda free light chains: 10.5 mg/L (ref 5.7–26.3)

## 2020-12-28 LAB — IGG, IGA, IGM
IgA: 80 mg/dL (ref 61–437)
IgG (Immunoglobin G), Serum: 1639 mg/dL — ABNORMAL HIGH (ref 603–1613)
IgM (Immunoglobulin M), Srm: 20 mg/dL (ref 20–172)

## 2020-12-29 DIAGNOSIS — M62830 Muscle spasm of back: Secondary | ICD-10-CM | POA: Diagnosis not present

## 2020-12-29 DIAGNOSIS — G629 Polyneuropathy, unspecified: Secondary | ICD-10-CM | POA: Diagnosis not present

## 2020-12-29 DIAGNOSIS — M461 Sacroiliitis, not elsewhere classified: Secondary | ICD-10-CM | POA: Diagnosis not present

## 2020-12-29 DIAGNOSIS — G894 Chronic pain syndrome: Secondary | ICD-10-CM | POA: Diagnosis not present

## 2020-12-29 LAB — PROTEIN ELECTROPHORESIS, SERUM, WITH REFLEX
A/G Ratio: 1.2 (ref 0.7–1.7)
Albumin ELP: 3.9 g/dL (ref 2.9–4.4)
Alpha-1-Globulin: 0.2 g/dL (ref 0.0–0.4)
Alpha-2-Globulin: 0.8 g/dL (ref 0.4–1.0)
Beta Globulin: 0.9 g/dL (ref 0.7–1.3)
Gamma Globulin: 1.4 g/dL (ref 0.4–1.8)
Globulin, Total: 3.3 g/dL (ref 2.2–3.9)
M-Spike, %: 1.2 g/dL — ABNORMAL HIGH
SPEP Interpretation: 0
Total Protein ELP: 7.2 g/dL (ref 6.0–8.5)

## 2020-12-29 LAB — IMMUNOFIXATION REFLEX, SERUM
IgA: 86 mg/dL (ref 61–437)
IgG (Immunoglobin G), Serum: 1639 mg/dL — ABNORMAL HIGH (ref 603–1613)
IgM (Immunoglobulin M), Srm: 20 mg/dL (ref 20–172)

## 2021-01-29 DIAGNOSIS — G894 Chronic pain syndrome: Secondary | ICD-10-CM | POA: Diagnosis not present

## 2021-01-29 DIAGNOSIS — M62830 Muscle spasm of back: Secondary | ICD-10-CM | POA: Diagnosis not present

## 2021-01-29 DIAGNOSIS — M461 Sacroiliitis, not elsewhere classified: Secondary | ICD-10-CM | POA: Diagnosis not present

## 2021-01-29 DIAGNOSIS — G629 Polyneuropathy, unspecified: Secondary | ICD-10-CM | POA: Diagnosis not present

## 2021-01-29 DIAGNOSIS — Z79891 Long term (current) use of opiate analgesic: Secondary | ICD-10-CM | POA: Diagnosis not present

## 2021-02-06 DIAGNOSIS — G629 Polyneuropathy, unspecified: Secondary | ICD-10-CM | POA: Diagnosis not present

## 2021-02-06 DIAGNOSIS — G894 Chronic pain syndrome: Secondary | ICD-10-CM | POA: Diagnosis not present

## 2021-02-06 DIAGNOSIS — M461 Sacroiliitis, not elsewhere classified: Secondary | ICD-10-CM | POA: Diagnosis not present

## 2021-02-06 DIAGNOSIS — M62830 Muscle spasm of back: Secondary | ICD-10-CM | POA: Diagnosis not present

## 2021-02-20 DIAGNOSIS — Q740 Other congenital malformations of upper limb(s), including shoulder girdle: Secondary | ICD-10-CM | POA: Diagnosis not present

## 2021-02-20 DIAGNOSIS — E782 Mixed hyperlipidemia: Secondary | ICD-10-CM | POA: Diagnosis not present

## 2021-02-20 DIAGNOSIS — E1142 Type 2 diabetes mellitus with diabetic polyneuropathy: Secondary | ICD-10-CM | POA: Diagnosis not present

## 2021-02-20 DIAGNOSIS — Z23 Encounter for immunization: Secondary | ICD-10-CM | POA: Diagnosis not present

## 2021-02-20 DIAGNOSIS — I119 Hypertensive heart disease without heart failure: Secondary | ICD-10-CM | POA: Diagnosis not present

## 2021-02-20 DIAGNOSIS — L719 Rosacea, unspecified: Secondary | ICD-10-CM | POA: Diagnosis not present

## 2021-02-26 DIAGNOSIS — Z7984 Long term (current) use of oral hypoglycemic drugs: Secondary | ICD-10-CM | POA: Diagnosis not present

## 2021-02-26 DIAGNOSIS — E119 Type 2 diabetes mellitus without complications: Secondary | ICD-10-CM | POA: Diagnosis not present

## 2021-03-06 DIAGNOSIS — G894 Chronic pain syndrome: Secondary | ICD-10-CM | POA: Diagnosis not present

## 2021-03-06 DIAGNOSIS — M461 Sacroiliitis, not elsewhere classified: Secondary | ICD-10-CM | POA: Diagnosis not present

## 2021-03-06 DIAGNOSIS — G629 Polyneuropathy, unspecified: Secondary | ICD-10-CM | POA: Diagnosis not present

## 2021-03-06 DIAGNOSIS — M62830 Muscle spasm of back: Secondary | ICD-10-CM | POA: Diagnosis not present

## 2021-05-08 DIAGNOSIS — M461 Sacroiliitis, not elsewhere classified: Secondary | ICD-10-CM | POA: Diagnosis not present

## 2021-05-08 DIAGNOSIS — M62830 Muscle spasm of back: Secondary | ICD-10-CM | POA: Diagnosis not present

## 2021-05-08 DIAGNOSIS — G894 Chronic pain syndrome: Secondary | ICD-10-CM | POA: Diagnosis not present

## 2021-05-08 DIAGNOSIS — G629 Polyneuropathy, unspecified: Secondary | ICD-10-CM | POA: Diagnosis not present

## 2021-06-28 ENCOUNTER — Inpatient Hospital Stay: Payer: Medicare Other | Admitting: Hematology & Oncology

## 2021-06-28 ENCOUNTER — Inpatient Hospital Stay: Payer: Medicare Other | Attending: Hematology & Oncology

## 2021-06-28 ENCOUNTER — Encounter: Payer: Self-pay | Admitting: Hematology & Oncology

## 2021-06-28 ENCOUNTER — Other Ambulatory Visit: Payer: Self-pay

## 2021-06-28 ENCOUNTER — Telehealth: Payer: Self-pay | Admitting: *Deleted

## 2021-06-28 VITALS — BP 121/85 | HR 72 | Temp 97.7°F | Resp 18 | Wt 160.8 lb

## 2021-06-28 DIAGNOSIS — D472 Monoclonal gammopathy: Secondary | ICD-10-CM | POA: Diagnosis not present

## 2021-06-28 LAB — CMP (CANCER CENTER ONLY)
ALT: 16 U/L (ref 0–44)
AST: 21 U/L (ref 15–41)
Albumin: 4.5 g/dL (ref 3.5–5.0)
Alkaline Phosphatase: 54 U/L (ref 38–126)
Anion gap: 9 (ref 5–15)
BUN: 10 mg/dL (ref 8–23)
CO2: 27 mmol/L (ref 22–32)
Calcium: 10.2 mg/dL (ref 8.9–10.3)
Chloride: 100 mmol/L (ref 98–111)
Creatinine: 0.86 mg/dL (ref 0.61–1.24)
GFR, Estimated: >60 mL/min (ref >60)
Glucose, Bld: 190 mg/dL — ABNORMAL HIGH (ref 70–99)
Potassium: 3.9 mmol/L (ref 3.5–5.1)
Sodium: 136 mmol/L (ref 135–145)
Total Bilirubin: 0.6 mg/dL (ref 0.3–1.2)
Total Protein: 8 g/dL (ref 6.5–8.1)

## 2021-06-28 LAB — CBC WITH DIFFERENTIAL (CANCER CENTER ONLY)
Abs Immature Granulocytes: 0.02 10*3/uL (ref 0.00–0.07)
Basophils Absolute: 0.1 10*3/uL (ref 0.0–0.1)
Basophils Relative: 1 %
Eosinophils Absolute: 0.1 10*3/uL (ref 0.0–0.5)
Eosinophils Relative: 1 %
HCT: 42.8 % (ref 39.0–52.0)
Hemoglobin: 14 g/dL (ref 13.0–17.0)
Immature Granulocytes: 0 %
Lymphocytes Relative: 24 %
Lymphs Abs: 1.7 10*3/uL (ref 0.7–4.0)
MCH: 30.8 pg (ref 26.0–34.0)
MCHC: 32.7 g/dL (ref 30.0–36.0)
MCV: 94.1 fL (ref 80.0–100.0)
Monocytes Absolute: 0.5 10*3/uL (ref 0.1–1.0)
Monocytes Relative: 7 %
Neutro Abs: 4.6 10*3/uL (ref 1.7–7.7)
Neutrophils Relative %: 67 %
Platelet Count: 270 10*3/uL (ref 150–400)
RBC: 4.55 MIL/uL (ref 4.22–5.81)
RDW: 13 % (ref 11.5–15.5)
WBC Count: 6.9 10*3/uL (ref 4.0–10.5)
nRBC: 0 % (ref 0.0–0.2)

## 2021-06-28 LAB — LACTATE DEHYDROGENASE: LDH: 112 U/L (ref 98–192)

## 2021-06-28 NOTE — Progress Notes (Signed)
Hematology and Oncology Follow Up Visit  [name] [name] [id number] 06/23/1955 66 y.o. 06/28/2021   Principle Diagnosis:  Smoldering IgG Kappa Myeloma   Current Therapy:   Observation    Interim History:  [name] [name] is here today for follow-up.  We see him every 6 months.  He and his wife are busy taking care of a nephew who has Down's syndrome.  It is his birthday coming up soon.  He has had no problems with nausea or vomiting.  There is been no problems with bony pain.  Has chronic back issues.  He is on time-released morphine now.  He has had no change in bowel or bladder habits.  He has had no bleeding.  Para he does have a hernia in the right inguinal area.  I took a look at this.  It did not look to be all that bad.  I really cannot even see anything to reduce.  When we saw him 6 months ago, his myeloma studies looked a bit better.  The monoclonal spike was 1.2 g/dL.  His IgG level was 1640 mg/dL.  The Kappa light chain was 12.7 mg/dL.  Overall, I would have to say his performance status is ECOG 1.      Medications:  Allergies as of 06/28/2021       Reactions   Aripiprazole Other (See Comments)   Other reaction(s): leg aching   Codeine    REACTION: hives   Duloxetine    Other reaction(s): ineffective   Duloxetine Hcl Other (See Comments)   Fluoxetine    Other reaction(s): ineffective   Other Other (See Comments)   Simvastatin Other (See Comments)        Medication List        Accurate as of June 28, 2021 11:25 AM. If you have any questions, ask your nurse or doctor.          ALPRAZolam 0.5 MG tablet Commonly known as: XANAX Take 0.5 mg by mouth as directed.   amphetamine-dextroamphetamine 10 MG tablet Commonly known as: ADDERALL Take 10 mg by mouth as needed.   clopidogrel 75 MG tablet Commonly known as: PLAVIX Take 75 mg by mouth daily.   doxycycline 100 MG tablet Commonly known as: VIBRA-TABS Take 100 mg by mouth daily.   EPINEPHrine 0.3  mg/0.3 mL Soaj injection Commonly known as: EPI-PEN   escitalopram 20 MG tablet Commonly known as: LEXAPRO Take 20 mg by mouth daily.   fentaNYL 37.5 MCG/HR Pt72 Apply 1 patch topically every other day.   FLUoxetine 20 MG capsule Commonly known as: PROZAC Take 20 mg by mouth daily at 6 (six) AM.   gabapentin 300 MG capsule Commonly known as: NEURONTIN TAKE 3 TABS BY MOUTH IN THE  AM & 4 TABS BY MOUTH IN THE PM   metFORMIN 500 MG tablet Commonly known as: GLUCOPHAGE Take 1,000 mg by mouth 2 (two) times daily.   methocarbamol 500 MG tablet Commonly known as: ROBAXIN Take 500 mg by mouth 3 (three) times daily.   morphine 10 MG 24 hr capsule Commonly known as: KADIAN Take 10 mg by mouth every 8 (eight) hours as needed.   nitroGLYCERIN 0.4 MG SL tablet Commonly known as: NITROSTAT Place 1 tablet (0.4 mg total) under the tongue every 5 (five) minutes as needed for chest pain.   pantoprazole 40 MG tablet Commonly known as: PROTONIX Take 1 tablet (40 mg total) by mouth daily.   Percocet 10-325 MG tablet Generic drug: oxyCODONE-acetaminophen  Take 1 tablet by mouth 3 (three) times daily as needed.   pravastatin 40 MG tablet Commonly known as: PRAVACHOL Take 40 mg by mouth daily.   predniSONE 10 MG tablet Commonly known as: DELTASONE 10 mg as needed. Tapered dosing when needed.   simvastatin 20 MG tablet Commonly known as: ZOCOR Take 20 mg by mouth daily.   sitaGLIPtin 100 MG tablet Commonly known as: JANUVIA Take 100 mg by mouth daily.   Xtampza ER 27 MG C12a Generic drug: oxyCODONE ER Take 1 capsule by mouth every 12 (twelve) hours.   zolpidem 10 MG tablet Commonly known as: AMBIEN Take 10 mg by mouth at bedtime.        Allergies:  Allergies  Allergen Reactions   Aripiprazole Other (See Comments)    Other reaction(s): leg aching   Codeine     REACTION: hives   Duloxetine     Other reaction(s): ineffective   Duloxetine Hcl Other (See Comments)    Fluoxetine     Other reaction(s): ineffective   Other Other (See Comments)   Simvastatin Other (See Comments)    Past Medical History, Surgical history, Social history, and Family History were reviewed and updated.  Review of Systems: Review of Systems  Constitutional: Negative.   HENT: Negative.    Eyes: Negative.   Respiratory: Negative.    Cardiovascular: Negative.   Gastrointestinal: Negative.   Genitourinary: Negative.   Musculoskeletal:  Positive for back pain and myalgias.  Skin:  Positive for rash.  Neurological:  Positive for sensory change.  Psychiatric/Behavioral:  The patient is nervous/anxious.    Physical Exam:  weight is 160 lb 12.8 oz (72.9 kg). His oral temperature is 97.7 F (36.5 C). His blood pressure is 121/85 and his pulse is 72. His respiration is 18 and oxygen saturation is 99%.   Wt Readings from Last 3 Encounters:  06/28/21 160 lb 12.8 oz (72.9 kg)  12/27/20 162 lb (73.5 kg)  06/30/20 159 lb (72.1 kg)    Physical Exam Vitals reviewed.  HENT:     Head: Normocephalic and atraumatic.  Eyes:     Pupils: Pupils are equal, round, and reactive to light.  Cardiovascular:     Rate and Rhythm: Normal rate and regular rhythm.     Heart sounds: Normal heart sounds.  Pulmonary:     Effort: Pulmonary effort is normal.     Breath sounds: Normal breath sounds.  Abdominal:     General: Bowel sounds are normal.     Palpations: Abdomen is soft.  Musculoskeletal:        General: No tenderness or deformity. Normal range of motion.     Cervical back: Normal range of motion.  Lymphadenopathy:     Cervical: No cervical adenopathy.  Skin:    General: Skin is warm and dry.     Findings: No erythema or rash.  Neurological:     Mental Status: He is alert and oriented to person, place, and time.  Psychiatric:        Behavior: Behavior normal.        Thought Content: Thought content normal.        Judgment: Judgment normal.    Lab Results  Component Value  Date   WBC 6.9 06/28/2021   HGB 14.0 06/28/2021   HCT 42.8 06/28/2021   MCV 94.1 06/28/2021   PLT 270 06/28/2021   Lab Results  Component Value Date   FERRITIN 73.9 11/20/2009   IRON 76 11/20/2009  IRONPCTSAT 22.8 11/20/2009   Lab Results  Component Value Date   RBC 4.55 06/28/2021   Lab Results  Component Value Date   KPAFRELGTCHN 127.1 (H) 12/27/2020   LAMBDASER 10.5 12/27/2020   KAPLAMBRATIO 12.10 (H) 12/27/2020   Lab Results  Component Value Date   IGGSERUM 1,639 (H) 12/27/2020   IGGSERUM 1,639 (H) 12/27/2020   IGA 80 12/27/2020   IGA 86 12/27/2020   IGMSERUM 20 12/27/2020   IGMSERUM 20 12/27/2020   Lab Results  Component Value Date   TOTALPROTELP 7.2 12/27/2020   ALBUMINELP 3.9 12/27/2020   A1GS 0.2 12/27/2020   A2GS 0.8 12/27/2020   BETS 0.9 12/27/2020   GAMS 1.4 12/27/2020   MSPIKE 1.2 (H) 12/27/2020     Chemistry      Component Value Date/Time   NA 136 06/28/2021 1000   NA 145 05/07/2017 0945   NA 139 07/25/2016 0815   K 3.9 06/28/2021 1000   K 3.9 05/07/2017 0945   K 4.0 07/25/2016 0815   CL 100 06/28/2021 1000   CL 100 05/07/2017 0945   CO2 27 06/28/2021 1000   CO2 29 05/07/2017 0945   CO2 28 07/25/2016 0815   BUN 10 06/28/2021 1000   BUN 8 05/07/2017 0945   BUN 9.0 07/25/2016 0815   CREATININE 0.86 06/28/2021 1000   CREATININE 0.8 05/07/2017 0945   CREATININE 0.8 07/25/2016 0815      Component Value Date/Time   CALCIUM 10.2 06/28/2021 1000   CALCIUM 9.7 05/07/2017 0945   CALCIUM 9.6 07/25/2016 0815   ALKPHOS 54 06/28/2021 1000   ALKPHOS 60 05/07/2017 0945   ALKPHOS 57 07/25/2016 0815   AST 21 06/28/2021 1000   AST 15 07/25/2016 0815   ALT 16 06/28/2021 1000   ALT 20 05/07/2017 0945   ALT 13 07/25/2016 0815   BILITOT 0.6 06/28/2021 1000   BILITOT 0.55 07/25/2016 0815     Impression and Plan: Mr. Allinson is a 66 yo white male with an IgG Kappa smoldering myeloma diagnosed 8 years ago. He was treated initially with Velcade and  Decadron and then Revlimid.   So far, he has done well. His last bone marrow test in March 2018 showed only 7-10% plasma cells.   We like to see what his myeloma studies show.  Hopefully they will be stable.  I would like to think that every 86-monthfollow-up is appropriate.  I really do not think that we have to get back sooner unless we see a marked change in his myeloma studies.  If we do see a change in the myeloma studies, then we will have to think about another bone marrow biopsy on him.    1/26/202311:25 AM

## 2021-06-28 NOTE — Telephone Encounter (Signed)
Per 06/28/21 los - gave upcoming appointments - confirmed

## 2021-06-29 LAB — KAPPA/LAMBDA LIGHT CHAINS
Kappa free light chain: 180.7 mg/L — ABNORMAL HIGH (ref 3.3–19.4)
Kappa, lambda light chain ratio: 15.44 — ABNORMAL HIGH (ref 0.26–1.65)
Lambda free light chains: 11.7 mg/L (ref 5.7–26.3)

## 2021-06-29 LAB — IGG, IGA, IGM
IgA: 83 mg/dL (ref 61–437)
IgG (Immunoglobin G), Serum: 1639 mg/dL — ABNORMAL HIGH (ref 603–1613)
IgM (Immunoglobulin M), Srm: 19 mg/dL — ABNORMAL LOW (ref 20–172)

## 2021-07-04 LAB — PROTEIN ELECTROPHORESIS, SERUM, WITH REFLEX
A/G Ratio: 1 (ref 0.7–1.7)
Albumin ELP: 3.8 g/dL (ref 2.9–4.4)
Alpha-1-Globulin: 0.3 g/dL (ref 0.0–0.4)
Alpha-2-Globulin: 0.9 g/dL (ref 0.4–1.0)
Beta Globulin: 1 g/dL (ref 0.7–1.3)
Gamma Globulin: 1.6 g/dL (ref 0.4–1.8)
Globulin, Total: 3.7 g/dL (ref 2.2–3.9)
M-Spike, %: 1.3 g/dL — ABNORMAL HIGH
SPEP Interpretation: 0
Total Protein ELP: 7.5 g/dL (ref 6.0–8.5)

## 2021-07-04 LAB — IMMUNOFIXATION REFLEX, SERUM

## 2021-07-12 DIAGNOSIS — M461 Sacroiliitis, not elsewhere classified: Secondary | ICD-10-CM | POA: Diagnosis not present

## 2021-07-12 DIAGNOSIS — G894 Chronic pain syndrome: Secondary | ICD-10-CM | POA: Diagnosis not present

## 2021-07-12 DIAGNOSIS — G629 Polyneuropathy, unspecified: Secondary | ICD-10-CM | POA: Diagnosis not present

## 2021-07-12 DIAGNOSIS — M62830 Muscle spasm of back: Secondary | ICD-10-CM | POA: Diagnosis not present

## 2021-09-05 ENCOUNTER — Other Ambulatory Visit: Payer: Self-pay | Admitting: Family Medicine

## 2021-09-05 DIAGNOSIS — M549 Dorsalgia, unspecified: Secondary | ICD-10-CM | POA: Diagnosis not present

## 2021-09-05 DIAGNOSIS — K5909 Other constipation: Secondary | ICD-10-CM | POA: Diagnosis not present

## 2021-09-05 DIAGNOSIS — Z0000 Encounter for general adult medical examination without abnormal findings: Secondary | ICD-10-CM | POA: Diagnosis not present

## 2021-09-05 DIAGNOSIS — R1310 Dysphagia, unspecified: Secondary | ICD-10-CM

## 2021-09-05 DIAGNOSIS — K469 Unspecified abdominal hernia without obstruction or gangrene: Secondary | ICD-10-CM | POA: Diagnosis not present

## 2021-09-05 DIAGNOSIS — R5382 Chronic fatigue, unspecified: Secondary | ICD-10-CM | POA: Diagnosis not present

## 2021-09-05 DIAGNOSIS — E1142 Type 2 diabetes mellitus with diabetic polyneuropathy: Secondary | ICD-10-CM | POA: Diagnosis not present

## 2021-09-05 DIAGNOSIS — E782 Mixed hyperlipidemia: Secondary | ICD-10-CM | POA: Diagnosis not present

## 2021-09-05 DIAGNOSIS — I2510 Atherosclerotic heart disease of native coronary artery without angina pectoris: Secondary | ICD-10-CM | POA: Diagnosis not present

## 2021-09-05 DIAGNOSIS — I119 Hypertensive heart disease without heart failure: Secondary | ICD-10-CM | POA: Diagnosis not present

## 2021-09-05 DIAGNOSIS — D472 Monoclonal gammopathy: Secondary | ICD-10-CM | POA: Diagnosis not present

## 2021-09-06 DIAGNOSIS — M461 Sacroiliitis, not elsewhere classified: Secondary | ICD-10-CM | POA: Diagnosis not present

## 2021-09-06 DIAGNOSIS — M62830 Muscle spasm of back: Secondary | ICD-10-CM | POA: Diagnosis not present

## 2021-09-06 DIAGNOSIS — G629 Polyneuropathy, unspecified: Secondary | ICD-10-CM | POA: Diagnosis not present

## 2021-09-06 DIAGNOSIS — G894 Chronic pain syndrome: Secondary | ICD-10-CM | POA: Diagnosis not present

## 2021-09-07 ENCOUNTER — Other Ambulatory Visit: Payer: Medicare Other

## 2021-09-11 ENCOUNTER — Ambulatory Visit
Admission: RE | Admit: 2021-09-11 | Discharge: 2021-09-11 | Disposition: A | Discharge status: Home / Self Care | Payer: Medicare Other | Source: Ambulatory Visit | Attending: Family Medicine | Admitting: Family Medicine

## 2021-09-11 DIAGNOSIS — R1310 Dysphagia, unspecified: Secondary | ICD-10-CM | POA: Diagnosis not present

## 2021-09-11 DIAGNOSIS — K224 Dyskinesia of esophagus: Secondary | ICD-10-CM | POA: Diagnosis not present

## 2021-10-23 DIAGNOSIS — M47816 Spondylosis without myelopathy or radiculopathy, lumbar region: Secondary | ICD-10-CM | POA: Diagnosis not present

## 2021-10-31 NOTE — Progress Notes (Signed)
CARDIOLOGY CONSULT NOTE       Patient ID: [name] [name] MRN: [id number] DOB/AGE: February 04, 1956 66 y.o.  Primary Physician: Mayra Neer, MD Primary Cardiologist: Bailey Mech  Reason for Consultation: CAD  Active Problems:   * No active hospital problems. *   HPI:  66 y.o. last seen 07-21-2018 History of CAD, HTN, HLD Distant history of stenting circumflex and IM branches Actively being RX for myeloma by Dr [name]. Sees Dr [name] for COPD Diabetes somewhat poorly controlled Last myovue 21-Jul-2013 non ischemic EF has been normal in past    Active no agnina His mother who I also cared for died July 21, 2017 He helps care for his nephew with Down's Syndrome   Narcotic dependent back pain   He has had some stress lately Relative passed and a 66 yo downs syndrome relative is now living with him and his wife Occasional SSCP "twinges"  but nothing regular   Moderate control of DM with A1c 6.8    He is retired use to [employer] All other systems reviewed and negative except as noted above  Past Medical History:  Diagnosis Date   CAD (coronary artery disease)    stents 1997-07-21 and '05; patent circ and IM stents cath. Brantley Fling 10/08 non ischemic   Chronic back pain    Depression    DM (diabetes mellitus) ([place])    GERD (gastroesophageal reflux disease)    HLD (hyperlipidemia)    Insomnia    Multiple myeloma    dx 11/11. followed by Dr. [name] in [place]   OSA (obstructive sleep apnea)    AHI 5 from home sleep test 10/05/09   Peripheral neuropathy    Prostatism    RLS (restless legs syndrome)    responded to iron supplementation    Family History  Problem Relation Age of Onset   Heart disease Father    Diabetes Father    Hypertension Mother     Social History   Socioeconomic History   Marital status: Married    Spouse name: Not on file   Number of children: Not on file   Years of education: Not on file   Highest education level: Not on file  Occupational History   Not on file   Tobacco Use   Smoking status: Former   Smokeless tobacco: Never   Tobacco comments:    no intake of tobacco produtcs; does chew nicotene gum   Vaping Use   Vaping Use: Never used  Substance and Sexual Activity   Alcohol use: No   Drug use: Not on file   Sexual activity: Not on file  Other Topics Concern   Not on file  Social History Narrative   Married, no children; business owner; 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    Social Determinants of Health   Financial Resource Strain: Not on file  Food Insecurity: Not on file  Transportation Needs: Not on file  Physical Activity: Not on file  Stress: Not on file  Social Connections: Not on file  Intimate Partner Violence: Not on file    Past Surgical History:  Procedure Laterality Date   IR REMOVAL TUN ACCESS W/ PORT W/O FL MOD SED  12/11/2016   IR TRANSCATH RETRIEVAL FB INCL GUIDANCE (MS)  12/11/2016   IR US GUIDE VASC ACCESS RIGHT  12/11/2016   L4-5 epidural steriod injection     with fluoroscopic guidance   release of rt long finger A1  pulley with debridement of tenosynovitis   stent placed     95% lesion in AV circumflex that was dilated and stented with an AVE stent. also had 60% lesion in the OM-I and minor disease in LAD and RCA 02/14/98. Dr Velora Heckler cc Dr. Percival Spanish         Physical Exam: Blood pressure 132/90, pulse (!) 59, height 6' (1.829 m), weight 162 lb (73.5 kg), SpO2 98 %.    Affect appropriate Healthy:  appears stated age 18: normal Neck supple with no adenopathy JVP normal no bruits no thyromegaly Lungs clear with no wheezing and good diaphragmatic motion Heart:  S1/S2 no murmur, no rub, gallop or click PMI normal Abdomen: benighn, BS positve, no tenderness, no AAA no bruit.  No HSM or HJR Distal pulses intact with no bruits No edema Neuro non-focal Skin warm and dry No muscular weakness   Labs:   Lab Results  Component Value Date   WBC 6.9 06/28/2021   HGB 14.0 06/28/2021   HCT  42.8 06/28/2021   MCV 94.1 06/28/2021   PLT 270 06/28/2021   No results for input(s): NA, K, CL, CO2, BUN, CREATININE, CALCIUM, PROT, BILITOT, ALKPHOS, ALT, AST, GLUCOSE in the last 168 hours.  Invalid input(s): LABALBU No results found for: CKTOTAL, CKMB, CKMBINDEX, TROPONINI No results found for: CHOL No results found for: HDL No results found for: LDLCALC No results found for: TRIG No results found for: CHOLHDL No results found for: LDLDIRECT    Radiology: No results found.   EKG: 6.6/23 SR rate 59 normal    ASSESSMENT AND PLAN:   1. CAD:  Distant history of circumflex/IM stents. Last non ischemic myovue 2015 Will update exercise myovue since he has had some pain ECG today normal New nitro called in  2.  DM:  Discussed low carb diet.  Target hemoglobin A1c is 6.5 or less.  Continue current medications. 3.  HTN:  Well controlled.  Continue current medications and low sodium Dash type diet.   4.  HLD:  Continue statin labs with primary 5.  Myeloma :  F/u Ennever  BM 2018 7-10% plasma cells q 6 month f/u   6. Back Pain:  post L4-S1 fusion on [place]   Signed: Jenkins Rouge 11/06/2021, 2:12 PM

## 2021-11-01 DIAGNOSIS — G894 Chronic pain syndrome: Secondary | ICD-10-CM | POA: Diagnosis not present

## 2021-11-01 DIAGNOSIS — Z79891 Long term (current) use of opiate analgesic: Secondary | ICD-10-CM | POA: Diagnosis not present

## 2021-11-01 DIAGNOSIS — G629 Polyneuropathy, unspecified: Secondary | ICD-10-CM | POA: Diagnosis not present

## 2021-11-01 DIAGNOSIS — M62830 Muscle spasm of back: Secondary | ICD-10-CM | POA: Diagnosis not present

## 2021-11-01 DIAGNOSIS — M461 Sacroiliitis, not elsewhere classified: Secondary | ICD-10-CM | POA: Diagnosis not present

## 2021-11-06 ENCOUNTER — Ambulatory Visit: Payer: Medicare Other | Admitting: Cardiovascular Disease

## 2021-11-06 ENCOUNTER — Encounter: Payer: Self-pay | Admitting: *Deleted

## 2021-11-06 ENCOUNTER — Encounter: Payer: Self-pay | Admitting: Cardiovascular Disease

## 2021-11-06 VITALS — BP 132/90 | HR 59 | Ht 72.0 in | Wt 162.0 lb

## 2021-11-06 DIAGNOSIS — R079 Chest pain, unspecified: Secondary | ICD-10-CM

## 2021-11-06 DIAGNOSIS — I10 Essential (primary) hypertension: Secondary | ICD-10-CM | POA: Diagnosis not present

## 2021-11-06 MED ORDER — NITROGLYCERIN 0.4 MG SL SUBL: 0.4000 mg | SUBLINGUAL_TABLET | SUBLINGUAL | 3 refills | Status: AC | PRN

## 2021-11-06 NOTE — Patient Instructions (Signed)
Medication Instructions:  Your physician recommends that you continue on your current medications as directed. Please refer to the Current Medication list given to you today.  *If you need a refill on your cardiac medications before your next appointment, please call your pharmacy*   Lab Work: NONE   If you have labs (blood work) drawn today and your tests are completely normal, you will receive your results only by: [place] (if you have MyChart) OR A paper copy in the mail If you have any lab test that is abnormal or we need to change your treatment, we will call you to review the results.   Testing/Procedures: Your physician has requested that you have en exercise stress myoview. For further information please visit [email or web address]. Please follow instruction sheet, as given.    Follow-Up: At [facility], you and your health needs are our priority.  As part of our continuing mission to provide you with exceptional heart care, we have created designated Provider Care Teams.  These Care Teams include your primary Cardiologist (physician) and Advanced Practice Providers (APPs -  Physician Assistants and Nurse Practitioners) who all work together to provide you with the care you need, when you need it.  We recommend signing up for the patient portal called "MyChart".  Sign up information is provided on this After Visit Summary.  MyChart is used to connect with patients for Virtual Visits (Telemedicine).  Patients are able to view lab/test results, encounter notes, upcoming appointments, etc.  Non-urgent messages can be sent to your provider as well.   To learn more about what you can do with MyChart, go to [email or web address].    Your next appointment:   3 month(s)  The format for your next appointment:   In Person  Provider:   Jenkins Rouge, MD    Other Instructions Thank you for choosing [facility]!    Important Information About Sugar

## 2021-11-12 ENCOUNTER — Ambulatory Visit (HOSPITAL_COMMUNITY)
Admission: RE | Admit: 2021-11-12 | Discharge: 2021-11-12 | Disposition: A | Discharge status: Home / Self Care | Payer: Medicare Other | Source: Ambulatory Visit | Attending: Cardiovascular Disease | Admitting: Cardiovascular Disease

## 2021-11-12 ENCOUNTER — Encounter (HOSPITAL_BASED_OUTPATIENT_CLINIC_OR_DEPARTMENT_OTHER)
Admission: RE | Admit: 2021-11-12 | Discharge: 2021-11-12 | Disposition: A | Discharge status: Home / Self Care | Payer: Medicare Other | Source: Ambulatory Visit | Attending: Cardiovascular Disease | Admitting: Cardiovascular Disease

## 2021-11-12 DIAGNOSIS — R079 Chest pain, unspecified: Secondary | ICD-10-CM | POA: Diagnosis not present

## 2021-11-12 LAB — NM MYOCAR MULTI W/SPECT W/WALL MOTION / EF
LV dias vol: 91 mL (ref 62–150)
LV sys vol: 37 mL
Nuc Stress EF: 59 %
Peak HR: 83 {beats}/min
RATE: 0.4
Rest HR: 62 {beats}/min
Rest Nuclear Isotope Dose: 8.5 mCi
SDS: 2
SRS: 8
SSS: 10
ST Depression (mm): 0 mm
Stress Nuclear Isotope Dose: 27.5 mCi
TID: 1.26

## 2021-11-12 MED ORDER — REGADENOSON 0.4 MG/5ML IV SOLN
INTRAVENOUS | Status: AC
  Administered 2021-11-12: 0.4 mg
  Filled 2021-11-12: qty 5

## 2021-11-12 MED ORDER — TECHNETIUM TC 99M TETROFOSMIN IV KIT
30.0000 | PACK | Freq: Once | INTRAVENOUS | Status: AC | PRN
  Administered 2021-11-12: 27.5 via INTRAVENOUS

## 2021-11-12 MED ORDER — TECHNETIUM TC 99M TETROFOSMIN IV KIT
10.0000 | PACK | Freq: Once | INTRAVENOUS | Status: AC | PRN
Start: 2021-11-12 — End: 2021-11-12
  Administered 2021-11-12: 8.5 via INTRAVENOUS

## 2021-11-12 MED ORDER — SODIUM CHLORIDE FLUSH 0.9 % IV SOLN
INTRAVENOUS | Status: AC
  Administered 2021-11-12: 10 mL
  Filled 2021-11-12: qty 10

## 2021-11-13 DIAGNOSIS — M5116 Intervertebral disc disorders with radiculopathy, lumbar region: Secondary | ICD-10-CM | POA: Diagnosis not present

## 2021-11-14 ENCOUNTER — Telehealth: Payer: Self-pay | Admitting: Cardiovascular Disease

## 2021-11-14 NOTE — Telephone Encounter (Signed)
Follow Up:     Patient says he needs for somebody to please call and go over his Stress Test results with him. He saw it My Chart, but did not understand it.

## 2021-11-14 NOTE — Telephone Encounter (Signed)
[name] Hector, MD  11/12/2021  6:11 PM EDT     Low risk diaphragmatic attenuation no ishcemia EF normal    Called patient, no answer but dpr on file states can leave a detailed message. Left message explaining the above and if he has any questions he can call back.

## 2021-11-16 ENCOUNTER — Encounter: Payer: Self-pay | Admitting: *Deleted

## 2021-11-23 ENCOUNTER — Telehealth: Payer: Self-pay | Admitting: Cardiovascular Disease

## 2021-11-23 NOTE — Telephone Encounter (Signed)
Pt calling back in regards to letter he received about his results in imaging on 06/12. Requesting call back.

## 2021-12-10 DIAGNOSIS — M7918 Myalgia, other site: Secondary | ICD-10-CM | POA: Diagnosis not present

## 2021-12-10 DIAGNOSIS — M47816 Spondylosis without myelopathy or radiculopathy, lumbar region: Secondary | ICD-10-CM | POA: Diagnosis not present

## 2021-12-10 DIAGNOSIS — M546 Pain in thoracic spine: Secondary | ICD-10-CM | POA: Diagnosis not present

## 2021-12-10 DIAGNOSIS — M791 Myalgia, unspecified site: Secondary | ICD-10-CM | POA: Diagnosis not present

## 2021-12-18 DIAGNOSIS — L700 Acne vulgaris: Secondary | ICD-10-CM | POA: Diagnosis not present

## 2021-12-18 DIAGNOSIS — L02821 Furuncle of head [any part, except face]: Secondary | ICD-10-CM | POA: Diagnosis not present

## 2021-12-18 DIAGNOSIS — B9689 Other specified bacterial agents as the cause of diseases classified elsewhere: Secondary | ICD-10-CM | POA: Diagnosis not present

## 2021-12-26 ENCOUNTER — Encounter: Payer: Self-pay | Admitting: Hematology & Oncology

## 2021-12-26 ENCOUNTER — Other Ambulatory Visit: Payer: Self-pay

## 2021-12-26 ENCOUNTER — Inpatient Hospital Stay: Payer: Medicare Other | Admitting: Hematology & Oncology

## 2021-12-26 ENCOUNTER — Inpatient Hospital Stay: Payer: Medicare Other | Attending: Hematology & Oncology

## 2021-12-26 VITALS — BP 122/88 | HR 68 | Temp 97.7°F | Resp 18 | Ht 72.0 in | Wt 159.0 lb

## 2021-12-26 DIAGNOSIS — D472 Monoclonal gammopathy: Secondary | ICD-10-CM | POA: Diagnosis not present

## 2021-12-26 LAB — CMP (CANCER CENTER ONLY)
ALT: 16 U/L (ref 0–44)
AST: 20 U/L (ref 15–41)
Albumin: 4.6 g/dL (ref 3.5–5.0)
Alkaline Phosphatase: 66 U/L (ref 38–126)
Anion gap: 7 (ref 5–15)
BUN: 11 mg/dL (ref 8–23)
CO2: 29 mmol/L (ref 22–32)
Calcium: 9.5 mg/dL (ref 8.9–10.3)
Chloride: 101 mmol/L (ref 98–111)
Creatinine: 0.83 mg/dL (ref 0.61–1.24)
GFR, Estimated: >60 mL/min (ref >60)
Glucose, Bld: 159 mg/dL — ABNORMAL HIGH (ref 70–99)
Potassium: 4.2 mmol/L (ref 3.5–5.1)
Sodium: 137 mmol/L (ref 135–145)
Total Bilirubin: 0.5 mg/dL (ref 0.3–1.2)
Total Protein: 7.6 g/dL (ref 6.5–8.1)

## 2021-12-26 LAB — CBC WITH DIFFERENTIAL (CANCER CENTER ONLY)
Abs Immature Granulocytes: 0.05 10*3/uL (ref 0.00–0.07)
Basophils Absolute: 0 10*3/uL (ref 0.0–0.1)
Basophils Relative: 0 %
Eosinophils Absolute: 0.1 10*3/uL (ref 0.0–0.5)
Eosinophils Relative: 1 %
HCT: 42.8 % (ref 39.0–52.0)
Hemoglobin: 13.9 g/dL (ref 13.0–17.0)
Immature Granulocytes: 1 %
Lymphocytes Relative: 17 %
Lymphs Abs: 1.6 10*3/uL (ref 0.7–4.0)
MCH: 30.9 pg (ref 26.0–34.0)
MCHC: 32.5 g/dL (ref 30.0–36.0)
MCV: 95.1 fL (ref 80.0–100.0)
Monocytes Absolute: 0.7 10*3/uL (ref 0.1–1.0)
Monocytes Relative: 7 %
Neutro Abs: 6.8 10*3/uL (ref 1.7–7.7)
Neutrophils Relative %: 74 %
Platelet Count: 244 10*3/uL (ref 150–400)
RBC: 4.5 MIL/uL (ref 4.22–5.81)
RDW: 12.8 % (ref 11.5–15.5)
WBC Count: 9.2 10*3/uL (ref 4.0–10.5)
nRBC: 0 % (ref 0.0–0.2)

## 2021-12-26 NOTE — Progress Notes (Signed)
Hematology and Oncology Follow Up Visit  [name] [name] [id number] Jan 12, 1956 66 y.o. 12/26/2021   Principle Diagnosis:  Smoldering IgG Kappa Myeloma   Current Therapy:   Observation    Interim History:  [name] [name] is here today for follow-up.  We see him every 6 months.  He has been pretty busy.  He does help take care of of a nephew who has Down syndrome.  He has had no complaints at all.  When we last saw him, his monoclonal spike was 1.4 g/dL.  His own pretty steady.  His IgG level was 1670 mg/dL.  The Kappa light chain was little bit more elevated at 18 mg/dL.  He has had no issues with bowels or bladder.  He did have a problem with a right inguinal hernia in the past but this seems to be improving.  He has had no cough or shortness of breath.  There is been no exposure to COVID.  He has had no leg swelling.  There is been no fever.  He has had no rashes.  Overall, I would say his performance status is probably ECOG 1.     Medications:  Allergies as of 12/26/2021       Reactions   Aripiprazole Other (See Comments)    leg aching   Codeine Hives   Duloxetine Other (See Comments)   Other reaction(s): ineffective   Duloxetine Hcl Other (See Comments)   Fluoxetine Other (See Comments)   Other reaction(s): ineffective   Other Other (See Comments)   Simvastatin Other (See Comments)        Medication List        Accurate as of December 26, 2021 12:31 PM. If you have any questions, ask your nurse or doctor.          STOP taking these medications    escitalopram 20 MG tablet Commonly known as: LEXAPRO Stopped by: [name], MD   fentaNYL 37.5 MCG/HR Pt72 Stopped by: [name], MD   Xtampza ER 27 MG C12a Generic drug: oxyCODONE ER Stopped by: [name], MD       TAKE these medications    ALPRAZolam 0.5 MG tablet Commonly known as: XANAX Take 0.5 mg by mouth as directed.   amphetamine-dextroamphetamine 10 MG tablet Commonly known  as: ADDERALL Take 10 mg by mouth as needed.   clopidogrel 75 MG tablet Commonly known as: PLAVIX Take 75 mg by mouth daily.   doxycycline 100 MG tablet Commonly known as: VIBRA-TABS Take 100 mg by mouth daily.   EPINEPHrine 0.3 mg/0.3 mL Soaj injection Commonly known as: EPI-PEN   FLUoxetine 20 MG capsule Commonly known as: PROZAC Take 20 mg by mouth daily at 6 (six) AM.   gabapentin 300 MG capsule Commonly known as: NEURONTIN TAKE 3 TABS BY MOUTH IN THE  AM & 4 TABS BY MOUTH IN THE PM   metFORMIN 500 MG tablet Commonly known as: GLUCOPHAGE Take 1,000 mg by mouth 2 (two) times daily.   morphine 10 MG 24 hr capsule Commonly known as: KADIAN Take 10 mg by mouth every 8 (eight) hours as needed.   nitroGLYCERIN 0.4 MG SL tablet Commonly known as: NITROSTAT Place 1 tablet (0.4 mg total) under the tongue every 5 (five) minutes as needed for chest pain.   pantoprazole 40 MG tablet Commonly known as: PROTONIX Take 1 tablet (40 mg total) by mouth daily.   Percocet 10-325 MG tablet Generic drug: oxyCODONE-acetaminophen Take 1 tablet by  mouth 3 (three) times daily as needed.   pravastatin 40 MG tablet Commonly known as: PRAVACHOL Take 40 mg by mouth daily.   simvastatin 20 MG tablet Commonly known as: ZOCOR Take 20 mg by mouth daily.   sitaGLIPtin 100 MG tablet Commonly known as: JANUVIA Take 100 mg by mouth daily.   zolpidem 10 MG tablet Commonly known as: AMBIEN Take 10 mg by mouth at bedtime.        Allergies:  Allergies  Allergen Reactions   Aripiprazole Other (See Comments)     leg aching   Codeine Hives   Duloxetine Other (See Comments)    Other reaction(s): ineffective   Duloxetine Hcl Other (See Comments)   Fluoxetine Other (See Comments)    Other reaction(s): ineffective   Other Other (See Comments)   Simvastatin Other (See Comments)    Past Medical History, Surgical history, Social history, and Family History were reviewed and  updated.  Review of Systems: Review of Systems  Constitutional: Negative.   HENT: Negative.    Eyes: Negative.   Respiratory: Negative.    Cardiovascular: Negative.   Gastrointestinal: Negative.   Genitourinary: Negative.   Musculoskeletal:  Positive for back pain and myalgias.  Skin:  Positive for rash.  Neurological:  Positive for sensory change.  Psychiatric/Behavioral:  The patient is nervous/anxious.     Physical Exam:  height is 6' (1.829 m) and weight is 159 lb (72.1 kg). His oral temperature is 97.7 F (36.5 C). His blood pressure is 122/88 and his pulse is 68. His respiration is 18 and oxygen saturation is 100%.   Wt Readings from Last 3 Encounters:  12/26/21 159 lb (72.1 kg)  11/06/21 162 lb (73.5 kg)  06/28/21 160 lb 12.8 oz (72.9 kg)    Physical Exam Vitals reviewed.  HENT:     Head: Normocephalic and atraumatic.  Eyes:     Pupils: Pupils are equal, round, and reactive to light.  Cardiovascular:     Rate and Rhythm: Normal rate and regular rhythm.     Heart sounds: Normal heart sounds.  Pulmonary:     Effort: Pulmonary effort is normal.     Breath sounds: Normal breath sounds.  Abdominal:     General: Bowel sounds are normal.     Palpations: Abdomen is soft.  Musculoskeletal:        General: No tenderness or deformity. Normal range of motion.     Cervical back: Normal range of motion.  Lymphadenopathy:     Cervical: No cervical adenopathy.  Skin:    General: Skin is warm and dry.     Findings: No erythema or rash.  Neurological:     Mental Status: He is alert and oriented to person, place, and time.  Psychiatric:        Behavior: Behavior normal.        Thought Content: Thought content normal.        Judgment: Judgment normal.     Lab Results  Component Value Date   WBC 9.2 12/26/2021   HGB 13.9 12/26/2021   HCT 42.8 12/26/2021   MCV 95.1 12/26/2021   PLT 244 12/26/2021   Lab Results  Component Value Date   FERRITIN 73.9 11/20/2009    IRON 76 11/20/2009   IRONPCTSAT 22.8 11/20/2009   Lab Results  Component Value Date   RBC 4.50 12/26/2021   Lab Results  Component Value Date   KPAFRELGTCHN 180.7 (H) 06/28/2021   LAMBDASER 11.7 06/28/2021   KAPLAMBRATIO  15.44 (H) 06/28/2021   Lab Results  Component Value Date   IGGSERUM 1,639 (H) 06/28/2021   IGGSERUM REFERT 06/28/2021   IGA 83 06/28/2021   IGA REFERT 06/28/2021   IGMSERUM 19 (L) 06/28/2021   IGMSERUM REFERT 06/28/2021   Lab Results  Component Value Date   TOTALPROTELP 7.5 06/28/2021   ALBUMINELP 3.8 06/28/2021   A1GS 0.3 06/28/2021   A2GS 0.9 06/28/2021   BETS 1.0 06/28/2021   GAMS 1.6 06/28/2021   MSPIKE 1.3 (H) 06/28/2021     Chemistry      Component Value Date/Time   NA 137 12/26/2021 1127   NA 145 05/07/2017 0945   NA 139 07/25/2016 0815   K 4.2 12/26/2021 1127   K 3.9 05/07/2017 0945   K 4.0 07/25/2016 0815   CL 101 12/26/2021 1127   CL 100 05/07/2017 0945   CO2 29 12/26/2021 1127   CO2 29 05/07/2017 0945   CO2 28 07/25/2016 0815   BUN 11 12/26/2021 1127   BUN 8 05/07/2017 0945   BUN 9.0 07/25/2016 0815   CREATININE 0.83 12/26/2021 1127   CREATININE 0.8 05/07/2017 0945   CREATININE 0.8 07/25/2016 0815      Component Value Date/Time   CALCIUM 9.5 12/26/2021 1127   CALCIUM 9.7 05/07/2017 0945   CALCIUM 9.6 07/25/2016 0815   ALKPHOS 66 12/26/2021 1127   ALKPHOS 60 05/07/2017 0945   ALKPHOS 57 07/25/2016 0815   AST 20 12/26/2021 1127   AST 15 07/25/2016 0815   ALT 16 12/26/2021 1127   ALT 20 05/07/2017 0945   ALT 13 07/25/2016 0815   BILITOT 0.5 12/26/2021 1127   BILITOT 0.55 07/25/2016 0815     Impression and Plan: [name] [name] is a 66 yo white male with an IgG Kappa smoldering myeloma diagnosed 8 years ago. He was treated initially with Velcade and Decadron and then Revlimid.   I see no obvious progression of the myeloma.  We will see what his monoclonal studies show.  His quality life is doing quite well right now.  Again I  would be surprised if we had to initiate any therapy on him.  I still think we can follow him up in 6 months.  I know he is quite busy with his nephew.  He and his wife are doing a great job taking care of their nephew.     7/26/202312:31 PM

## 2021-12-27 DIAGNOSIS — G629 Polyneuropathy, unspecified: Secondary | ICD-10-CM | POA: Diagnosis not present

## 2021-12-27 DIAGNOSIS — G894 Chronic pain syndrome: Secondary | ICD-10-CM | POA: Diagnosis not present

## 2021-12-27 DIAGNOSIS — M62830 Muscle spasm of back: Secondary | ICD-10-CM | POA: Diagnosis not present

## 2021-12-27 DIAGNOSIS — M461 Sacroiliitis, not elsewhere classified: Secondary | ICD-10-CM | POA: Diagnosis not present

## 2021-12-27 LAB — KAPPA/LAMBDA LIGHT CHAINS
Kappa free light chain: 129.1 mg/L — ABNORMAL HIGH (ref 3.3–19.4)
Kappa, lambda light chain ratio: 13.31 — ABNORMAL HIGH (ref 0.26–1.65)
Lambda free light chains: 9.7 mg/L (ref 5.7–26.3)

## 2021-12-28 LAB — IGG, IGA, IGM
IgA: 85 mg/dL (ref 61–437)
IgG (Immunoglobin G), Serum: 1658 mg/dL — ABNORMAL HIGH (ref 603–1613)
IgM (Immunoglobulin M), Srm: 19 mg/dL — ABNORMAL LOW (ref 20–172)

## 2022-01-02 LAB — PROTEIN ELECTROPHORESIS, SERUM, WITH REFLEX
A/G Ratio: 1.1 (ref 0.7–1.7)
Albumin ELP: 3.9 g/dL (ref 2.9–4.4)
Alpha-1-Globulin: 0.2 g/dL (ref 0.0–0.4)
Alpha-2-Globulin: 0.8 g/dL (ref 0.4–1.0)
Beta Globulin: 1 g/dL (ref 0.7–1.3)
Gamma Globulin: 1.4 g/dL (ref 0.4–1.8)
Globulin, Total: 3.4 g/dL (ref 2.2–3.9)
M-Spike, %: 1.1 g/dL — ABNORMAL HIGH
SPEP Interpretation: 0
Total Protein ELP: 7.3 g/dL (ref 6.0–8.5)

## 2022-01-02 LAB — IMMUNOFIXATION REFLEX, SERUM
IgA: 96 mg/dL (ref 61–437)
IgG (Immunoglobin G), Serum: 1666 mg/dL — ABNORMAL HIGH (ref 603–1613)
IgM (Immunoglobulin M), Srm: 20 mg/dL (ref 20–172)

## 2022-02-25 DIAGNOSIS — M62830 Muscle spasm of back: Secondary | ICD-10-CM | POA: Diagnosis not present

## 2022-02-25 DIAGNOSIS — G894 Chronic pain syndrome: Secondary | ICD-10-CM | POA: Diagnosis not present

## 2022-02-25 DIAGNOSIS — M461 Sacroiliitis, not elsewhere classified: Secondary | ICD-10-CM | POA: Diagnosis not present

## 2022-02-25 DIAGNOSIS — G629 Polyneuropathy, unspecified: Secondary | ICD-10-CM | POA: Diagnosis not present

## 2022-03-11 ENCOUNTER — Ambulatory Visit: Payer: Medicare Other | Admitting: Cardiovascular Disease

## 2022-03-18 NOTE — Progress Notes (Deleted)
CARDIOLOGY CONSULT NOTE       Patient ID: [name] [name] MRN: [id number] DOB/AGE: 05-Feb-1956 66 y.o.  Primary Physician: Mayra Neer, MD Primary Cardiologist: Bailey Mech  Reason for Consultation: CAD  Active Problems:   * No active hospital problems. *   HPI:  66 y.o. last seen 07-26-2018 History of CAD, HTN, HLD Distant history of stenting circumflex and IM branches Actively being RX for myeloma by Dr [name]. Sees Dr [name] for COPD Diabetes somewhat poorly controlled Last myovue 2013-07-26 non ischemic EF has been normal in past    Active no agnina His mother who I also cared for died Jul 26, 2017 He helps care for his nephew with Down's Syndrome   Narcotic dependent back pain   He has had some stress lately Relative passed and a 66 yo downs syndrome relative is now living with him and his wife Occasional SSCP "twinges"  but nothing regular   Moderate control of DM with A1c 6.8    He is retired use to [employer] done 612/23 low risk Bowel/diaphragmatic attenuation EF 59%  ***      ROS All other systems reviewed and negative except as noted above  Past Medical History:  Diagnosis Date   CAD (coronary artery disease)    stents 1997/07/26 and '05; patent circ and IM stents cath. Brantley Fling 10/08 non ischemic   Chronic back pain    Depression    DM (diabetes mellitus) ([place])    GERD (gastroesophageal reflux disease)    HLD (hyperlipidemia)    Insomnia    Multiple myeloma    dx 11/11. followed by Dr. [name] in [place]   OSA (obstructive sleep apnea)    AHI 5 from home sleep test 10/05/09   Peripheral neuropathy    Prostatism    RLS (restless legs syndrome)    responded to iron supplementation    Family History  Problem Relation Age of Onset   Heart disease Father    Diabetes Father    Hypertension Mother     Social History   Socioeconomic History   Marital status: Married    Spouse name: Not on file   Number of children: Not on file   Years of education: Not on file    Highest education level: Not on file  Occupational History   Not on file  Tobacco Use   Smoking status: Former   Smokeless tobacco: Never   Tobacco comments:    no intake of tobacco produtcs; does chew nicotene gum   Vaping Use   Vaping Use: Never used  Substance and Sexual Activity   Alcohol use: No   Drug use: Not on file   Sexual activity: Not on file  Other Topics Concern   Not on file  Social History Narrative   Married, no children; business owner; 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    Social Determinants of Health   Financial Resource Strain: Not on file  Food Insecurity: Not on file  Transportation Needs: Not on file  Physical Activity: Not on file  Stress: Not on file  Social Connections: Not on file  Intimate Partner Violence: Not on file    Past Surgical History:  Procedure Laterality Date   IR REMOVAL TUN ACCESS W/ PORT W/O FL MOD SED  12/11/2016   IR TRANSCATH RETRIEVAL FB INCL GUIDANCE (MS)  12/11/2016   IR US GUIDE VASC ACCESS RIGHT  12/11/2016   L4-5 epidural steriod injection  with fluoroscopic guidance   release of rt long finger A1     pulley with debridement of tenosynovitis   stent placed     95% lesion in AV circumflex that was dilated and stented with an AVE stent. also had 60% lesion in the OM-I and minor disease in LAD and RCA 02/14/98. Dr Velora Heckler cc Dr. Percival Spanish         Physical Exam: There were no vitals taken for this visit.    Affect appropriate Healthy:  appears stated age 14: normal Neck supple with no adenopathy JVP normal no bruits no thyromegaly Lungs clear with no wheezing and good diaphragmatic motion Heart:  S1/S2 no murmur, no rub, gallop or click PMI normal Abdomen: benighn, BS positve, no tenderness, no AAA no bruit.  No HSM or HJR Distal pulses intact with no bruits No edema Neuro non-focal Skin warm and dry No muscular weakness   Labs:   Lab Results  Component Value Date   WBC 9.2 12/26/2021    HGB 13.9 12/26/2021   HCT 42.8 12/26/2021   MCV 95.1 12/26/2021   PLT 244 12/26/2021   No results for input(s): "NA", "K", "CL", "CO2", "BUN", "CREATININE", "CALCIUM", "PROT", "BILITOT", "ALKPHOS", "ALT", "AST", "GLUCOSE" in the last 168 hours.  Invalid input(s): "LABALBU" No results found for: "CKTOTAL", "CKMB", "CKMBINDEX", "TROPONINI" No results found for: "CHOL" No results found for: "HDL" No results found for: "LDLCALC" No results found for: "TRIG" No results found for: "CHOLHDL" No results found for: "LDLDIRECT"    Radiology: No results found.   EKG: 6.6/23 SR rate 59 normal    ASSESSMENT AND PLAN:   1. CAD:  Distant history of circumflex/IM stents. Low risk myovue 11/12/21  2.  DM:  Discussed low carb diet.  Target hemoglobin A1c is 6.5 or less.  Continue current medications. 3.  HTN:  Well controlled.  Continue current medications and low sodium Dash type diet.   4.  HLD:  Continue statin labs with primary 5.  Myeloma :  F/u Ennever  BM 2018 7-10% plasma cells q 6 month f/u   6. Back Pain:  post L4-S1 fusion on K[place]   Signed: Jenkins Rouge 03/18/2022, 5:44 PM

## 2022-03-22 ENCOUNTER — Ambulatory Visit: Payer: Medicare Other | Admitting: Cardiovascular Disease

## 2022-03-27 DIAGNOSIS — Z7984 Long term (current) use of oral hypoglycemic drugs: Secondary | ICD-10-CM | POA: Diagnosis not present

## 2022-03-27 DIAGNOSIS — E119 Type 2 diabetes mellitus without complications: Secondary | ICD-10-CM | POA: Diagnosis not present

## 2022-04-02 DIAGNOSIS — E1142 Type 2 diabetes mellitus with diabetic polyneuropathy: Secondary | ICD-10-CM | POA: Diagnosis not present

## 2022-04-02 DIAGNOSIS — I119 Hypertensive heart disease without heart failure: Secondary | ICD-10-CM | POA: Diagnosis not present

## 2022-04-02 DIAGNOSIS — E782 Mixed hyperlipidemia: Secondary | ICD-10-CM | POA: Diagnosis not present

## 2022-04-24 DIAGNOSIS — G894 Chronic pain syndrome: Secondary | ICD-10-CM | POA: Diagnosis not present

## 2022-04-24 DIAGNOSIS — Z79891 Long term (current) use of opiate analgesic: Secondary | ICD-10-CM | POA: Diagnosis not present

## 2022-04-24 DIAGNOSIS — M461 Sacroiliitis, not elsewhere classified: Secondary | ICD-10-CM | POA: Diagnosis not present

## 2022-04-24 DIAGNOSIS — M62830 Muscle spasm of back: Secondary | ICD-10-CM | POA: Diagnosis not present

## 2022-04-24 DIAGNOSIS — G629 Polyneuropathy, unspecified: Secondary | ICD-10-CM | POA: Diagnosis not present

## 2022-05-06 DIAGNOSIS — M47816 Spondylosis without myelopathy or radiculopathy, lumbar region: Secondary | ICD-10-CM | POA: Diagnosis not present

## 2022-05-06 DIAGNOSIS — M5116 Intervertebral disc disorders with radiculopathy, lumbar region: Secondary | ICD-10-CM | POA: Diagnosis not present

## 2022-05-06 DIAGNOSIS — M546 Pain in thoracic spine: Secondary | ICD-10-CM | POA: Diagnosis not present

## 2022-05-06 DIAGNOSIS — M791 Myalgia, unspecified site: Secondary | ICD-10-CM | POA: Diagnosis not present

## 2022-06-11 DIAGNOSIS — M5116 Intervertebral disc disorders with radiculopathy, lumbar region: Secondary | ICD-10-CM | POA: Diagnosis not present

## 2022-06-19 DIAGNOSIS — G629 Polyneuropathy, unspecified: Secondary | ICD-10-CM | POA: Diagnosis not present

## 2022-06-19 DIAGNOSIS — G894 Chronic pain syndrome: Secondary | ICD-10-CM | POA: Diagnosis not present

## 2022-06-19 DIAGNOSIS — M461 Sacroiliitis, not elsewhere classified: Secondary | ICD-10-CM | POA: Diagnosis not present

## 2022-06-19 DIAGNOSIS — M62830 Muscle spasm of back: Secondary | ICD-10-CM | POA: Diagnosis not present

## 2022-06-20 IMAGING — RF DG ESOPHAGUS
9 of 10 series · 14 of 24 positions shown · non-contrast
Comparison: None.

CLINICAL DATA: Dysphagia

EXAM:
ESOPHOGRAM / BARIUM SWALLOW / BARIUM TABLET STUDY
TECHNIQUE: Combined double contrast and single contrast examination performed
using effervescent crystals, thick barium liquid, and thin barium
liquid. The patient was observed with fluoroscopy swallowing a 13 mm
barium sulphate tablet.
FLUOROSCOPY:
Radiation Exposure Index (as provided by the fluoroscopic device):
22 mGy Kerma

[Series 1: sequence · 0.28mm/px · 2 of 11 frames shown (1 of 6)]
[frame 2/11]
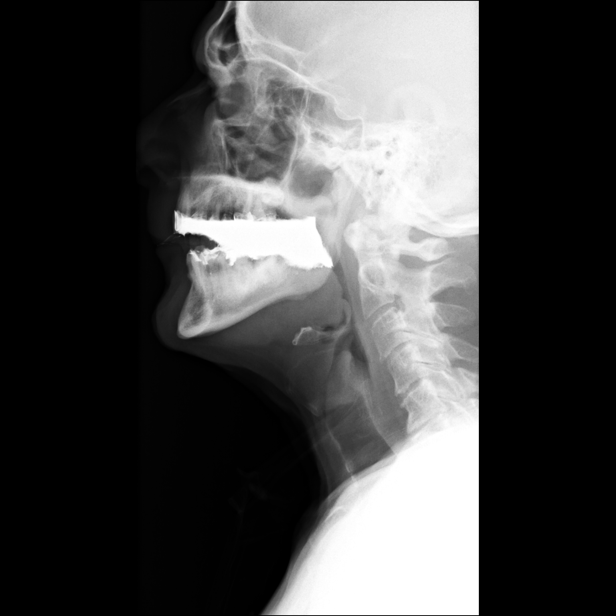
[frame 10/11]
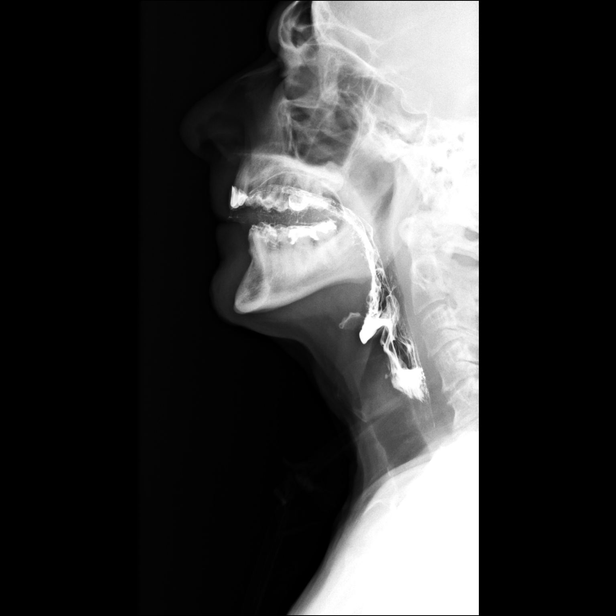

[Series 2: sequence · 0.28mm/px · 2 of 18 frames shown (2 of 6)]
[frame 6/18]
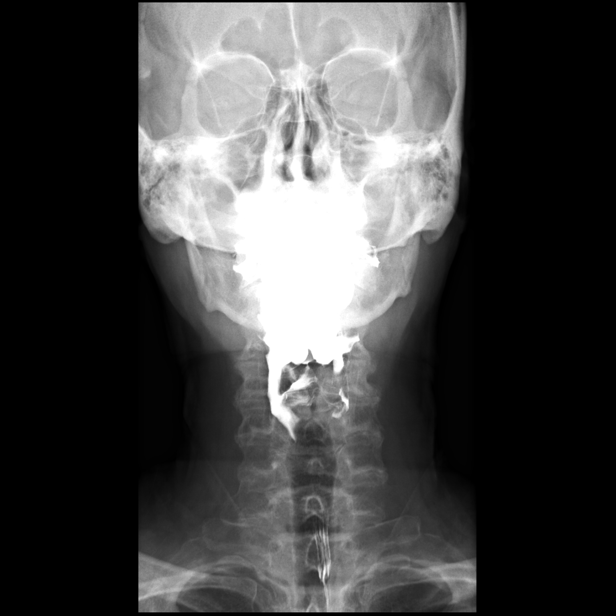
[frame 16/18]
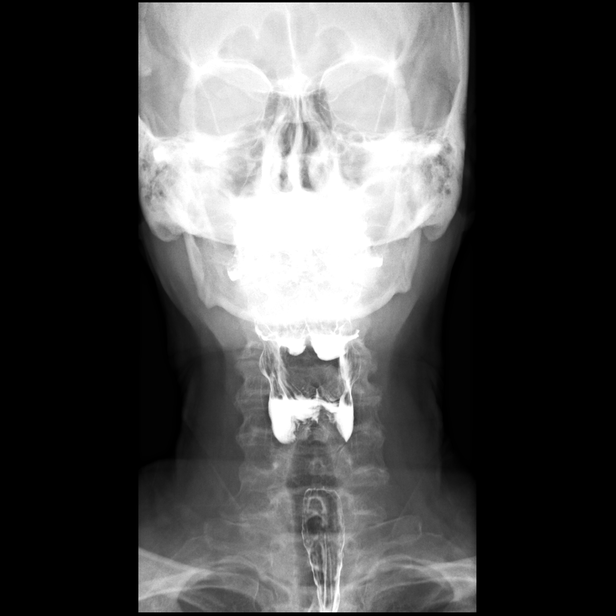

[Series 3: sequence · 0.28mm/px · 2 of 40 frames shown (3 of 6)]
[frame 3/40]
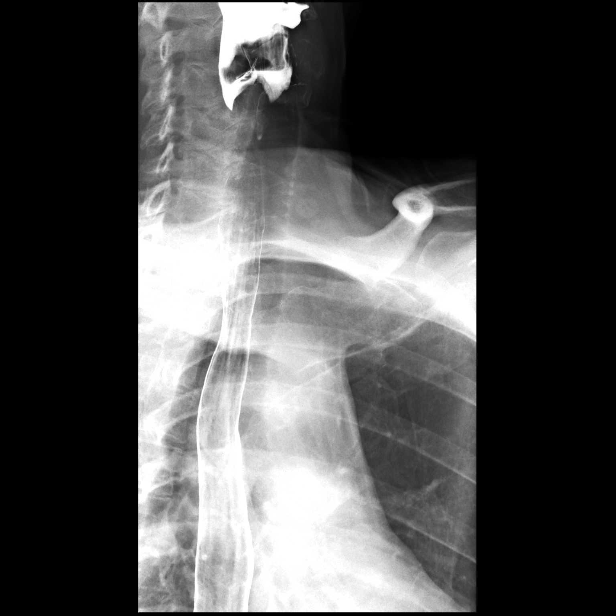
[frame 21/40]
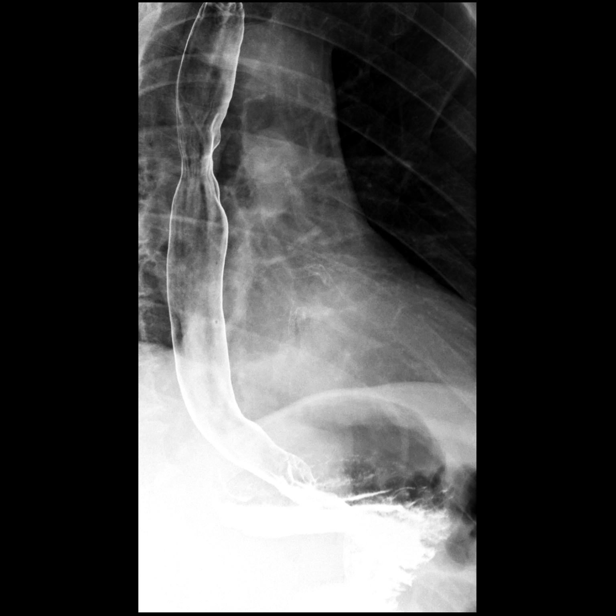

[Series 4: sequence · 0.28mm/px · 2 of 6 frames shown (4 of 6)]
[frame 1/6]
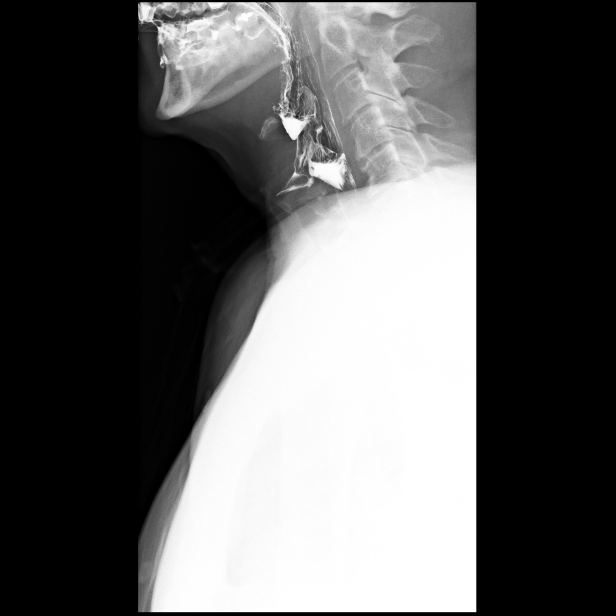
[frame 6/6]
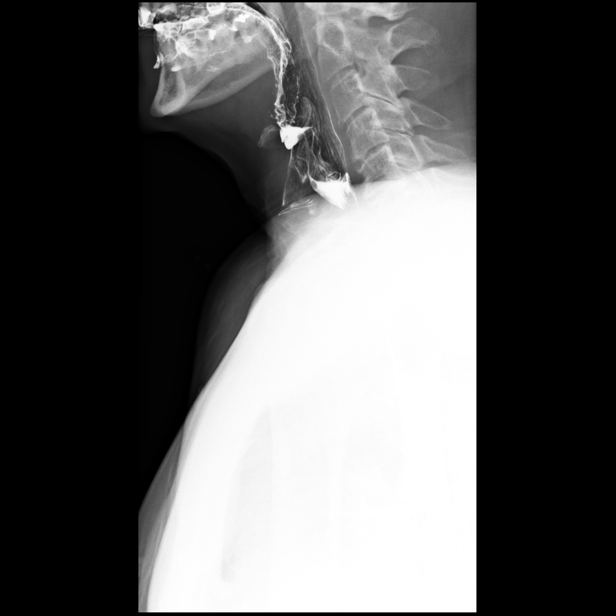

[Series 6: one shot · 1 of 1 slices shown (1 of 3)]
[im 1/1]
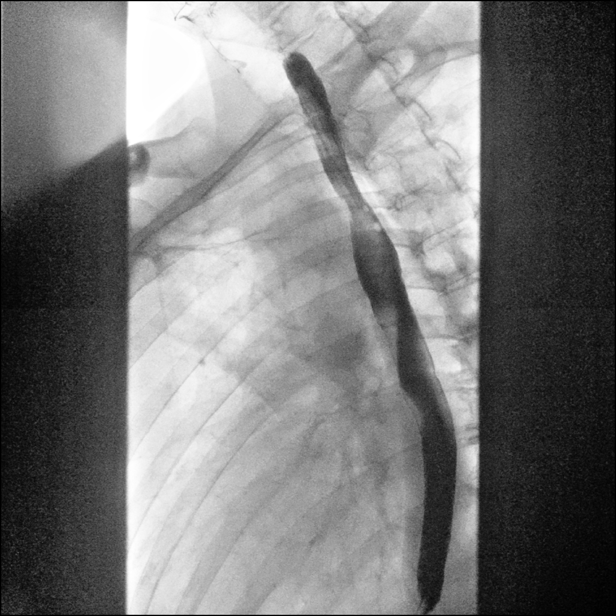

[Series 7: sequence · 2 of 65 frames shown (5 of 6)]
[frame 33/65]
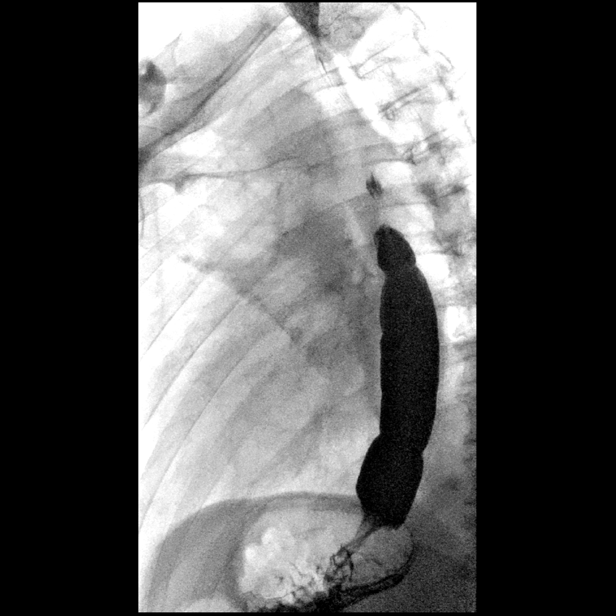
[frame 56/65]
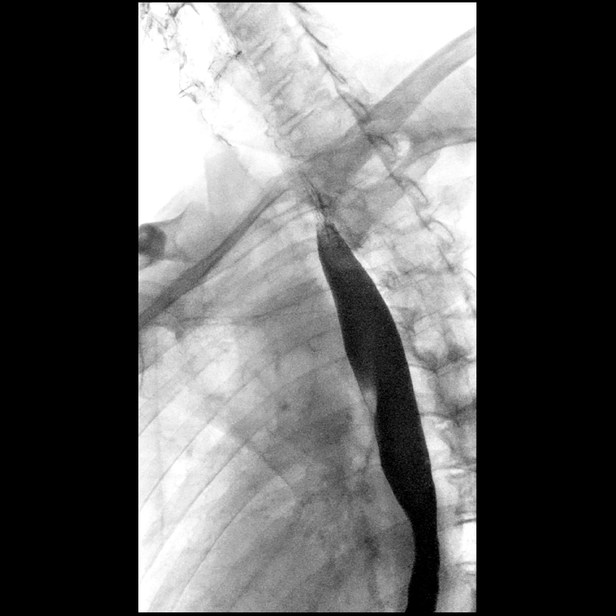

[Series 8: one shot · 1 of 1 slices shown (2 of 3)]
[im 1/1]
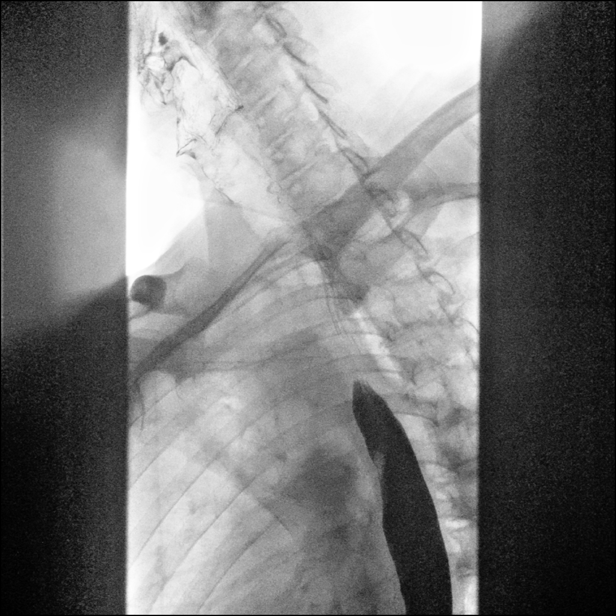

[Series 9: sequence · 1 of 49 frames shown (6 of 6)]
[frame 39/49]
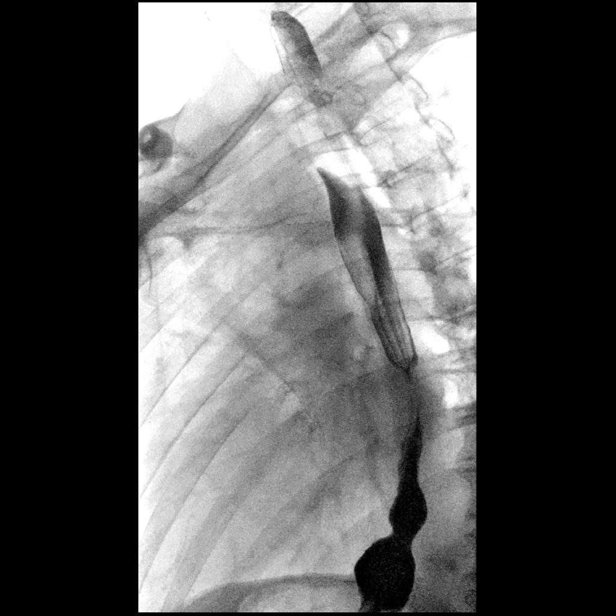

[Series 10: one shot · 1 of 1 slices shown (3 of 3)]
[im 1/1]
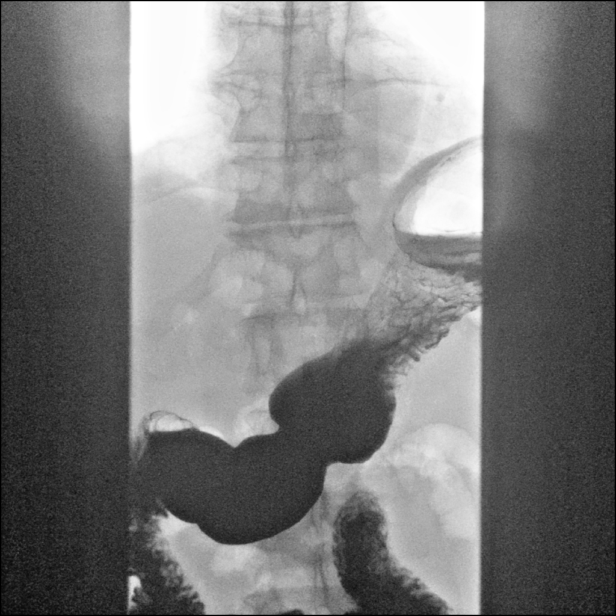

[14 of 24 positions shown; findings below may reference images not displayed]

FINDINGS: Frontal and lateral views of the hypopharynx while swallowing thick
barium are unremarkable.

Double contrast imaging of the esophagus shows no evidence for
diverticulum, stricture, mass lesion or mucosal ulceration. Patient
was noted to have a subtle cough episode towards the end of the
double contrast portion of this study. He was subsequently moved to
a lateral position in the hypopharynx was assessed, demonstrating
airway contrast at the level of and just below the vocal cords.

Patient was subsequently placed RAO prone on the fluoro table and
monitored fluoroscopically while swallowing thin barium. No
additional episodes of aspiration were observed. Disruption of
primary peristalsis was observed in the proximal to mid esophagus on
multiple swallows, consistent with nonspecific esophageal motility
disorder. No presbyesophagus.

13 mm barium tablet passes readily into the stomach when taken with
water.
IMPRESSION: 1. Single episode of mild aspiration observed during double contrast
imaging. A non vigorous cough reflex was elicited. Speech pathology
consultation may be warranted.
2. Nonspecific esophageal motility disorder.

## 2022-06-26 NOTE — Progress Notes (Signed)
CARDIOLOGY CONSULT NOTE       Patient ID: [name] [name] MRN: [id number] DOB/AGE: March 21, 1956 67 y.o.  Primary Physician: Mayra Neer, MD Primary Cardiologist: Bailey Mech  Reason for Consultation: CAD     HPI:  67 y.o. history of CAD, HTN, HLD Distant history of stenting circumflex and IM branches Actively being RX for myeloma by Dr [name]. Sees Dr [name] for COPD Diabetes somewhat poorly controlled  Myovue Aug 28, 2013 non ischemic EF has been normal in past   Myovue 11/12/21 diaphragmatic attenuation no ischemia EF 59%   Active no agnina His mother who I also cared for died 2017/08/28 He helps care for his nephew with Down's Syndrome   Narcotic dependent back pain Retired from [employer]   He has had some stress lately Relative passed and a 67 yo downs syndrome relative is now living with him and his wife    Moderate control of DM with A1c 6.8    No angina dyspnea or syncope compliant with meds     ROS All other systems reviewed and negative except as noted above  Past Medical History:  Diagnosis Date   CAD (coronary artery disease)    stents 08-28-97 and '05; patent circ and IM stents cath. Brantley Fling 10/08 non ischemic   Chronic back pain    Depression    DM (diabetes mellitus) ([place])    GERD (gastroesophageal reflux disease)    HLD (hyperlipidemia)    Insomnia    Multiple myeloma    dx 11/11. followed by Dr. [name] in [place]   OSA (obstructive sleep apnea)    AHI 5 from home sleep test 10/05/09   Peripheral neuropathy    Prostatism    RLS (restless legs syndrome)    responded to iron supplementation    Family History  Problem Relation Age of Onset   Heart disease Father    Diabetes Father    Hypertension Mother     Social History   Socioeconomic History   Marital status: Married    Spouse name: Not on file   Number of children: Not on file   Years of education: Not on file   Highest education level: Not on file  Occupational History   Not on file  Tobacco Use    Smoking status: Former   Smokeless tobacco: Never   Tobacco comments:    no intake of tobacco produtcs; does chew nicotene gum   Vaping Use   Vaping Use: Never used  Substance and Sexual Activity   Alcohol use: No   Drug use: Not on file   Sexual activity: Not on file  Other Topics Concern   Not on file  Social History Narrative   Married, no children; business owner; 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    Social Determinants of Health   Financial Resource Strain: Not on file  Food Insecurity: Not on file  Transportation Needs: Not on file  Physical Activity: Not on file  Stress: Not on file  Social Connections: Not on file  Intimate Partner Violence: Not on file    Past Surgical History:  Procedure Laterality Date   IR REMOVAL TUN ACCESS W/ PORT W/O FL MOD SED  12/11/2016   IR TRANSCATH RETRIEVAL FB INCL GUIDANCE (MS)  12/11/2016   IR US GUIDE VASC ACCESS RIGHT  12/11/2016   L4-5 epidural steriod injection     with fluoroscopic guidance   release of rt long finger A1     pulley with debridement  of tenosynovitis   stent placed     95% lesion in AV circumflex that was dilated and stented with an AVE stent. also had 60% lesion in the OM-I and minor disease in LAD and RCA 02/14/98. Dr Velora Heckler cc Dr. Percival Spanish         Physical Exam: Blood pressure (!) 140/98, pulse 70, height 6' (1.829 m), weight 168 lb (76.2 kg), SpO2 97 %.    Affect appropriate Healthy:  appears stated age 74: normal Neck supple with no adenopathy JVP normal no bruits no thyromegaly Lungs clear with no wheezing and good diaphragmatic motion Heart:  S1/S2 no murmur, no rub, gallop or click PMI normal Abdomen: benighn, BS positve, no tenderness, no AAA no bruit.  No HSM or HJR Distal pulses intact with no bruits No edema Neuro non-focal Skin warm and dry No muscular weakness   Labs:   Lab Results  Component Value Date   WBC 5.7 06/27/2022   HGB 12.2 (L) 06/27/2022   HCT 36.9 (L)  06/27/2022   MCV 95.3 06/27/2022   PLT 200 06/27/2022   No results for input(s): "NA", "K", "CL", "CO2", "BUN", "CREATININE", "CALCIUM", "PROT", "BILITOT", "ALKPHOS", "ALT", "AST", "GLUCOSE" in the last 168 hours.  Invalid input(s): "LABALBU" No results found for: "CKTOTAL", "CKMB", "CKMBINDEX", "TROPONINI" No results found for: "CHOL" No results found for: "HDL" No results found for: "LDLCALC" No results found for: "TRIG" No results found for: "CHOLHDL" No results found for: "LDLDIRECT"    Radiology: No results found.   EKG: 6.6/23 SR rate 59 normal    ASSESSMENT AND PLAN:   1. CAD:  Distant history of circumflex/IM stents. Last non ischemic myovue 11/12/21 ECG today normal New nitro called in  2.  DM:  Discussed low carb diet.  Target hemoglobin A1c is 6.5 or less.  Continue current medications. 3.  HTN:  Well controlled.  Continue current medications and low sodium Dash type diet.   4.  HLD:  Continue statin labs with primary 5.  Myeloma :  F/u Ennever  Myeloma level steady at 1.1  6. Back Pain:  post L4-S1 fusion on [place]   Signed: Jenkins Rouge 07/05/2022, 10:37 AM

## 2022-06-27 ENCOUNTER — Inpatient Hospital Stay: Payer: Medicare Other | Admitting: Hematology & Oncology

## 2022-06-27 ENCOUNTER — Other Ambulatory Visit: Payer: Self-pay

## 2022-06-27 ENCOUNTER — Encounter: Payer: Self-pay | Admitting: Hematology & Oncology

## 2022-06-27 ENCOUNTER — Inpatient Hospital Stay: Payer: Medicare Other | Attending: Hematology & Oncology

## 2022-06-27 VITALS — BP 119/81 | HR 62 | Temp 98.3°F | Resp 18 | Ht 72.0 in | Wt 167.0 lb

## 2022-06-27 DIAGNOSIS — D472 Monoclonal gammopathy: Secondary | ICD-10-CM | POA: Diagnosis not present

## 2022-06-27 LAB — CMP (CANCER CENTER ONLY)
ALT: 14 U/L (ref 0–44)
AST: 18 U/L (ref 15–41)
Albumin: 4.4 g/dL (ref 3.5–5.0)
Alkaline Phosphatase: 69 U/L (ref 38–126)
Anion gap: 6 (ref 5–15)
BUN: 10 mg/dL (ref 8–23)
CO2: 32 mmol/L (ref 22–32)
Calcium: 9.7 mg/dL (ref 8.9–10.3)
Chloride: 97 mmol/L — ABNORMAL LOW (ref 98–111)
Creatinine: 1.01 mg/dL (ref 0.61–1.24)
GFR, Estimated: >60 mL/min (ref >60)
Glucose, Bld: 189 mg/dL — ABNORMAL HIGH (ref 70–99)
Potassium: 4.1 mmol/L (ref 3.5–5.1)
Sodium: 135 mmol/L (ref 135–145)
Total Bilirubin: 0.5 mg/dL (ref 0.3–1.2)
Total Protein: 7.3 g/dL (ref 6.5–8.1)

## 2022-06-27 LAB — CBC WITH DIFFERENTIAL (CANCER CENTER ONLY)
Abs Immature Granulocytes: 0.02 10*3/uL (ref 0.00–0.07)
Basophils Absolute: 0.1 10*3/uL (ref 0.0–0.1)
Basophils Relative: 1 %
Eosinophils Absolute: 0.1 10*3/uL (ref 0.0–0.5)
Eosinophils Relative: 2 %
HCT: 36.9 % — ABNORMAL LOW (ref 39.0–52.0)
Hemoglobin: 12.2 g/dL — ABNORMAL LOW (ref 13.0–17.0)
Immature Granulocytes: 0 %
Lymphocytes Relative: 35 %
Lymphs Abs: 2 10*3/uL (ref 0.7–4.0)
MCH: 31.5 pg (ref 26.0–34.0)
MCHC: 33.1 g/dL (ref 30.0–36.0)
MCV: 95.3 fL (ref 80.0–100.0)
Monocytes Absolute: 0.6 10*3/uL (ref 0.1–1.0)
Monocytes Relative: 11 %
Neutro Abs: 3 10*3/uL (ref 1.7–7.7)
Neutrophils Relative %: 51 %
Platelet Count: 200 10*3/uL (ref 150–400)
RBC: 3.87 MIL/uL — ABNORMAL LOW (ref 4.22–5.81)
RDW: 12.3 % (ref 11.5–15.5)
WBC Count: 5.7 10*3/uL (ref 4.0–10.5)
nRBC: 0 % (ref 0.0–0.2)

## 2022-06-27 LAB — LACTATE DEHYDROGENASE: LDH: 117 U/L (ref 98–192)

## 2022-06-27 LAB — SAVE SMEAR(SSMR), FOR PROVIDER SLIDE REVIEW

## 2022-06-27 NOTE — Progress Notes (Signed)
Hematology and Oncology Follow Up Visit  DAILEY BUCCHERI [id number] 06/14/1955 67 y.o. 06/27/2022   Principle Diagnosis:  Smoldering IgG Kappa Myeloma   Current Therapy:   Observation    Interim History:  Mr. Arnone is here today for follow-up.  We see him every 6 months.  He is doing okay.  He is wife are taking care of a cousin I think who has Down syndrome.  He is 67 years old.  Mr. Koskinen is seen about a memory care center for him since he is starting to have some memory issues.  Mr. Lia and his wife did go out [place] in October.  They went to see some [place] parks.  Had a wonderful time.  He is doing well.  His last monoclonal spike back in July was down to 1.1 g/dL.  His IgG level was holding steady at 660 mg/dL.  The Kappa light chain was 12.9 mg/dL.  He has had no problems with COVID or Influenza.  There is been no change in bowel or bladder habits.  He has had no cough or shortness of breath.  He has had no nausea or vomiting.  He has had no bleeding.  There has been no leg swelling.  He has had no rashes.  Overall, I would say that his performance status is ECOG 1.       Medications:  Allergies as of 06/27/2022       Reactions   Aripiprazole Other (See Comments)    leg aching   Codeine Hives   Duloxetine Other (See Comments)   Other reaction(s): ineffective   Duloxetine Hcl Other (See Comments)   Fluoxetine Other (See Comments)   Other reaction(s): ineffective   Other Other (See Comments)   Simvastatin Other (See Comments)        Medication List        Accurate as of June 27, 2022 12:50 PM. If you have any questions, ask your nurse or doctor.          STOP taking these medications    doxycycline 100 MG tablet Commonly known as: VIBRA-TABS Stopped by: [name], MD       TAKE these medications    ALPRAZolam 0.5 MG tablet Commonly known as: XANAX Take 0.5 mg by mouth as directed.   amphetamine-dextroamphetamine 10 MG  tablet Commonly known as: ADDERALL Take 10 mg by mouth as needed.   clopidogrel 75 MG tablet Commonly known as: PLAVIX Take 75 mg by mouth daily.   EPINEPHrine 0.3 mg/0.3 mL Soaj injection Commonly known as: EPI-PEN   FLUoxetine 20 MG capsule Commonly known as: PROZAC Take 20 mg by mouth daily at 6 (six) AM.   gabapentin 300 MG capsule Commonly known as: NEURONTIN TAKE 3 TABS BY MOUTH IN THE  AM & 4 TABS BY MOUTH IN THE PM   metFORMIN 500 MG tablet Commonly known as: GLUCOPHAGE Take 1,000 mg by mouth 2 (two) times daily.   morphine 10 MG 24 hr capsule Commonly known as: KADIAN Take 10 mg by mouth every 8 (eight) hours as needed.   nitroGLYCERIN 0.4 MG SL tablet Commonly known as: NITROSTAT Place 1 tablet (0.4 mg total) under the tongue every 5 (five) minutes as needed for chest pain.   pantoprazole 40 MG tablet Commonly known as: PROTONIX Take 1 tablet (40 mg total) by mouth daily.   Percocet 10-325 MG tablet Generic drug: oxyCODONE-acetaminophen Take 1 tablet by mouth 3 (three) times daily as needed.  pravastatin 40 MG tablet Commonly known as: PRAVACHOL Take 40 mg by mouth daily.   simvastatin 20 MG tablet Commonly known as: ZOCOR Take 20 mg by mouth daily.   sitaGLIPtin 100 MG tablet Commonly known as: JANUVIA Take 100 mg by mouth daily.   sulfamethoxazole-trimethoprim 800-160 MG tablet Commonly known as: BACTRIM DS Take 1 tablet by mouth 2 (two) times daily.   zolpidem 10 MG tablet Commonly known as: AMBIEN Take 10 mg by mouth at bedtime.        Allergies:  Allergies  Allergen Reactions   Aripiprazole Other (See Comments)     leg aching   Codeine Hives   Duloxetine Other (See Comments)    Other reaction(s): ineffective   Duloxetine Hcl Other (See Comments)   Fluoxetine Other (See Comments)    Other reaction(s): ineffective   Other Other (See Comments)   Simvastatin Other (See Comments)    Past Medical History, Surgical history, Social  history, and Family History were reviewed and updated.  Review of Systems: Review of Systems  Constitutional: Negative.   HENT: Negative.    Eyes: Negative.   Respiratory: Negative.    Cardiovascular: Negative.   Gastrointestinal: Negative.   Genitourinary: Negative.   Musculoskeletal:  Positive for back pain and myalgias.  Skin:  Positive for rash.  Neurological:  Positive for sensory change.  Psychiatric/Behavioral:  The patient is nervous/anxious.     Physical Exam:  height is 6' (1.829 m) and weight is 167 lb (75.8 kg). His oral temperature is 98.3 F (36.8 C). His blood pressure is 119/81 and his pulse is 62. His respiration is 18 and oxygen saturation is 100%.   Wt Readings from Last 3 Encounters:  06/27/22 167 lb (75.8 kg)  12/26/21 159 lb (72.1 kg)  11/06/21 162 lb (73.5 kg)    Physical Exam Vitals reviewed.  HENT:     Head: Normocephalic and atraumatic.  Eyes:     Pupils: Pupils are equal, round, and reactive to light.  Cardiovascular:     Rate and Rhythm: Normal rate and regular rhythm.     Heart sounds: Normal heart sounds.  Pulmonary:     Effort: Pulmonary effort is normal.     Breath sounds: Normal breath sounds.  Abdominal:     General: Bowel sounds are normal.     Palpations: Abdomen is soft.  Musculoskeletal:        General: No tenderness or deformity. Normal range of motion.     Cervical back: Normal range of motion.  Lymphadenopathy:     Cervical: No cervical adenopathy.  Skin:    General: Skin is warm and dry.     Findings: No erythema or rash.  Neurological:     Mental Status: He is alert and oriented to person, place, and time.  Psychiatric:        Behavior: Behavior normal.        Thought Content: Thought content normal.        Judgment: Judgment normal.     Lab Results  Component Value Date   WBC 5.7 06/27/2022   HGB 12.2 (L) 06/27/2022   HCT 36.9 (L) 06/27/2022   MCV 95.3 06/27/2022   PLT 200 06/27/2022   Lab Results   Component Value Date   FERRITIN 73.9 11/20/2009   IRON 76 11/20/2009   IRONPCTSAT 22.8 11/20/2009   Lab Results  Component Value Date   RBC 3.87 (L) 06/27/2022   Lab Results  Component Value Date  KPAFRELGTCHN 129.1 (H) 12/26/2021   LAMBDASER 9.7 12/26/2021   KAPLAMBRATIO 13.31 (H) 12/26/2021   Lab Results  Component Value Date   IGGSERUM 1,658 (H) 12/26/2021   IGGSERUM 1,666 (H) 12/26/2021   IGA 85 12/26/2021   IGA 96 12/26/2021   IGMSERUM 19 (L) 12/26/2021   IGMSERUM 20 12/26/2021   Lab Results  Component Value Date   TOTALPROTELP 7.3 12/26/2021   ALBUMINELP 3.9 12/26/2021   A1GS 0.2 12/26/2021   A2GS 0.8 12/26/2021   BETS 1.0 12/26/2021   GAMS 1.4 12/26/2021   MSPIKE 1.1 (H) 12/26/2021     Chemistry      Component Value Date/Time   NA 135 06/27/2022 1147   NA 145 05/07/2017 0945   NA 139 07/25/2016 0815   K 4.1 06/27/2022 1147   K 3.9 05/07/2017 0945   K 4.0 07/25/2016 0815   CL 97 (L) 06/27/2022 1147   CL 100 05/07/2017 0945   CO2 32 06/27/2022 1147   CO2 29 05/07/2017 0945   CO2 28 07/25/2016 0815   BUN 10 06/27/2022 1147   BUN 8 05/07/2017 0945   BUN 9.0 07/25/2016 0815   CREATININE 1.01 06/27/2022 1147   CREATININE 0.8 05/07/2017 0945   CREATININE 0.8 07/25/2016 0815      Component Value Date/Time   CALCIUM 9.7 06/27/2022 1147   CALCIUM 9.7 05/07/2017 0945   CALCIUM 9.6 07/25/2016 0815   ALKPHOS 69 06/27/2022 1147   ALKPHOS 60 05/07/2017 0945   ALKPHOS 57 07/25/2016 0815   AST 18 06/27/2022 1147   AST 15 07/25/2016 0815   ALT 14 06/27/2022 1147   ALT 20 05/07/2017 0945   ALT 13 07/25/2016 0815   BILITOT 0.5 06/27/2022 1147   BILITOT 0.55 07/25/2016 0815     Impression and Plan: Mr. Rothermel is a 67 yo white male with an IgG Kappa smoldering myeloma diagnosed 9 years ago. He was treated initially with Velcade and Decadron and then Revlimid.   I see no obvious progression of the myeloma.  We will see what his monoclonal studies  show.  His quality life is doing quite well right now.  Again I would be surprised if we had to initiate any therapy on him.  I still think we can follow him up in 6 months.  I know he is quite busy with his nephew.  He and his wife are doing a great job taking care of their nephew.     1/25/202412:50 PM

## 2022-06-28 LAB — KAPPA/LAMBDA LIGHT CHAINS
Kappa free light chain: 133.2 mg/L — ABNORMAL HIGH (ref 3.3–19.4)
Kappa, lambda light chain ratio: 14.48 — ABNORMAL HIGH (ref 0.26–1.65)
Lambda free light chains: 9.2 mg/L (ref 5.7–26.3)

## 2022-06-29 LAB — IGG, IGA, IGM
IgA: 67 mg/dL (ref 61–437)
IgG (Immunoglobin G), Serum: 1379 mg/dL (ref 603–1613)
IgM (Immunoglobulin M), Srm: 15 mg/dL — ABNORMAL LOW (ref 20–172)

## 2022-07-05 ENCOUNTER — Ambulatory Visit: Payer: Medicare Other | Attending: Cardiovascular Disease | Admitting: Cardiovascular Disease

## 2022-07-05 ENCOUNTER — Encounter: Payer: Self-pay | Admitting: Cardiovascular Disease

## 2022-07-05 VITALS — BP 140/98 | HR 70 | Ht 72.0 in | Wt 168.0 lb

## 2022-07-05 DIAGNOSIS — E782 Mixed hyperlipidemia: Secondary | ICD-10-CM

## 2022-07-05 DIAGNOSIS — I2510 Atherosclerotic heart disease of native coronary artery without angina pectoris: Secondary | ICD-10-CM | POA: Diagnosis not present

## 2022-07-05 DIAGNOSIS — R079 Chest pain, unspecified: Secondary | ICD-10-CM

## 2022-07-05 DIAGNOSIS — I10 Essential (primary) hypertension: Secondary | ICD-10-CM

## 2022-07-05 NOTE — Patient Instructions (Signed)
Medication Instructions:  Your physician recommends that you continue on your current medications as directed. Please refer to the Current Medication list given to you today.   Labwork: None  Testing/Procedures: None  Follow-Up: Follow up with Dr. Nishan in 1 year.   Any Other Special Instructions Will Be Listed Below (If Applicable).     If you need a refill on your cardiac medications before your next appointment, please call your pharmacy.  

## 2022-07-08 ENCOUNTER — Encounter: Payer: Self-pay | Admitting: *Deleted

## 2022-07-08 LAB — PROTEIN ELECTROPHORESIS, SERUM, WITH REFLEX
A/G Ratio: 1.3 (ref 0.7–1.7)
Albumin ELP: 3.9 g/dL (ref 2.9–4.4)
Alpha-1-Globulin: 0.2 g/dL (ref 0.0–0.4)
Alpha-2-Globulin: 0.7 g/dL (ref 0.4–1.0)
Beta Globulin: 0.9 g/dL (ref 0.7–1.3)
Gamma Globulin: 1.2 g/dL (ref 0.4–1.8)
Globulin, Total: 3.1 g/dL (ref 2.2–3.9)
M-Spike, %: 0.9 g/dL — ABNORMAL HIGH
SPEP Interpretation: 0
Total Protein ELP: 7 g/dL (ref 6.0–8.5)

## 2022-07-08 LAB — IMMUNOFIXATION REFLEX, SERUM
IgA: 92 mg/dL (ref 61–437)
IgG (Immunoglobin G), Serum: 1979 mg/dL — ABNORMAL HIGH (ref 603–1613)
IgM (Immunoglobulin M), Srm: 15 mg/dL — ABNORMAL LOW (ref 20–172)

## 2022-07-09 ENCOUNTER — Other Ambulatory Visit: Payer: Self-pay | Admitting: Rehabilitation

## 2022-07-09 DIAGNOSIS — M5116 Intervertebral disc disorders with radiculopathy, lumbar region: Secondary | ICD-10-CM | POA: Diagnosis not present

## 2022-07-09 DIAGNOSIS — M47816 Spondylosis without myelopathy or radiculopathy, lumbar region: Secondary | ICD-10-CM

## 2022-08-05 ENCOUNTER — Inpatient Hospital Stay: Admission: RE | Admit: 2022-08-05 | Payer: Medicare Other | Source: Ambulatory Visit

## 2022-08-14 DIAGNOSIS — G629 Polyneuropathy, unspecified: Secondary | ICD-10-CM | POA: Diagnosis not present

## 2022-08-14 DIAGNOSIS — Z79891 Long term (current) use of opiate analgesic: Secondary | ICD-10-CM | POA: Diagnosis not present

## 2022-08-14 DIAGNOSIS — G894 Chronic pain syndrome: Secondary | ICD-10-CM | POA: Diagnosis not present

## 2022-08-14 DIAGNOSIS — M62830 Muscle spasm of back: Secondary | ICD-10-CM | POA: Diagnosis not present

## 2022-08-14 DIAGNOSIS — M461 Sacroiliitis, not elsewhere classified: Secondary | ICD-10-CM | POA: Diagnosis not present

## 2022-09-13 DIAGNOSIS — E1142 Type 2 diabetes mellitus with diabetic polyneuropathy: Secondary | ICD-10-CM | POA: Diagnosis not present

## 2022-09-13 DIAGNOSIS — L719 Rosacea, unspecified: Secondary | ICD-10-CM | POA: Diagnosis not present

## 2022-09-13 DIAGNOSIS — I119 Hypertensive heart disease without heart failure: Secondary | ICD-10-CM | POA: Diagnosis not present

## 2022-09-13 DIAGNOSIS — Z0000 Encounter for general adult medical examination without abnormal findings: Secondary | ICD-10-CM | POA: Diagnosis not present

## 2022-09-13 DIAGNOSIS — K5909 Other constipation: Secondary | ICD-10-CM | POA: Diagnosis not present

## 2022-09-13 DIAGNOSIS — C9000 Multiple myeloma not having achieved remission: Secondary | ICD-10-CM | POA: Diagnosis not present

## 2022-09-13 DIAGNOSIS — E782 Mixed hyperlipidemia: Secondary | ICD-10-CM | POA: Diagnosis not present

## 2022-09-13 DIAGNOSIS — I2510 Atherosclerotic heart disease of native coronary artery without angina pectoris: Secondary | ICD-10-CM | POA: Diagnosis not present

## 2022-09-13 DIAGNOSIS — R5382 Chronic fatigue, unspecified: Secondary | ICD-10-CM | POA: Diagnosis not present

## 2022-09-13 LAB — LAB REPORT - SCANNED
A1c: 7.8
Creatinine, POC: 118 mg/dL
EGFR: 87
Microalb Creat Ratio: 5.9
Microalbumin, Urine: 0.7

## 2022-09-19 DIAGNOSIS — M9904 Segmental and somatic dysfunction of sacral region: Secondary | ICD-10-CM | POA: Diagnosis not present

## 2022-09-19 DIAGNOSIS — M47816 Spondylosis without myelopathy or radiculopathy, lumbar region: Secondary | ICD-10-CM | POA: Diagnosis not present

## 2022-10-09 DIAGNOSIS — G894 Chronic pain syndrome: Secondary | ICD-10-CM | POA: Diagnosis not present

## 2022-10-09 DIAGNOSIS — G629 Polyneuropathy, unspecified: Secondary | ICD-10-CM | POA: Diagnosis not present

## 2022-10-09 DIAGNOSIS — M62830 Muscle spasm of back: Secondary | ICD-10-CM | POA: Diagnosis not present

## 2022-10-09 DIAGNOSIS — M461 Sacroiliitis, not elsewhere classified: Secondary | ICD-10-CM | POA: Diagnosis not present

## 2022-12-04 DIAGNOSIS — G894 Chronic pain syndrome: Secondary | ICD-10-CM | POA: Diagnosis not present

## 2022-12-04 DIAGNOSIS — M461 Sacroiliitis, not elsewhere classified: Secondary | ICD-10-CM | POA: Diagnosis not present

## 2022-12-04 DIAGNOSIS — G629 Polyneuropathy, unspecified: Secondary | ICD-10-CM | POA: Diagnosis not present

## 2022-12-04 DIAGNOSIS — M62830 Muscle spasm of back: Secondary | ICD-10-CM | POA: Diagnosis not present

## 2022-12-26 ENCOUNTER — Inpatient Hospital Stay: Payer: Medicare Other | Admitting: Hematology & Oncology

## 2022-12-26 ENCOUNTER — Inpatient Hospital Stay: Payer: Medicare Other | Attending: Hematology & Oncology

## 2022-12-26 ENCOUNTER — Encounter: Payer: Self-pay | Admitting: Hematology & Oncology

## 2022-12-26 ENCOUNTER — Other Ambulatory Visit: Payer: Self-pay

## 2022-12-26 VITALS — BP 121/80 | HR 83 | Temp 98.2°F | Resp 18 | Ht 72.0 in | Wt 167.0 lb

## 2022-12-26 DIAGNOSIS — D472 Monoclonal gammopathy: Secondary | ICD-10-CM

## 2022-12-26 LAB — CBC WITH DIFFERENTIAL (CANCER CENTER ONLY)
Abs Immature Granulocytes: 0.01 10*3/uL (ref 0.00–0.07)
Basophils Absolute: 0 10*3/uL (ref 0.0–0.1)
Basophils Relative: 1 %
Eosinophils Absolute: 0.1 10*3/uL (ref 0.0–0.5)
Eosinophils Relative: 2 %
HCT: 39 % (ref 39.0–52.0)
Hemoglobin: 12.7 g/dL — ABNORMAL LOW (ref 13.0–17.0)
Immature Granulocytes: 0 %
Lymphocytes Relative: 27 %
Lymphs Abs: 1.7 10*3/uL (ref 0.7–4.0)
MCH: 30.4 pg (ref 26.0–34.0)
MCHC: 32.6 g/dL (ref 30.0–36.0)
MCV: 93.3 fL (ref 80.0–100.0)
Monocytes Absolute: 0.6 10*3/uL (ref 0.1–1.0)
Monocytes Relative: 9 %
Neutro Abs: 3.7 10*3/uL (ref 1.7–7.7)
Neutrophils Relative %: 61 %
Platelet Count: 208 10*3/uL (ref 150–400)
RBC: 4.18 MIL/uL — ABNORMAL LOW (ref 4.22–5.81)
RDW: 12.5 % (ref 11.5–15.5)
WBC Count: 6.1 10*3/uL (ref 4.0–10.5)
nRBC: 0 % (ref 0.0–0.2)

## 2022-12-26 LAB — CMP (CANCER CENTER ONLY)
ALT: 17 U/L (ref 0–44)
AST: 22 U/L (ref 15–41)
Albumin: 4.5 g/dL (ref 3.5–5.0)
Alkaline Phosphatase: 66 U/L (ref 38–126)
Anion gap: 9 (ref 5–15)
BUN: 12 mg/dL (ref 8–23)
CO2: 26 mmol/L (ref 22–32)
Calcium: 9.9 mg/dL (ref 8.9–10.3)
Chloride: 102 mmol/L (ref 98–111)
Creatinine: 0.96 mg/dL (ref 0.61–1.24)
GFR, Estimated: >60 mL/min (ref >60)
Glucose, Bld: 142 mg/dL — ABNORMAL HIGH (ref 70–99)
Potassium: 4 mmol/L (ref 3.5–5.1)
Sodium: 137 mmol/L (ref 135–145)
Total Bilirubin: 0.4 mg/dL (ref 0.3–1.2)
Total Protein: 7.7 g/dL (ref 6.5–8.1)

## 2022-12-26 LAB — LACTATE DEHYDROGENASE: LDH: 121 U/L (ref 98–192)

## 2022-12-26 NOTE — Progress Notes (Signed)
Hematology and Oncology Follow Up Visit  [name] [name] [id number] [birth year] 67 y.o. 12/26/2022   Principle Diagnosis:  Smoldering IgG Kappa Myeloma   Current Therapy:   Observation    Interim History:  [name] [name] is here today for follow-up.  We see him every 6 months.  So far, he is doing pretty well.  He really has had no complaints since we last saw him.  He is still busy taking care of a cousin who has Down's syndrome.  When we last saw him, his monoclonal spike was 0.9 g/dL.  His IgG level was 1500 mg/dL.  The Kappa light chain was 13.3 mg/dL.  He has had no problems with nausea or vomiting.  There is been no rashes.  Has had no bleeding.   He he says that he is increased his gabapentin dosing..    Overall, I would say that his performance status is ECOG 1.       Medications:  Allergies as of 12/26/2022       Reactions   Aripiprazole Other (See Comments)    leg aching   Codeine Hives   Duloxetine Other (See Comments)   Other reaction(s): ineffective   Duloxetine Hcl Other (See Comments)   Fluoxetine Other (See Comments)   Other reaction(s): ineffective   Other Other (See Comments)   Simvastatin Other (See Comments)        Medication List        Accurate as of December 26, 2022 12:06 PM. If you have any questions, ask your nurse or doctor.          ALPRAZolam 0.5 MG tablet Commonly known as: XANAX Take 0.5 mg by mouth as directed.   amphetamine-dextroamphetamine 10 MG tablet Commonly known as: ADDERALL Take 10 mg by mouth as needed.   clopidogrel 75 MG tablet Commonly known as: PLAVIX Take 75 mg by mouth daily.   EPINEPHrine 0.3 mg/0.3 mL Soaj injection Commonly known as: EPI-PEN   FLUoxetine 20 MG capsule Commonly known as: PROZAC Take 20 mg by mouth daily at 6 (six) AM.   gabapentin 300 MG capsule Commonly known as: NEURONTIN TAKE 3 TABS BY MOUTH IN THE  AM & 4 TABS BY MOUTH IN THE PM   metFORMIN 500 MG tablet Commonly known as:  GLUCOPHAGE Take 1,000 mg by mouth 2 (two) times daily.   morphine 10 MG 24 hr capsule Commonly known as: KADIAN Take 10 mg by mouth every 8 (eight) hours as needed.   nitroGLYCERIN 0.4 MG SL tablet Commonly known as: NITROSTAT Place 1 tablet (0.4 mg total) under the tongue every 5 (five) minutes as needed for chest pain.   pantoprazole 40 MG tablet Commonly known as: PROTONIX Take 1 tablet (40 mg total) by mouth daily.   Percocet 10-325 MG tablet Generic drug: oxyCODONE-acetaminophen Take 1 tablet by mouth 3 (three) times daily as needed.   pravastatin 40 MG tablet Commonly known as: PRAVACHOL Take 40 mg by mouth daily.   simvastatin 20 MG tablet Commonly known as: ZOCOR Take 20 mg by mouth daily.   sitaGLIPtin 100 MG tablet Commonly known as: JANUVIA Take 100 mg by mouth daily.   sulfamethoxazole-trimethoprim 800-160 MG tablet Commonly known as: BACTRIM DS Take 1 tablet by mouth 2 (two) times daily.   zolpidem 10 MG tablet Commonly known as: AMBIEN Take 10 mg by mouth at bedtime.        Allergies:  Allergies  Allergen Reactions   Aripiprazole Other (See Comments)  leg aching   Codeine Hives   Duloxetine Other (See Comments)    Other reaction(s): ineffective   Duloxetine Hcl Other (See Comments)   Fluoxetine Other (See Comments)    Other reaction(s): ineffective   Other Other (See Comments)   Simvastatin Other (See Comments)    Past Medical History, Surgical history, Social history, and Family History were reviewed and updated.  Review of Systems: Review of Systems  Constitutional: Negative.   HENT: Negative.    Eyes: Negative.   Respiratory: Negative.    Cardiovascular: Negative.   Gastrointestinal: Negative.   Genitourinary: Negative.   Musculoskeletal:  Positive for back pain and myalgias.  Skin:  Positive for rash.  Neurological:  Positive for sensory change.  Psychiatric/Behavioral:  The patient is nervous/anxious.     Physical  Exam:  vitals were not taken for this visit.   Wt Readings from Last 3 Encounters:  07/05/22 168 lb (76.2 kg)  06/27/22 167 lb (75.8 kg)  12/26/21 159 lb (72.1 kg)    Physical Exam Vitals reviewed.  HENT:     Head: Normocephalic and atraumatic.  Eyes:     Pupils: Pupils are equal, round, and reactive to light.  Cardiovascular:     Rate and Rhythm: Normal rate and regular rhythm.     Heart sounds: Normal heart sounds.  Pulmonary:     Effort: Pulmonary effort is normal.     Breath sounds: Normal breath sounds.  Abdominal:     General: Bowel sounds are normal.     Palpations: Abdomen is soft.  Musculoskeletal:        General: No tenderness or deformity. Normal range of motion.     Cervical back: Normal range of motion.  Lymphadenopathy:     Cervical: No cervical adenopathy.  Skin:    General: Skin is warm and dry.     Findings: No erythema or rash.  Neurological:     Mental Status: He is alert and oriented to person, place, and time.  Psychiatric:        Behavior: Behavior normal.        Thought Content: Thought content normal.        Judgment: Judgment normal.     Lab Results  Component Value Date   WBC 6.1 12/26/2022   HGB 12.7 (L) 12/26/2022   HCT 39.0 12/26/2022   MCV 93.3 12/26/2022   PLT 208 12/26/2022   Lab Results  Component Value Date   FERRITIN 73.9 11/20/2009   IRON 76 11/20/2009   IRONPCTSAT 22.8 11/20/2009   Lab Results  Component Value Date   RBC 4.18 (L) 12/26/2022   Lab Results  Component Value Date   KPAFRELGTCHN 133.2 (H) 06/27/2022   LAMBDASER 9.2 06/27/2022   KAPLAMBRATIO 14.48 (H) 06/27/2022   Lab Results  Component Value Date   IGGSERUM 1,379 06/27/2022   IGGSERUM 1,979 (H) 06/27/2022   IGA 67 06/27/2022   IGA 92 06/27/2022   IGMSERUM 15 (L) 06/27/2022   IGMSERUM 15 (L) 06/27/2022   Lab Results  Component Value Date   TOTALPROTELP 7.0 06/27/2022   ALBUMINELP 3.9 06/27/2022   A1GS 0.2 06/27/2022   A2GS 0.7 06/27/2022    BETS 0.9 06/27/2022   GAMS 1.2 06/27/2022   MSPIKE 0.9 (H) 06/27/2022     Chemistry      Component Value Date/Time   NA 135 06/27/2022 1147   NA 145 05/07/2017 0945   NA 139 07/25/2016 0815   K 4.1 06/27/2022 1147  K 3.9 05/07/2017 0945   K 4.0 07/25/2016 0815   CL 97 (L) 06/27/2022 1147   CL 100 05/07/2017 0945   CO2 32 06/27/2022 1147   CO2 29 05/07/2017 0945   CO2 28 07/25/2016 0815   BUN 10 06/27/2022 1147   BUN 8 05/07/2017 0945   BUN 9.0 07/25/2016 0815   CREATININE 1.01 06/27/2022 1147   CREATININE 0.8 05/07/2017 0945   CREATININE 0.8 07/25/2016 0815      Component Value Date/Time   CALCIUM 9.7 06/27/2022 1147   CALCIUM 9.7 05/07/2017 0945   CALCIUM 9.6 07/25/2016 0815   ALKPHOS 69 06/27/2022 1147   ALKPHOS 60 05/07/2017 0945   ALKPHOS 57 07/25/2016 0815   AST 18 06/27/2022 1147   AST 15 07/25/2016 0815   ALT 14 06/27/2022 1147   ALT 20 05/07/2017 0945   ALT 13 07/25/2016 0815   BILITOT 0.5 06/27/2022 1147   BILITOT 0.55 07/25/2016 0815     Impression and Plan: Mr. Fearn is a 67 yo white male with an IgG Kappa smoldering myeloma diagnosed 9 years ago. He was treated initially with Velcade and Decadron and then Revlimid.   I see no obvious progression of the myeloma.  We will see what his monoclonal studies show.  His quality life is doing quite well right now.  Again I would be surprised if we had to initiate any therapy on him.  I still think we can follow him up in 6 months.      7/25/202412:06 PM

## 2023-01-03 ENCOUNTER — Encounter: Payer: Self-pay | Admitting: *Deleted

## 2023-01-29 DIAGNOSIS — M461 Sacroiliitis, not elsewhere classified: Secondary | ICD-10-CM | POA: Diagnosis not present

## 2023-01-29 DIAGNOSIS — G629 Polyneuropathy, unspecified: Secondary | ICD-10-CM | POA: Diagnosis not present

## 2023-01-29 DIAGNOSIS — G894 Chronic pain syndrome: Secondary | ICD-10-CM | POA: Diagnosis not present

## 2023-01-29 DIAGNOSIS — M62830 Muscle spasm of back: Secondary | ICD-10-CM | POA: Diagnosis not present

## 2023-03-19 DIAGNOSIS — I119 Hypertensive heart disease without heart failure: Secondary | ICD-10-CM | POA: Diagnosis not present

## 2023-03-19 DIAGNOSIS — E782 Mixed hyperlipidemia: Secondary | ICD-10-CM | POA: Diagnosis not present

## 2023-03-19 DIAGNOSIS — Z23 Encounter for immunization: Secondary | ICD-10-CM | POA: Diagnosis not present

## 2023-03-19 DIAGNOSIS — E1142 Type 2 diabetes mellitus with diabetic polyneuropathy: Secondary | ICD-10-CM | POA: Diagnosis not present

## 2023-03-26 DIAGNOSIS — G894 Chronic pain syndrome: Secondary | ICD-10-CM | POA: Diagnosis not present

## 2023-03-26 DIAGNOSIS — G629 Polyneuropathy, unspecified: Secondary | ICD-10-CM | POA: Diagnosis not present

## 2023-03-26 DIAGNOSIS — M461 Sacroiliitis, not elsewhere classified: Secondary | ICD-10-CM | POA: Diagnosis not present

## 2023-03-26 DIAGNOSIS — M62830 Muscle spasm of back: Secondary | ICD-10-CM | POA: Diagnosis not present

## 2023-06-06 DIAGNOSIS — G629 Polyneuropathy, unspecified: Secondary | ICD-10-CM | POA: Diagnosis not present

## 2023-06-06 DIAGNOSIS — G894 Chronic pain syndrome: Secondary | ICD-10-CM | POA: Diagnosis not present

## 2023-06-06 DIAGNOSIS — M461 Sacroiliitis, not elsewhere classified: Secondary | ICD-10-CM | POA: Diagnosis not present

## 2023-06-06 DIAGNOSIS — M62830 Muscle spasm of back: Secondary | ICD-10-CM | POA: Diagnosis not present

## 2023-06-27 ENCOUNTER — Inpatient Hospital Stay: Payer: Medicare Other | Admitting: Hematology & Oncology

## 2023-06-27 ENCOUNTER — Inpatient Hospital Stay: Payer: Medicare Other | Attending: Hematology & Oncology

## 2023-06-27 ENCOUNTER — Encounter: Payer: Self-pay | Admitting: Hematology & Oncology

## 2023-06-27 VITALS — BP 98/71 | HR 72 | Temp 98.3°F | Resp 18 | Ht 72.0 in | Wt 159.8 lb

## 2023-06-27 DIAGNOSIS — D472 Monoclonal gammopathy: Secondary | ICD-10-CM

## 2023-06-27 LAB — CBC WITH DIFFERENTIAL (CANCER CENTER ONLY)
Abs Immature Granulocytes: 0.03 10*3/uL (ref 0.00–0.07)
Basophils Absolute: 0 10*3/uL (ref 0.0–0.1)
Basophils Relative: 0 %
Eosinophils Absolute: 0.1 10*3/uL (ref 0.0–0.5)
Eosinophils Relative: 1 %
HCT: 41.3 % (ref 39.0–52.0)
Hemoglobin: 13.7 g/dL (ref 13.0–17.0)
Immature Granulocytes: 0 %
Lymphocytes Relative: 19 %
Lymphs Abs: 1.4 10*3/uL (ref 0.7–4.0)
MCH: 30.4 pg (ref 26.0–34.0)
MCHC: 33.2 g/dL (ref 30.0–36.0)
MCV: 91.6 fL (ref 80.0–100.0)
Monocytes Absolute: 0.4 10*3/uL (ref 0.1–1.0)
Monocytes Relative: 6 %
Neutro Abs: 5.4 10*3/uL (ref 1.7–7.7)
Neutrophils Relative %: 74 %
Platelet Count: 276 10*3/uL (ref 150–400)
RBC: 4.51 MIL/uL (ref 4.22–5.81)
RDW: 13.4 % (ref 11.5–15.5)
WBC Count: 7.3 10*3/uL (ref 4.0–10.5)
nRBC: 0 % (ref 0.0–0.2)

## 2023-06-27 LAB — CMP (CANCER CENTER ONLY)
ALT: 11 U/L (ref 0–44)
AST: 19 U/L (ref 15–41)
Albumin: 4.9 g/dL (ref 3.5–5.0)
Alkaline Phosphatase: 51 U/L (ref 38–126)
Anion gap: 8 (ref 5–15)
BUN: 11 mg/dL (ref 8–23)
CO2: 27 mmol/L (ref 22–32)
Calcium: 9.9 mg/dL (ref 8.9–10.3)
Chloride: 102 mmol/L (ref 98–111)
Creatinine: 0.96 mg/dL (ref 0.61–1.24)
GFR, Estimated: >60 mL/min (ref >60)
Glucose, Bld: 121 mg/dL — ABNORMAL HIGH (ref 70–99)
Potassium: 4.3 mmol/L (ref 3.5–5.1)
Sodium: 137 mmol/L (ref 135–145)
Total Bilirubin: 0.5 mg/dL (ref 0.0–1.2)
Total Protein: 8.2 g/dL — ABNORMAL HIGH (ref 6.5–8.1)

## 2023-06-27 NOTE — Progress Notes (Signed)
Hematology and Oncology Follow Up Visit  [name] [name] [id number] 09-19-1955 68 y.o. 06/27/2023   Principle Diagnosis:  Smoldering IgG Kappa Myeloma   Current Therapy:   Observation    Interim History:  [name] [name] is here today for follow-up.  We last saw him back in July.  Since then, has been doing okay.  The big news is that he and his wife took their cousin, who has Down syndrome, back home to work on.  This was recently.  I know they took care of him for the past 3 years.  However, his cousin's sister, who lives out or gone, wanted to take care of him.  There will be going out to [place] in late April.  They will be going on a river cruise.  Otherwise, he has been doing okay.  He has not had problems with any COVID issues.  When we last saw, his monoclonal studies showed a M spike of 1.2 g/dL.  His IgG level was 1700 mg/dL.  The Kappa light chain was 12.8 mg/dL.  He has had no issues with cough or shortness of breath.  He has had constipation, likely from his chronic pain medications.  He has had no leg swelling.  He has had no bleeding.  His appetite has been okay.  Currently, I would say his performance status is probably ECOG 1.        Medications:  Allergies as of 06/27/2023       Reactions   Aripiprazole Other (See Comments)    leg aching   Codeine Hives   Duloxetine Other (See Comments)   Other reaction(s): ineffective   Duloxetine Hcl Other (See Comments)   Fluoxetine Other (See Comments)   Other reaction(s): ineffective   Other Other (See Comments)   Simvastatin Other (See Comments)        Medication List        Accurate as of June 27, 2023  1:20 PM. If you have any questions, ask your nurse or doctor.          STOP taking these medications    pravastatin 40 MG tablet Commonly known as: PRAVACHOL Stopped by: Josph Macho   simvastatin 20 MG tablet Commonly known as: ZOCOR Stopped by: Josph Macho       TAKE these medications     ALPRAZolam 0.5 MG tablet Commonly known as: XANAX Take 0.5 mg by mouth as directed.   Ambien CR 12.5 MG CR tablet Generic drug: zolpidem Take 12.5 mg by mouth at bedtime as needed for sleep. What changed: Another medication with the same name was removed. Continue taking this medication, and follow the directions you see here. Changed by: Josph Macho   clopidogrel 75 MG tablet Commonly known as: PLAVIX Take 75 mg by mouth daily.   EPINEPHrine 0.3 mg/0.3 mL Soaj injection Commonly known as: EPI-PEN   FLUoxetine 20 MG capsule Commonly known as: PROZAC Take 20 mg by mouth daily at 6 (six) AM.   gabapentin 300 MG capsule Commonly known as: NEURONTIN TAKE 3 TABS BY MOUTH IN THE  AM & 4 TABS BY MOUTH IN THE PM   metFORMIN 500 MG tablet Commonly known as: GLUCOPHAGE Take 1,000 mg by mouth 2 (two) times daily.   morphine 10 MG 24 hr capsule Commonly known as: KADIAN Take 10 mg by mouth every 8 (eight) hours as needed.   morphine 30 MG 12 hr tablet Commonly known as: MS CONTIN Take 30 mg by  mouth every 8 (eight) hours.   nitroGLYCERIN 0.4 MG SL tablet Commonly known as: NITROSTAT Place 1 tablet (0.4 mg total) under the tongue every 5 (five) minutes as needed for chest pain.   pantoprazole 40 MG tablet Commonly known as: PROTONIX Take 1 tablet (40 mg total) by mouth daily.   Percocet 10-325 MG tablet Generic drug: oxyCODONE-acetaminophen Take 1 tablet by mouth 3 (three) times daily as needed.   rosuvastatin 20 MG tablet Commonly known as: CRESTOR Take 20 mg by mouth daily.   sitaGLIPtin 100 MG tablet Commonly known as: JANUVIA Take 100 mg by mouth daily.        Allergies:  Allergies  Allergen Reactions   Aripiprazole Other (See Comments)     leg aching   Codeine Hives   Duloxetine Other (See Comments)    Other reaction(s): ineffective   Duloxetine Hcl Other (See Comments)   Fluoxetine Other (See Comments)    Other reaction(s): ineffective   Other  Other (See Comments)   Simvastatin Other (See Comments)    Past Medical History, Surgical history, Social history, and Family History were reviewed and updated.  Review of Systems: Review of Systems  Constitutional: Negative.   HENT: Negative.    Eyes: Negative.   Respiratory: Negative.    Cardiovascular: Negative.   Gastrointestinal: Negative.   Genitourinary: Negative.   Musculoskeletal:  Positive for back pain and myalgias.  Skin:  Positive for rash.  Neurological:  Positive for sensory change.  Psychiatric/Behavioral:  The patient is nervous/anxious.     Physical Exam:  height is 6' (1.829 m) and weight is 159 lb 12.8 oz (72.5 kg). His oral temperature is 98.3 F (36.8 C). His blood pressure is 98/71 and his pulse is 72. His respiration is 18 and oxygen saturation is 100%.   Wt Readings from Last 3 Encounters:  06/27/23 159 lb 12.8 oz (72.5 kg)  12/26/22 167 lb (75.8 kg)  07/05/22 168 lb (76.2 kg)    Physical Exam Vitals reviewed.  HENT:     Head: Normocephalic and atraumatic.  Eyes:     Pupils: Pupils are equal, round, and reactive to light.  Cardiovascular:     Rate and Rhythm: Normal rate and regular rhythm.     Heart sounds: Normal heart sounds.  Pulmonary:     Effort: Pulmonary effort is normal.     Breath sounds: Normal breath sounds.  Abdominal:     General: Bowel sounds are normal.     Palpations: Abdomen is soft.  Musculoskeletal:        General: No tenderness or deformity. Normal range of motion.     Cervical back: Normal range of motion.  Lymphadenopathy:     Cervical: No cervical adenopathy.  Skin:    General: Skin is warm and dry.     Findings: No erythema or rash.  Neurological:     Mental Status: He is alert and oriented to person, place, and time.  Psychiatric:        Behavior: Behavior normal.        Thought Content: Thought content normal.        Judgment: Judgment normal.     Lab Results  Component Value Date   WBC 7.3 06/27/2023    HGB 13.7 06/27/2023   HCT 41.3 06/27/2023   MCV 91.6 06/27/2023   PLT 276 06/27/2023   Lab Results  Component Value Date   FERRITIN 73.9 11/20/2009   IRON 76 11/20/2009   IRONPCTSAT 22.8 11/20/2009  Lab Results  Component Value Date   RBC 4.51 06/27/2023   Lab Results  Component Value Date   KPAFRELGTCHN 127.9 (H) 12/26/2022   LAMBDASER 10.1 12/26/2022   KAPLAMBRATIO 12.66 (H) 12/26/2022   Lab Results  Component Value Date   IGGSERUM 1,603 12/26/2022   IGGSERUM 1,781 (H) 12/26/2022   IGA 80 12/26/2022   IGA 83 12/26/2022   IGMSERUM 18 (L) 12/26/2022   IGMSERUM 19 (L) 12/26/2022   Lab Results  Component Value Date   TOTALPROTELP 7.2 12/26/2022   ALBUMINELP 3.8 12/26/2022   A1GS 0.3 12/26/2022   A2GS 0.9 12/26/2022   BETS 0.8 12/26/2022   GAMS 1.4 12/26/2022   MSPIKE 1.2 (H) 12/26/2022     Chemistry      Component Value Date/Time   NA 137 06/27/2023 1149   NA 145 05/07/2017 0945   NA 139 07/25/2016 0815   K 4.3 06/27/2023 1149   K 3.9 05/07/2017 0945   K 4.0 07/25/2016 0815   CL 102 06/27/2023 1149   CL 100 05/07/2017 0945   CO2 27 06/27/2023 1149   CO2 29 05/07/2017 0945   CO2 28 07/25/2016 0815   BUN 11 06/27/2023 1149   BUN 8 05/07/2017 0945   BUN 9.0 07/25/2016 0815   CREATININE 0.96 06/27/2023 1149   CREATININE 0.8 05/07/2017 0945   CREATININE 0.8 07/25/2016 0815      Component Value Date/Time   CALCIUM 9.9 06/27/2023 1149   CALCIUM 9.7 05/07/2017 0945   CALCIUM 9.6 07/25/2016 0815   ALKPHOS 51 06/27/2023 1149   ALKPHOS 60 05/07/2017 0945   ALKPHOS 57 07/25/2016 0815   AST 19 06/27/2023 1149   AST 15 07/25/2016 0815   ALT 11 06/27/2023 1149   ALT 20 05/07/2017 0945   ALT 13 07/25/2016 0815   BILITOT 0.5 06/27/2023 1149   BILITOT 0.55 07/25/2016 0815     Impression and Plan: Mr. Nance is a 68 yo white male with an IgG Kappa smoldering myeloma diagnosed 9 years ago. He was treated initially with Velcade and Decadron and then Revlimid.    I am a little bit troubled by the fact that his total protein is quite high today.  Is 8.2.  This is certainly progressed for him.  We will have to see what the myeloma studies show.  Again he is asymptomatic right now.  We will try her best to hold off on treatment as long as possible.  I would like to see him back in another 6 months.  I look forward to hearing back from him as to how his trip was in [place].    1/24/20251:20 PM

## 2023-06-29 LAB — IGG, IGA, IGM
IgA: 89 mg/dL (ref 61–437)
IgG (Immunoglobin G), Serum: 1966 mg/dL — ABNORMAL HIGH (ref 603–1613)
IgM (Immunoglobulin M), Srm: 19 mg/dL — ABNORMAL LOW (ref 20–172)

## 2023-06-30 LAB — KAPPA/LAMBDA LIGHT CHAINS
Kappa free light chain: 169 mg/L — ABNORMAL HIGH (ref 3.3–19.4)
Kappa, lambda light chain ratio: 21.39 — ABNORMAL HIGH (ref 0.26–1.65)
Lambda free light chains: 7.9 mg/L (ref 5.7–26.3)

## 2023-07-02 ENCOUNTER — Telehealth: Payer: Self-pay | Admitting: *Deleted

## 2023-07-02 NOTE — Telephone Encounter (Addendum)
-----   Message from Josph Macho sent at 07/01/2023  7:08 PM EST ----- Called patients wife and gave her this information  "I hate to say this but we are going to have to staert treatment for the myeloma. The myeloma levels are moving up to quickly.   I need a 45 minute appointment in 3-4 weeks to discuss chemo."  Scheduling message sent

## 2023-07-04 ENCOUNTER — Telehealth: Payer: Self-pay | Admitting: Hematology & Oncology

## 2023-07-04 NOTE — Telephone Encounter (Signed)
Called to schedule appt per inbasket. LVM to return call for scheduling.

## 2023-07-07 ENCOUNTER — Telehealth: Payer: Self-pay | Admitting: *Deleted

## 2023-07-07 LAB — PROTEIN ELECTROPHORESIS, SERUM, WITH REFLEX
A/G Ratio: 1.1 (ref 0.7–1.7)
Albumin ELP: 4.1 g/dL (ref 2.9–4.4)
Alpha-1-Globulin: 0.3 g/dL (ref 0.0–0.4)
Alpha-2-Globulin: 0.9 g/dL (ref 0.4–1.0)
Beta Globulin: 1.1 g/dL (ref 0.7–1.3)
Gamma Globulin: 1.6 g/dL (ref 0.4–1.8)
Globulin, Total: 3.9 g/dL (ref 2.2–3.9)
M-Spike, %: 1.3 g/dL — ABNORMAL HIGH
SPEP Interpretation: 0
Total Protein ELP: 8 g/dL (ref 6.0–8.5)

## 2023-07-07 LAB — IMMUNOFIXATION REFLEX, SERUM
IgA: 94 mg/dL (ref 61–437)
IgG (Immunoglobin G), Serum: 4054 mg/dL — ABNORMAL HIGH (ref 603–1613)
IgM (Immunoglobulin M), Srm: 21 mg/dL (ref 20–172)

## 2023-07-07 NOTE — Addendum Note (Signed)
Addended by: Josph Macho on: 07/07/2023 04:16 PM   Modules accepted: Orders

## 2023-07-07 NOTE — Telephone Encounter (Signed)
Patient called to check on his appointment with Dr Myna Hidalgo that has not been scheduled yet.  Scheduling message sent last week.  Patient anxious to see dR Ennever and initiate myeloma treatment.  Asked to speak to Dr Myna Hidalgo.  Advised patient that he has a full load of patients and is on call today but I would relay this message to him.  Scheduling message sent for Dr Tama Gander first available.

## 2023-07-11 ENCOUNTER — Other Ambulatory Visit: Payer: Self-pay | Admitting: Student

## 2023-07-11 DIAGNOSIS — D472 Monoclonal gammopathy: Secondary | ICD-10-CM

## 2023-07-13 NOTE — H&P (Signed)
 Chief Complaint: Smoldering multiple myeloma; referred for CT guided bone marrow biopsy to assess for marrow involvement  Referring Provider(s): Ennever,P  Supervising Physician: Marland Silvas  Patient Status: [facility] - Out-pt  History of Present Illness: [name] [name] is a 68 y.o. male with PMH sig for CAD with prior stenting, depression, DM,GERD, HLD, OSA,RLS, peripheral neuropathy and smoldering multiple myeloma diagnosed 9 years ago, initially treated with Velcade, decadron  and Revlimid. His total protein remains elevated . He is scheduled today for CT guided bone marrow biopsy to r/o marrow involvement prior to additional therapy. He underwent port a cath removal by our IR team in 2018.    Patient is Full Code  Past Medical History:  Diagnosis Date   CAD (coronary artery disease)    stents '99 and '05; patent circ and IM stents cath. myoview  10/08 non ischemic   Chronic back pain    Depression    DM (diabetes mellitus) (HCC)    GERD (gastroesophageal reflux disease)    HLD (hyperlipidemia)    Insomnia    Multiple myeloma    dx 11/11. followed by Dr. Remonia Carmin in [place]   OSA (obstructive sleep apnea)    AHI 5 from home sleep test 10/05/09   Peripheral neuropathy    Prostatism    RLS (restless legs syndrome)    responded to iron supplementation    Past Surgical History:  Procedure Laterality Date   IR REMOVAL TUN ACCESS W/ PORT W/O FL MOD SED  12/11/2016   IR TRANSCATH RETRIEVAL FB INCL GUIDANCE (MS)  12/11/2016   IR US  GUIDE VASC ACCESS RIGHT  12/11/2016   L4-5 epidural steriod injection     with fluoroscopic guidance   release of rt long finger A1     pulley with debridement of tenosynovitis   stent placed     95% lesion in AV circumflex that was dilated and stented with an AVE stent. also had 60% lesion in the OM-I and minor disease in LAD and RCA 02/14/98. Dr [employer] cc Dr. Lavonne Pra[place]     Allergies: Aripiprazole, Codeine, Duloxetine, Duloxetine hcl, Fluoxetine, Other,  and Simvastatin   Medications: Prior to Admission medications   Medication Sig Start Date End Date Taking? Authorizing Provider  ALPRAZolam (XANAX) 0.5 MG tablet Take 0.5 mg by mouth as directed.     [provider]  clopidogrel  (PLAVIX ) 75 MG tablet Take 75 mg by mouth daily.    [provider]  EPINEPHrine  0.3 mg/0.3 mL IJ SOAJ injection  01/27/18   [provider]  FLUoxetine (PROZAC) 20 MG capsule Take 20 mg by mouth daily at 6 (six) AM. 02/20/21   [provider]  gabapentin (NEURONTIN) 300 MG capsule TAKE 3 TABS BY MOUTH IN THE  AM & 4 TABS BY MOUTH IN THE PM    [provider]  metFORMIN (GLUCOPHAGE) 500 MG tablet Take 1,000 mg by mouth 2 (two) times daily.     [provider]  morphine (KADIAN) 10 MG 24 hr capsule Take 10 mg by mouth every 8 (eight) hours as needed.    [provider]  morphine (MS CONTIN) 30 MG 12 hr tablet Take 30 mg by mouth every 8 (eight) hours. 06/23/23   [provider]  nitroGLYCERIN  (NITROSTAT ) 0.4 MG SL tablet Place 1 tablet (0.4 mg total) under the tongue every 5 (five) minutes as needed for chest pain. Patient not taking: Reported on 06/27/2023 11/06/21   Loyde Rule, MD  pantoprazole  (PROTONIX ) 40  MG tablet Take 1 tablet (40 mg total) by mouth daily. 03/01/13   Nishan, Peter C, MD  PERCOCET 10-325 MG tablet Take 1 tablet by mouth 3 (three) times daily as needed. 06/22/20   [provider]  rosuvastatin (CRESTOR) 20 MG tablet Take 20 mg by mouth daily. 02/20/23   [provider]  sitaGLIPtin (JANUVIA) 100 MG tablet Take 100 mg by mouth daily.    [provider]  zolpidem (AMBIEN CR) 12.5 MG CR tablet Take 12.5 mg by mouth at bedtime as needed for sleep. 06/24/23   [provider]     Family History  Problem Relation Age of Onset   Heart disease Father    Diabetes Father    Hypertension Mother     Social History   Socioeconomic History   Marital  status: Married    Spouse name: Not on file   Number of children: Not on file   Years of education: Not on file   Highest education level: Not on file  Occupational History   Not on file  Tobacco Use   Smoking status: Former   Smokeless tobacco: Never   Tobacco comments:    no intake of tobacco produtcs; does chew nicotene gum   Vaping Use   Vaping status: Never Used  Substance and Sexual Activity   Alcohol use: No   Drug use: Not on file   Sexual activity: Not on file  Other Topics Concern   Not on file  Social History Narrative   Married, no children; business owner; 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    Social Drivers of Corporate investment banker Strain: Not on file  Food Insecurity: Not on file  Transportation Needs: Not on file  Physical Activity: Not on file  Stress: Not on file  Social Connections: Not on file     Review of Systems denies fever,HA,CP,dyspnea, cough, abd pain, N/V or bleeding; he does have chronic back pain  Vital Signs: Vitals:   07/14/23 0727  BP: 126/87  Pulse: 83  Resp: 16  Temp: 98.3 F (36.8 C)  SpO2: 99%      Advance Care Plan: no documents on file  Physical Exam: awake/alert; chest- CTA bilat; heart- RRR; abd-soft,+BS,NT; no LE edema  Imaging: No results found.  Labs:  CBC: Recent Labs    12/26/22 1147 06/27/23 1149  WBC 6.1 7.3  HGB 12.7* 13.7  HCT 39.0 41.3  PLT 208 276    COAGS: No results for input(s): "INR", "APTT" in the last 8760 hours.  BMP: Recent Labs    12/26/22 1147 06/27/23 1149  NA 137 137  K 4.0 4.3  CL 102 102  CO2 26 27  GLUCOSE 142* 121*  BUN 12 11  CALCIUM 9.9 9.9  CREATININE 0.96 0.96  GFRNONAA >60 >60    LIVER FUNCTION TESTS: Recent Labs    12/26/22 1147 06/27/23 1149  BILITOT 0.4 0.5  AST 22 19  ALT 17 11  ALKPHOS 66 51  PROT 7.7 8.2*  ALBUMIN 4.5 4.9    TUMOR MARKERS: No results for input(s): "AFPTM", "CEA", "CA199", "CHROMGRNA" in the last 8760  hours.  Assessment and Plan: 68 y.o. male with PMH sig for CAD with prior stenting, depression, DM,GERD, HLD, OSA,RLS, peripheral neuropathy and smoldering multiple myeloma diagnosed 9 years ago, initially treated with Velcade, decadron  and Revlimid. His total protein remains elevated . He is scheduled today for CT guided bone marrow biopsy to r/o marrow involvement  prior to additional therapy. He underwent port a cath removal by our IR team in 2018. Risks and benefits of procedure was discussed with the patient/spouse  including, but not limited to bleeding, infection, damage to adjacent structures or low yield requiring additional tests.  All of the questions were answered and there is agreement to proceed.  Consent signed and in chart.    Thank you for allowing our service to participate in [name] [name] 's care.  Electronically Signed: D. Honore Lux, PA-C   07/13/2023, 12:58 PM      I spent a total of  15 Minutes   in face to face in clinical consultation, greater than 50% of which was counseling/coordinating care for CT guided bone marrow biopsy

## 2023-07-14 ENCOUNTER — Ambulatory Visit (HOSPITAL_COMMUNITY)
Admission: RE | Admit: 2023-07-14 | Discharge: 2023-07-14 | Disposition: A | Discharge status: Home / Self Care | Payer: Medicare Other | Source: Ambulatory Visit | Attending: Hematology & Oncology | Admitting: Hematology & Oncology

## 2023-07-14 ENCOUNTER — Encounter (HOSPITAL_COMMUNITY): Payer: Self-pay

## 2023-07-14 DIAGNOSIS — F32A Depression, unspecified: Secondary | ICD-10-CM | POA: Diagnosis not present

## 2023-07-14 DIAGNOSIS — D649 Anemia, unspecified: Secondary | ICD-10-CM | POA: Diagnosis not present

## 2023-07-14 DIAGNOSIS — I2510 Atherosclerotic heart disease of native coronary artery without angina pectoris: Secondary | ICD-10-CM | POA: Diagnosis not present

## 2023-07-14 DIAGNOSIS — G2581 Restless legs syndrome: Secondary | ICD-10-CM | POA: Diagnosis not present

## 2023-07-14 DIAGNOSIS — G4733 Obstructive sleep apnea (adult) (pediatric): Secondary | ICD-10-CM | POA: Diagnosis not present

## 2023-07-14 DIAGNOSIS — Z955 Presence of coronary angioplasty implant and graft: Secondary | ICD-10-CM | POA: Insufficient documentation

## 2023-07-14 DIAGNOSIS — K219 Gastro-esophageal reflux disease without esophagitis: Secondary | ICD-10-CM | POA: Insufficient documentation

## 2023-07-14 DIAGNOSIS — E1142 Type 2 diabetes mellitus with diabetic polyneuropathy: Secondary | ICD-10-CM | POA: Diagnosis not present

## 2023-07-14 DIAGNOSIS — Z7984 Long term (current) use of oral hypoglycemic drugs: Secondary | ICD-10-CM | POA: Insufficient documentation

## 2023-07-14 DIAGNOSIS — D472 Monoclonal gammopathy: Secondary | ICD-10-CM

## 2023-07-14 DIAGNOSIS — E785 Hyperlipidemia, unspecified: Secondary | ICD-10-CM | POA: Insufficient documentation

## 2023-07-14 DIAGNOSIS — Z1379 Encounter for other screening for genetic and chromosomal anomalies: Secondary | ICD-10-CM | POA: Insufficient documentation

## 2023-07-14 DIAGNOSIS — C9000 Multiple myeloma not having achieved remission: Secondary | ICD-10-CM | POA: Diagnosis not present

## 2023-07-14 LAB — PROTIME-INR
INR: 1 (ref 0.8–1.2)
Prothrombin Time: 13.1 s (ref 11.4–15.2)

## 2023-07-14 LAB — GLUCOSE, CAPILLARY: Glucose-Capillary: 151 mg/dL — ABNORMAL HIGH (ref 70–99)

## 2023-07-14 LAB — CBC WITH DIFFERENTIAL/PLATELET
Abs Immature Granulocytes: 0.03 10*3/uL (ref 0.00–0.07)
Basophils Absolute: 0 10*3/uL (ref 0.0–0.1)
Basophils Relative: 0 %
Eosinophils Absolute: 0.1 10*3/uL (ref 0.0–0.5)
Eosinophils Relative: 1 %
HCT: 35.4 % — ABNORMAL LOW (ref 39.0–52.0)
Hemoglobin: 11.6 g/dL — ABNORMAL LOW (ref 13.0–17.0)
Immature Granulocytes: 0 %
Lymphocytes Relative: 21 %
Lymphs Abs: 1.7 10*3/uL (ref 0.7–4.0)
MCH: 30.8 pg (ref 26.0–34.0)
MCHC: 32.8 g/dL (ref 30.0–36.0)
MCV: 93.9 fL (ref 80.0–100.0)
Monocytes Absolute: 0.5 10*3/uL (ref 0.1–1.0)
Monocytes Relative: 6 %
Neutro Abs: 5.9 10*3/uL (ref 1.7–7.7)
Neutrophils Relative %: 72 %
Platelets: 234 10*3/uL (ref 150–400)
RBC: 3.77 MIL/uL — ABNORMAL LOW (ref 4.22–5.81)
RDW: 13.4 % (ref 11.5–15.5)
WBC: 8.2 10*3/uL (ref 4.0–10.5)
nRBC: 0 % (ref 0.0–0.2)

## 2023-07-14 MED ORDER — MIDAZOLAM HCL 2 MG/2ML IJ SOLN
INTRAMUSCULAR | Status: AC | PRN
  Administered 2023-07-14 (×2): 1 mg via INTRAVENOUS

## 2023-07-14 MED ORDER — FENTANYL CITRATE (PF) 100 MCG/2ML IJ SOLN
INTRAMUSCULAR | Status: AC | PRN
  Administered 2023-07-14: 50 ug via INTRAVENOUS

## 2023-07-14 MED ORDER — SODIUM CHLORIDE 0.9 % IV SOLN: INTRAVENOUS | Status: DC

## 2023-07-14 MED ORDER — MIDAZOLAM HCL 2 MG/2ML IJ SOLN
INTRAMUSCULAR | Status: AC
  Filled 2023-07-14: qty 4

## 2023-07-14 MED ORDER — FENTANYL CITRATE (PF) 100 MCG/2ML IJ SOLN
INTRAMUSCULAR | Status: AC
  Filled 2023-07-14: qty 4

## 2023-07-14 NOTE — Procedures (Signed)
  Procedure:  CT bone marrow biopsy R iliac Preprocedure diagnosis: The encounter diagnosis was Smoldering multiple myeloma. Postprocedure diagnosis: same EBL:    minimal Complications:   none immediate  See full dictation in [employer].  Nicky Barrack MD Main # [phone] Pager  [phone] Mobile [phone]

## 2023-07-15 ENCOUNTER — Ambulatory Visit: Payer: Medicare Other | Admitting: Hematology & Oncology

## 2023-07-15 ENCOUNTER — Inpatient Hospital Stay: Payer: Medicare Other

## 2023-07-16 LAB — SURGICAL PATHOLOGY

## 2023-07-21 ENCOUNTER — Encounter: Payer: Self-pay | Admitting: *Deleted

## 2023-07-24 ENCOUNTER — Encounter (HOSPITAL_COMMUNITY): Payer: Self-pay | Admitting: Hematology & Oncology

## 2023-07-31 DIAGNOSIS — G894 Chronic pain syndrome: Secondary | ICD-10-CM | POA: Diagnosis not present

## 2023-07-31 DIAGNOSIS — G629 Polyneuropathy, unspecified: Secondary | ICD-10-CM | POA: Diagnosis not present

## 2023-07-31 DIAGNOSIS — M62830 Muscle spasm of back: Secondary | ICD-10-CM | POA: Diagnosis not present

## 2023-07-31 DIAGNOSIS — M461 Sacroiliitis, not elsewhere classified: Secondary | ICD-10-CM | POA: Diagnosis not present

## 2023-07-31 DIAGNOSIS — Z79891 Long term (current) use of opiate analgesic: Secondary | ICD-10-CM | POA: Diagnosis not present

## 2023-08-14 ENCOUNTER — Inpatient Hospital Stay: Payer: Medicare Other | Attending: Hematology & Oncology

## 2023-08-14 ENCOUNTER — Inpatient Hospital Stay: Payer: Medicare Other | Admitting: Hematology & Oncology

## 2023-08-14 ENCOUNTER — Other Ambulatory Visit: Payer: Self-pay

## 2023-08-14 VITALS — BP 101/75 | HR 70 | Temp 98.0°F | Resp 16 | Ht 72.0 in | Wt 162.0 lb

## 2023-08-14 DIAGNOSIS — K5909 Other constipation: Secondary | ICD-10-CM | POA: Insufficient documentation

## 2023-08-14 DIAGNOSIS — D472 Monoclonal gammopathy: Secondary | ICD-10-CM | POA: Insufficient documentation

## 2023-08-14 LAB — CBC WITH DIFFERENTIAL (CANCER CENTER ONLY)
Abs Immature Granulocytes: 0.01 10*3/uL (ref 0.00–0.07)
Basophils Absolute: 0 10*3/uL (ref 0.0–0.1)
Basophils Relative: 0 %
Eosinophils Absolute: 0.1 10*3/uL (ref 0.0–0.5)
Eosinophils Relative: 2 %
HCT: 39.4 % (ref 39.0–52.0)
Hemoglobin: 13 g/dL (ref 13.0–17.0)
Immature Granulocytes: 0 %
Lymphocytes Relative: 29 %
Lymphs Abs: 1.9 10*3/uL (ref 0.7–4.0)
MCH: 31.1 pg (ref 26.0–34.0)
MCHC: 33 g/dL (ref 30.0–36.0)
MCV: 94.3 fL (ref 80.0–100.0)
Monocytes Absolute: 0.6 10*3/uL (ref 0.1–1.0)
Monocytes Relative: 9 %
Neutro Abs: 3.8 10*3/uL (ref 1.7–7.7)
Neutrophils Relative %: 60 %
Platelet Count: 232 10*3/uL (ref 150–400)
RBC: 4.18 MIL/uL — ABNORMAL LOW (ref 4.22–5.81)
RDW: 13.1 % (ref 11.5–15.5)
WBC Count: 6.5 10*3/uL (ref 4.0–10.5)
nRBC: 0 % (ref 0.0–0.2)

## 2023-08-14 LAB — CMP (CANCER CENTER ONLY)
ALT: 8 U/L (ref 0–44)
AST: 14 U/L — ABNORMAL LOW (ref 15–41)
Albumin: 4.7 g/dL (ref 3.5–5.0)
Alkaline Phosphatase: 66 U/L (ref 38–126)
Anion gap: 8 (ref 5–15)
BUN: 13 mg/dL (ref 8–23)
CO2: 29 mmol/L (ref 22–32)
Calcium: 9.3 mg/dL (ref 8.9–10.3)
Chloride: 99 mmol/L (ref 98–111)
Creatinine: 0.99 mg/dL (ref 0.61–1.24)
GFR, Estimated: >60 mL/min (ref >60)
Glucose, Bld: 128 mg/dL — ABNORMAL HIGH (ref 70–99)
Potassium: 4.4 mmol/L (ref 3.5–5.1)
Sodium: 136 mmol/L (ref 135–145)
Total Bilirubin: 0.4 mg/dL (ref 0.0–1.2)
Total Protein: 8 g/dL (ref 6.5–8.1)

## 2023-08-14 LAB — LACTATE DEHYDROGENASE: LDH: 107 U/L (ref 98–192)

## 2023-08-14 NOTE — Progress Notes (Signed)
 Hematology and Oncology Follow Up Visit  [name] [name] [id number] 01/28/1956 68 y.o. 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  We did go ahead and do a bone marrow biopsy on him.  This because of an increase in his myeloma studies.  The bone marrow biopsy was done on 07/14/2023.  The pathology report ([id number]) showed only 5-8% plasma cells.  I think this is consistent with the smoldering myeloma.  At this point, he is doing pretty well.  He feels okay.  He really has had no specific complaints.  When we last saw him, his monoclonal spike was 1.3 g/dL.  The IgG level was 2000 mg/dL.  The Kappa light chain was 16.9 mg/dL.  Again, I think we can just watch.  I think that he and his wife will be going to [place] in April for a river cruise.  He has had no fever.  He has had no rashes.  He has had no bony pain.  He has had no cough or shortness of breath.  He has had no change in bowel or bladder habits.  He does have some chronic constipation.  Currently, I would say that his performance status is probably ECOG 1.   Medications:  Allergies as of 08/14/2023       Reactions   Aripiprazole Other (See Comments)    leg aching   Codeine Hives   Duloxetine Other (See Comments)   Other reaction(s): ineffective   Duloxetine Hcl Other (See Comments)   Fluoxetine Other (See Comments)   Other reaction(s): ineffective   Other Other (See Comments)   Simvastatin Other (See Comments)        Medication List        Accurate as of August 14, 2023 12:13 PM. If you have any questions, ask your nurse or doctor.          ALPRAZolam 0.5 MG tablet Commonly known as: XANAX Take 0.5 mg by mouth as directed.   clopidogrel 75 MG tablet Commonly known as: PLAVIX Take 75 mg by mouth daily.   EPINEPHrine 0.3 mg/0.3 mL Soaj injection Commonly known as: EPI-PEN    FLUoxetine 20 MG capsule Commonly known as: PROZAC Take 20 mg by mouth daily at 6 (six) AM.   gabapentin 300 MG capsule Commonly known as: NEURONTIN TAKE 3 TABS BY MOUTH IN THE  AM & 4 TABS BY MOUTH IN THE PM   metFORMIN 500 MG tablet Commonly known as: GLUCOPHAGE Take 1,000 mg by mouth 2 (two) times daily.   morphine 30 MG 12 hr tablet Commonly known as: MS CONTIN Take 30 mg by mouth every 8 (eight) hours. What changed: Another medication with the same name was removed. Continue taking this medication, and follow the directions you see here. Changed by: [name] [name]   nitroGLYCERIN 0.4 MG SL tablet Commonly known as: NITROSTAT Place 1 tablet (0.4 mg total) under the tongue every 5 (five) minutes as needed for chest pain.   OLANZapine 2.5 MG tablet Commonly known as: ZYPREXA Take 2.5 mg by mouth daily.   Ozempic (1 MG/DOSE) 2 MG/1.5ML Sopn Generic drug: Semaglutide (1 MG/DOSE) Inject 1 mg into the skin once a week.   pantoprazole 40 MG tablet Commonly known as: PROTONIX Take 1 tablet (40 mg total) by mouth daily.   Percocet 10-325 MG tablet Generic drug:  oxyCODONE-acetaminophen Take 1 tablet by mouth 3 (three) times daily as needed.   rosuvastatin 20 MG tablet Commonly known as: CRESTOR Take 20 mg by mouth daily.   sitaGLIPtin 100 MG tablet Commonly known as: JANUVIA Take 100 mg by mouth daily.   zolpidem 10 MG tablet Commonly known as: AMBIEN Take 10 mg by mouth at bedtime as needed. What changed: Another medication with the same name was removed. Continue taking this medication, and follow the directions you see here. Changed by: [name] [name]        Allergies:  Allergies  Allergen Reactions   Aripiprazole Other (See Comments)     leg aching   Codeine Hives   Duloxetine Other (See Comments)    Other reaction(s): ineffective   Duloxetine Hcl Other (See Comments)   Fluoxetine Other (See Comments)    Other reaction(s): ineffective   Other Other  (See Comments)   Simvastatin Other (See Comments)    Past Medical History, Surgical history, Social history, and Family History were reviewed and updated.  Review of Systems: Review of Systems  Constitutional: Negative.   HENT: Negative.    Eyes: Negative.   Respiratory: Negative.    Cardiovascular: Negative.   Gastrointestinal: Negative.   Genitourinary: Negative.   Musculoskeletal:  Positive for back pain and myalgias.  Skin:  Positive for rash.  Neurological:  Positive for sensory change.  Psychiatric/Behavioral:  The patient is nervous/anxious.     Physical Exam:  height is 6' (1.829 m) and weight is 162 lb (73.5 kg). His oral temperature is 98 F (36.7 C). His blood pressure is 101/75 and his pulse is 70. His respiration is 16 and oxygen saturation is 98%.   Wt Readings from Last 3 Encounters:  08/14/23 162 lb (73.5 kg)  06/27/23 159 lb 12.8 oz (72.5 kg)  12/26/22 167 lb (75.8 kg)    Physical Exam Vitals reviewed.  HENT:     Head: Normocephalic and atraumatic.  Eyes:     Pupils: Pupils are equal, round, and reactive to light.  Cardiovascular:     Rate and Rhythm: Normal rate and regular rhythm.     Heart sounds: Normal heart sounds.  Pulmonary:     Effort: Pulmonary effort is normal.     Breath sounds: Normal breath sounds.  Abdominal:     General: Bowel sounds are normal.     Palpations: Abdomen is soft.  Musculoskeletal:        General: No tenderness or deformity. Normal range of motion.     Cervical back: Normal range of motion.  Lymphadenopathy:     Cervical: No cervical adenopathy.  Skin:    General: Skin is warm and dry.     Findings: No erythema or rash.  Neurological:     Mental Status: He is alert and oriented to person, place, and time.  Psychiatric:        Behavior: Behavior normal.        Thought Content: Thought content normal.        Judgment: Judgment normal.     Lab Results  Component Value Date   WBC 6.5 08/14/2023   HGB 13.0  08/14/2023   HCT 39.4 08/14/2023   MCV 94.3 08/14/2023   PLT 232 08/14/2023   Lab Results  Component Value Date   FERRITIN 73.9 11/20/2009   IRON 76 11/20/2009   IRONPCTSAT 22.8 11/20/2009   Lab Results  Component Value Date   RBC 4.18 (L) 08/14/2023   Lab Results  Component Value Date   KPAFRELGTCHN 169.0 (H) 06/27/2023   LAMBDASER 7.9 06/27/2023   KAPLAMBRATIO 21.39 (H) 06/27/2023   Lab Results  Component Value Date   IGGSERUM 1,966 (H) 06/27/2023   IGGSERUM 4,054 (H) 06/27/2023   IGA 89 06/27/2023   IGA 94 06/27/2023   IGMSERUM 19 (L) 06/27/2023   IGMSERUM 21 06/27/2023   Lab Results  Component Value Date   TOTALPROTELP 8.0 06/27/2023   ALBUMINELP 4.1 06/27/2023   A1GS 0.3 06/27/2023   A2GS 0.9 06/27/2023   BETS 1.1 06/27/2023   GAMS 1.6 06/27/2023   MSPIKE 1.3 (H) 06/27/2023     Chemistry      Component Value Date/Time   NA 136 08/14/2023 1032   NA 145 05/07/2017 0945   NA 139 07/25/2016 0815   K 4.4 08/14/2023 1032   K 3.9 05/07/2017 0945   K 4.0 07/25/2016 0815   CL 99 08/14/2023 1032   CL 100 05/07/2017 0945   CO2 29 08/14/2023 1032   CO2 29 05/07/2017 0945   CO2 28 07/25/2016 0815   BUN 13 08/14/2023 1032   BUN 8 05/07/2017 0945   BUN 9.0 07/25/2016 0815   CREATININE 0.99 08/14/2023 1032   CREATININE 0.8 05/07/2017 0945   CREATININE 0.8 07/25/2016 0815      Component Value Date/Time   CALCIUM 9.3 08/14/2023 1032   CALCIUM 9.7 05/07/2017 0945   CALCIUM 9.6 07/25/2016 0815   ALKPHOS 66 08/14/2023 1032   ALKPHOS 60 05/07/2017 0945   ALKPHOS 57 07/25/2016 0815   AST 14 (L) 08/14/2023 1032   AST 15 07/25/2016 0815   ALT 8 08/14/2023 1032   ALT 20 05/07/2017 0945   ALT 13 07/25/2016 0815   BILITOT 0.4 08/14/2023 1032   BILITOT 0.55 07/25/2016 0815     Impression and Plan: Mr. Wadhwa is a 68 yo white male with an IgG Kappa smoldering myeloma diagnosed 12 years ago. He was treated initially with Velcade and Decadron and then Revlimid.   I  had to believe that everything is going be okay right now.  His total protein is down a little bit.  He has normal calcium.  His normal creatinine.  He is not anemic..  I still think we can just follow along.  I do not think we need to treat.  I would really like to see him back in about 3 months now.  Hopefully, we can then get him through the Summer.   3/13/202512:13 PM

## 2023-08-15 LAB — KAPPA/LAMBDA LIGHT CHAINS
Kappa free light chain: 148.9 mg/L — ABNORMAL HIGH (ref 3.3–19.4)
Kappa, lambda light chain ratio: 16.73 — ABNORMAL HIGH (ref 0.26–1.65)
Lambda free light chains: 8.9 mg/L (ref 5.7–26.3)

## 2023-08-15 LAB — IGG, IGA, IGM
IgA: 82 mg/dL (ref 61–437)
IgG (Immunoglobin G), Serum: 1682 mg/dL — ABNORMAL HIGH (ref 603–1613)
IgM (Immunoglobulin M), Srm: 20 mg/dL (ref 20–172)

## 2023-08-21 LAB — PROTEIN ELECTROPHORESIS, SERUM, WITH REFLEX
A/G Ratio: 1 (ref 0.7–1.7)
Albumin ELP: 3.7 g/dL (ref 2.9–4.4)
Alpha-1-Globulin: 0.3 g/dL (ref 0.0–0.4)
Alpha-2-Globulin: 0.9 g/dL (ref 0.4–1.0)
Beta Globulin: 0.9 g/dL (ref 0.7–1.3)
Gamma Globulin: 1.4 g/dL (ref 0.4–1.8)
Globulin, Total: 3.6 g/dL (ref 2.2–3.9)
M-Spike, %: 1.1 g/dL — ABNORMAL HIGH
SPEP Interpretation: 0
Total Protein ELP: 7.3 g/dL (ref 6.0–8.5)

## 2023-08-21 LAB — IMMUNOFIXATION REFLEX, SERUM
IgA: 84 mg/dL (ref 61–437)
IgG (Immunoglobin G), Serum: 1799 mg/dL — ABNORMAL HIGH (ref 603–1613)
IgM (Immunoglobulin M), Srm: 20 mg/dL (ref 20–172)

## 2023-09-16 DIAGNOSIS — H9201 Otalgia, right ear: Secondary | ICD-10-CM | POA: Diagnosis not present

## 2023-10-10 DIAGNOSIS — M549 Dorsalgia, unspecified: Secondary | ICD-10-CM | POA: Diagnosis not present

## 2023-10-10 DIAGNOSIS — E1142 Type 2 diabetes mellitus with diabetic polyneuropathy: Secondary | ICD-10-CM | POA: Diagnosis not present

## 2023-10-10 DIAGNOSIS — Z0000 Encounter for general adult medical examination without abnormal findings: Secondary | ICD-10-CM | POA: Diagnosis not present

## 2023-10-10 DIAGNOSIS — C9000 Multiple myeloma not having achieved remission: Secondary | ICD-10-CM | POA: Diagnosis not present

## 2023-10-10 DIAGNOSIS — E782 Mixed hyperlipidemia: Secondary | ICD-10-CM | POA: Diagnosis not present

## 2023-10-10 DIAGNOSIS — I119 Hypertensive heart disease without heart failure: Secondary | ICD-10-CM | POA: Diagnosis not present

## 2023-10-10 DIAGNOSIS — I2510 Atherosclerotic heart disease of native coronary artery without angina pectoris: Secondary | ICD-10-CM | POA: Diagnosis not present

## 2023-10-10 DIAGNOSIS — K219 Gastro-esophageal reflux disease without esophagitis: Secondary | ICD-10-CM | POA: Diagnosis not present

## 2023-10-10 DIAGNOSIS — R5382 Chronic fatigue, unspecified: Secondary | ICD-10-CM | POA: Diagnosis not present

## 2023-10-10 LAB — LAB REPORT - SCANNED
A1c: 7.1
EGFR (Non-African Amer.): 95
PSA, Total: 2.03

## 2023-10-15 DIAGNOSIS — G629 Polyneuropathy, unspecified: Secondary | ICD-10-CM | POA: Diagnosis not present

## 2023-10-15 DIAGNOSIS — G894 Chronic pain syndrome: Secondary | ICD-10-CM | POA: Diagnosis not present

## 2023-10-15 DIAGNOSIS — M62830 Muscle spasm of back: Secondary | ICD-10-CM | POA: Diagnosis not present

## 2023-10-15 DIAGNOSIS — M461 Sacroiliitis, not elsewhere classified: Secondary | ICD-10-CM | POA: Diagnosis not present

## 2023-10-16 ENCOUNTER — Ambulatory Visit: Payer: Self-pay | Admitting: Cardiovascular Disease

## 2023-10-20 NOTE — Progress Notes (Signed)
 CARDIOLOGY CONSULT NOTE       Patient ID: [name] [name] MRN: [id number] DOB/AGE: 11/08/1955 68 y.o.  Primary Physician: [name] Landau, MD Primary Cardiologist: [name] [name]  Reason for Consultation: CAD     HPI:  68 y.o. history of CAD, HTN, HLD Distant history of stenting circumflex and IM branches Actively being RX for myeloma by [name] [name] [name]. Sees [name] [name] [name] for COPD Diabetes somewhat poorly controlled  Myovue 2015 non ischemic EF has been normal in past   Myovue 11/12/21 diaphragmatic attenuation no ischemia EF 59%   Active no agnina His mother who I also cared for died 12/07/17 He helps care for his nephew with Down's Syndrome He passed away this year in [place]  Had a river cruise in [place] April 2025 with [place] tours Did the [place]  Narcotic dependent back pain Retired from [employer]   He has had some stress lately Relative passed and a 68 yo downs syndrome relative is now living with him and his wife    Moderate control of DM with A1c 6.8    No angina dyspnea or syncope compliant with meds   Sees [name] [name] for his myeloma BMB 07/14/23 with 5-8% plasma cells. Consistent with smoldering myeloma  No angina. Had chest pain with his original MI Has fresh nitroglycerin      ROS All other systems reviewed and negative except as noted above  Past Medical History:  Diagnosis Date   CAD (coronary artery disease)    stents 1997-12-07 and '05; patent circ and IM stents cath. myoview  10/08 non ischemic   Chronic back pain    Depression    DM (diabetes mellitus) (HCC)    GERD (gastroesophageal reflux disease)    HLD (hyperlipidemia)    Insomnia    Multiple myeloma    dx 11/11. followed by [name]. Remonia [name] in [place]   OSA (obstructive sleep apnea)    AHI 5 from home sleep test 10/05/09   Peripheral neuropathy    Prostatism    RLS (restless legs syndrome)    responded to iron supplementation    Family History  Problem Relation Age of Onset   Heart disease Father    Diabetes  Father    Hypertension Mother     Social History   Socioeconomic History   Marital status: Married    Spouse name: Not on file   Number of children: Not on file   Years of education: Not on file   Highest education level: Not on file  Occupational History   Not on file  Tobacco Use   Smoking status: Former   Smokeless tobacco: Never   Tobacco comments:    no intake of tobacco produtcs; does chew nicotene gum   Vaping Use   Vaping status: Never Used  Substance and Sexual Activity   Alcohol use: No   Drug use: Never   Sexual activity: Not on file  Other Topics Concern   Not on file  Social History Narrative   Married, no children; business owner; 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    Social Drivers of Corporate investment banker Strain: Not on file  Food Insecurity: Not on file  Transportation Needs: Not on file  Physical Activity: Not on file  Stress: Not on file  Social Connections: Not on file  Intimate Partner Violence: Not on file    Past Surgical History:  Procedure Laterality Date   IR REMOVAL TUN ACCESS W/ PORT W/O FL MOD SED  12/11/2016   IR TRANSCATH RETRIEVAL FB INCL GUIDANCE (MS)  12/11/2016   IR US  GUIDE VASC ACCESS RIGHT  12/11/2016   L4-5 epidural steriod injection     with fluoroscopic guidance   release of rt long finger A1     pulley with debridement of tenosynovitis   stent placed     95% lesion in AV circumflex that was dilated and stented with an AVE stent. also had 60% lesion in the OM-I and minor disease in LAD and RCA 02/14/98. [name] [name] cc [name]. Lavonne [name]         Physical Exam: Blood pressure 110/76, pulse 68, height 6' (1.829 m), weight 161 lb (73 kg), SpO2 96%.    Affect appropriate Healthy:  appears stated age HEENT: normal Neck supple with no adenopathy JVP normal no bruits no thyromegaly Lungs clear with no wheezing and good diaphragmatic motion Heart:  S1/S2 no murmur, no rub, gallop or click PMI normal Abdomen: benighn,  BS positve, no tenderness, no AAA no bruit.  No HSM or HJR Distal pulses intact with no bruits No edema Neuro non-focal Skin warm and dry No muscular weakness   Labs:   Lab Results  Component Value Date   WBC 6.5 08/14/2023   HGB 13.0 08/14/2023   HCT 39.4 08/14/2023   MCV 94.3 08/14/2023   PLT 232 08/14/2023   No results for input(s): "NA", "K", "CL", "CO2", "BUN", "CREATININE", "CALCIUM", "PROT", "BILITOT", "ALKPHOS", "ALT", "AST", "GLUCOSE" in the last 168 hours.  Invalid input(s): "LABALBU" No results found for: "CKTOTAL", "CKMB", "CKMBINDEX", "TROPONINI" No results found for: "CHOL" No results found for: "HDL" No results found for: "LDLCALC" No results found for: "TRIG" No results found for: "CHOLHDL" No results found for: "LDLDIRECT"    Radiology: No results found.   EKG: 6.6/23 SR rate 59 normal    ASSESSMENT AND PLAN:   1. CAD:  Distant history of circumflex/IM stents. Last non ischemic myovue 11/12/21 ECG today normal New nitro called in  2.  DM:  Discussed low carb diet.  Target hemoglobin A1c is 6.5 or less.  Continue current medications. 3.  HTN:  Well controlled.  Continue current medications and low sodium Dash type diet.   4.  HLD:  Continue statin labs with primary 5.  Myeloma :  F/u [name] [name]  BMB 07/2023 5-8% plasma cells no active Rx currently  6. Back Pain:  post L4-S1 fusion on MS Contine   F/U in a year   Signed: Janelle [name] 11/03/2023, 10:45 AM

## 2023-11-03 ENCOUNTER — Ambulatory Visit: Attending: Cardiovascular Disease | Admitting: Cardiovascular Disease

## 2023-11-03 VITALS — BP 110/76 | HR 68 | Ht 72.0 in | Wt 161.0 lb

## 2023-11-03 DIAGNOSIS — I10 Essential (primary) hypertension: Secondary | ICD-10-CM | POA: Diagnosis not present

## 2023-11-03 DIAGNOSIS — E782 Mixed hyperlipidemia: Secondary | ICD-10-CM

## 2023-11-03 DIAGNOSIS — I2510 Atherosclerotic heart disease of native coronary artery without angina pectoris: Secondary | ICD-10-CM | POA: Diagnosis not present

## 2023-11-03 NOTE — Patient Instructions (Signed)
 Medication Instructions:  Your physician recommends that you continue on your current medications as directed. Please refer to the Current Medication list given to you today.  *If you need a refill on your cardiac medications before your next appointment, please call your pharmacy*  Lab Work: NONE   If you have labs (blood work) drawn today and your tests are completely normal, you will receive your results only by: MyChart Message (if you have MyChart) OR A paper copy in the mail If you have any lab test that is abnormal or we need to change your treatment, we will call you to review the results.  Testing/Procedures: NONE   Follow-Up: At [facility], you and your health needs are our priority.  As part of our continuing mission to provide you with exceptional heart care, our providers are all part of one team.  This team includes your primary Cardiologist (physician) and Advanced Practice Providers or APPs (Physician Assistants and Nurse Practitioners) who all work together to provide you with the care you need, when you need it.  Your next appointment:   1 year(s)  Provider:   You may see Janelle Mediate, MD or one of the following Advanced Practice Providers on your designated Care Team:   Woodfin Hays, PA-C  [place], [place] Theotis Flake, [place]     We recommend signing up for the patient portal called "MyChart".  Sign up information is provided on this After Visit Summary.  MyChart is used to connect with patients for Virtual Visits (Telemedicine).  Patients are able to view lab/test results, encounter notes, upcoming appointments, etc.  Non-urgent messages can be sent to your provider as well.   To learn more about what you can do with MyChart, go to [email or web address].   Other Instructions Thank you for choosing [facility]!

## 2023-11-11 DIAGNOSIS — Z1211 Encounter for screening for malignant neoplasm of colon: Secondary | ICD-10-CM | POA: Diagnosis not present

## 2023-11-12 ENCOUNTER — Encounter: Payer: Self-pay | Admitting: Hematology & Oncology

## 2023-11-12 ENCOUNTER — Inpatient Hospital Stay: Attending: Hematology & Oncology

## 2023-11-12 ENCOUNTER — Inpatient Hospital Stay: Admitting: Hematology & Oncology

## 2023-11-12 VITALS — BP 126/82 | HR 63 | Temp 97.8°F | Resp 20 | Ht 72.0 in | Wt 157.8 lb

## 2023-11-12 DIAGNOSIS — D472 Monoclonal gammopathy: Secondary | ICD-10-CM

## 2023-11-12 LAB — CMP (CANCER CENTER ONLY)
ALT: 12 U/L (ref 0–44)
AST: 15 U/L (ref 15–41)
Albumin: 4.5 g/dL (ref 3.5–5.0)
Alkaline Phosphatase: 61 U/L (ref 38–126)
Anion gap: 8 (ref 5–15)
BUN: 10 mg/dL (ref 8–23)
CO2: 30 mmol/L (ref 22–32)
Calcium: 9.1 mg/dL (ref 8.9–10.3)
Chloride: 100 mmol/L (ref 98–111)
Creatinine: 0.94 mg/dL (ref 0.61–1.24)
GFR, Estimated: >60 mL/min (ref >60)
Glucose, Bld: 150 mg/dL — ABNORMAL HIGH (ref 70–99)
Potassium: 4.2 mmol/L (ref 3.5–5.1)
Sodium: 138 mmol/L (ref 135–145)
Total Bilirubin: 0.3 mg/dL (ref 0.0–1.2)
Total Protein: 7.7 g/dL (ref 6.5–8.1)

## 2023-11-12 LAB — CBC WITH DIFFERENTIAL (CANCER CENTER ONLY)
Abs Immature Granulocytes: 0.02 10*3/uL (ref 0.00–0.07)
Basophils Absolute: 0 10*3/uL (ref 0.0–0.1)
Basophils Relative: 0 %
Eosinophils Absolute: 0.2 10*3/uL (ref 0.0–0.5)
Eosinophils Relative: 2 %
HCT: 37.8 % — ABNORMAL LOW (ref 39.0–52.0)
Hemoglobin: 12.6 g/dL — ABNORMAL LOW (ref 13.0–17.0)
Immature Granulocytes: 0 %
Lymphocytes Relative: 32 %
Lymphs Abs: 2.3 10*3/uL (ref 0.7–4.0)
MCH: 30.7 pg (ref 26.0–34.0)
MCHC: 33.3 g/dL (ref 30.0–36.0)
MCV: 92.2 fL (ref 80.0–100.0)
Monocytes Absolute: 0.5 10*3/uL (ref 0.1–1.0)
Monocytes Relative: 8 %
Neutro Abs: 4.2 10*3/uL (ref 1.7–7.7)
Neutrophils Relative %: 58 %
Platelet Count: 283 10*3/uL (ref 150–400)
RBC: 4.1 MIL/uL — ABNORMAL LOW (ref 4.22–5.81)
RDW: 12.6 % (ref 11.5–15.5)
WBC Count: 7.2 10*3/uL (ref 4.0–10.5)
nRBC: 0 % (ref 0.0–0.2)

## 2023-11-12 LAB — LACTATE DEHYDROGENASE: LDH: 106 U/L (ref 98–192)

## 2023-11-12 NOTE — Progress Notes (Signed)
 Hematology and Oncology Follow Up Visit  [name] [name] [id number] [birth year] 68 y.o. 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  We last saw him back in March.  Since then, he has been doing fairly well.  He really has had no problems with respect to this smoldering myeloma.  [name] [name] does have chronic back issues.  This is not from the myeloma.  He has had no problems with nausea or vomiting.  He does have issues with his stomach however.  He has had a lot of dyspepsia.  This could certainly be from medications.  He does see Gastroenterology.  I think he will see Dr. Lavaughn Portland on in a couple weeks.  Thankfully, his myeloma studies have not been a problem.  When we last saw him, his monoclonal spike was 1.1 g/dL.  His IgG level was 1740 mg/dL.  His kappa light chain was 14.9 mg/dL.  He has had no bleeding.  There is been no obvious change in bowel or bladder habits.  He has had no leg swelling.  He has had no rashes.  Overall, I would have to say that his performance status is probably ECOG 1.    Medications:  Allergies as of 11/12/2023       Reactions   Aripiprazole Other (See Comments)    leg aching   Codeine Hives   Duloxetine Other (See Comments)   Other reaction(s): ineffective   Duloxetine Hcl Other (See Comments)   Fluoxetine Other (See Comments)   Other reaction(s): ineffective   Other Other (See Comments)   Simvastatin  Other (See Comments)        Medication List        Accurate as of November 12, 2023 11:06 AM. If you have any questions, ask your nurse or doctor.          STOP taking these medications    EPINEPHrine  0.3 mg/0.3 mL Soaj injection Commonly known as: EPI-PEN Stopped by: Ivor Mars       TAKE these medications    ALPRAZolam 0.5 MG tablet Commonly known as: XANAX Take 0.5 mg by mouth daily as needed.    clopidogrel  75 MG tablet Commonly known as: PLAVIX  Take 75 mg by mouth daily.   FLUoxetine 20 MG capsule Commonly known as: PROZAC Take 20 mg by mouth daily at 6 (six) AM.   gabapentin 300 MG capsule Commonly known as: NEURONTIN 11/12/2023 TAKE 3 TABS BY MOUTH IN THE  AM & 2 TABS BY MOUTH IN THE PM   metFORMIN 500 MG tablet Commonly known as: GLUCOPHAGE Take 1,000 mg by mouth 2 (two) times daily.   morphine 30 MG 12 hr tablet Commonly known as: MS CONTIN Take 30 mg by mouth every 8 (eight) hours.   nitroGLYCERIN  0.4 MG SL tablet Commonly known as: NITROSTAT  Place 1 tablet (0.4 mg total) under the tongue every 5 (five) minutes as needed for chest pain.   OLANZapine 2.5 MG tablet Commonly known as: ZYPREXA Take 2.5 mg by mouth daily.   OneTouch Delica Plus Lancet33G Misc USE 2 TO 3 TIMES DAILY FOR BLOOD SUGAR CHECK for 100   OneTouch Verio test strip Generic drug: glucose blood USE 1 STRIP TWICE DAILY for 100   Ozempic (1 MG/DOSE) 2 MG/1.5ML Sopn Generic drug: Semaglutide (1 MG/DOSE) Inject 1 mg into the skin once a week.  pantoprazole  40 MG tablet Commonly known as: PROTONIX  Take 1 tablet (40 mg total) by mouth daily.   Percocet 10-325 MG tablet Generic drug: oxyCODONE-acetaminophen Take 1 tablet by mouth 3 (three) times daily as needed.   polyethylene glycol 17 g packet Commonly known as: MIRALAX / GLYCOLAX Take 17 g by mouth daily as needed.   rosuvastatin 20 MG tablet Commonly known as: CRESTOR Take 20 mg by mouth daily.   sitaGLIPtin 100 MG tablet Commonly known as: JANUVIA Take 100 mg by mouth daily.   zolpidem 10 MG tablet Commonly known as: AMBIEN Take 10 mg by mouth at bedtime as needed.        Allergies:  Allergies  Allergen Reactions   Aripiprazole Other (See Comments)     leg aching   Codeine Hives   Duloxetine Other (See Comments)    Other reaction(s): ineffective   Duloxetine Hcl Other (See Comments)   Fluoxetine Other (See  Comments)    Other reaction(s): ineffective   Other Other (See Comments)   Simvastatin  Other (See Comments)    Past Medical History, Surgical history, Social history, and Family History were reviewed and updated.  Review of Systems: Review of Systems  Constitutional: Negative.   HENT: Negative.    Eyes: Negative.   Respiratory: Negative.    Cardiovascular: Negative.   Gastrointestinal: Negative.   Genitourinary: Negative.   Musculoskeletal:  Positive for back pain and myalgias.  Skin:  Positive for rash.  Neurological:  Positive for sensory change.  Psychiatric/Behavioral:  The patient is nervous/anxious.     Physical Exam:  height is 6' (1.829 m) and weight is 157 lb 12.8 oz (71.6 kg). His oral temperature is 97.8 F (36.6 C). His blood pressure is 126/82 and his pulse is 63. His respiration is 20 and oxygen saturation is 99%.   Wt Readings from Last 3 Encounters:  11/12/23 157 lb 12.8 oz (71.6 kg)  11/03/23 161 lb (73 kg)  08/14/23 162 lb (73.5 kg)    Physical Exam Vitals reviewed.  HENT:     Head: Normocephalic and atraumatic.  Eyes:     Pupils: Pupils are equal, round, and reactive to light.  Cardiovascular:     Rate and Rhythm: Normal rate and regular rhythm.     Heart sounds: Normal heart sounds.  Pulmonary:     Effort: Pulmonary effort is normal.     Breath sounds: Normal breath sounds.  Abdominal:     General: Bowel sounds are normal.     Palpations: Abdomen is soft.  Musculoskeletal:        General: No tenderness or deformity. Normal range of motion.     Cervical back: Normal range of motion.  Lymphadenopathy:     Cervical: No cervical adenopathy.  Skin:    General: Skin is warm and dry.     Findings: No erythema or rash.  Neurological:     Mental Status: He is alert and oriented to person, place, and time.  Psychiatric:        Behavior: Behavior normal.        Thought Content: Thought content normal.        Judgment: Judgment normal.     Lab  Results  Component Value Date   WBC 7.2 11/12/2023   HGB 12.6 (L) 11/12/2023   HCT 37.8 (L) 11/12/2023   MCV 92.2 11/12/2023   PLT 283 11/12/2023   Lab Results  Component Value Date   FERRITIN 73.9 11/20/2009   IRON 76  11/20/2009   IRONPCTSAT 22.8 11/20/2009   Lab Results  Component Value Date   RBC 4.10 (L) 11/12/2023   Lab Results  Component Value Date   KPAFRELGTCHN 148.9 (H) 08/14/2023   LAMBDASER 8.9 08/14/2023   KAPLAMBRATIO 16.73 (H) 08/14/2023   Lab Results  Component Value Date   IGGSERUM 1,682 (H) 08/14/2023   IGGSERUM 1,799 (H) 08/14/2023   IGA 82 08/14/2023   IGA 84 08/14/2023   IGMSERUM 20 08/14/2023   IGMSERUM 20 08/14/2023   Lab Results  Component Value Date   TOTALPROTELP 7.3 08/14/2023   ALBUMINELP 3.7 08/14/2023   A1GS 0.3 08/14/2023   A2GS 0.9 08/14/2023   BETS 0.9 08/14/2023   GAMS 1.4 08/14/2023   MSPIKE 1.1 (H) 08/14/2023     Chemistry      Component Value Date/Time   NA 138 11/12/2023 1005   NA 145 05/07/2017 0945   NA 139 07/25/2016 0815   K 4.2 11/12/2023 1005   K 3.9 05/07/2017 0945   K 4.0 07/25/2016 0815   CL 100 11/12/2023 1005   CL 100 05/07/2017 0945   CO2 30 11/12/2023 1005   CO2 29 05/07/2017 0945   CO2 28 07/25/2016 0815   BUN 10 11/12/2023 1005   BUN 8 05/07/2017 0945   BUN 9.0 07/25/2016 0815   CREATININE 0.94 11/12/2023 1005   CREATININE 0.8 05/07/2017 0945   CREATININE 0.8 07/25/2016 0815      Component Value Date/Time   CALCIUM 9.1 11/12/2023 1005   CALCIUM 9.7 05/07/2017 0945   CALCIUM 9.6 07/25/2016 0815   ALKPHOS 61 11/12/2023 1005   ALKPHOS 60 05/07/2017 0945   ALKPHOS 57 07/25/2016 0815   AST 15 11/12/2023 1005   AST 15 07/25/2016 0815   ALT 12 11/12/2023 1005   ALT 20 05/07/2017 0945   ALT 13 07/25/2016 0815   BILITOT 0.3 11/12/2023 1005   BILITOT 0.55 07/25/2016 0815     Impression and Plan: [name] [name] is a 68 yo white male with an IgG Kappa smoldering myeloma diagnosed 12 years ago. He was  treated initially with Velcade and Decadron  and then Revlimid.   Given the fact that his total protein is down a little bit, I have to believe that his lab work will be okay with respect to the myeloma.  As always, he and his wife travel.  Since we last saw him, they were in [place].  I think this summer, they probably will go out west to [facility].  I would like to see him back in about 4 months.  Will get him back in the Fall.    6/11/202511:06 AM

## 2023-11-13 LAB — KAPPA/LAMBDA LIGHT CHAINS
Kappa free light chain: 173.6 mg/L — ABNORMAL HIGH (ref 3.3–19.4)
Kappa, lambda light chain ratio: 15.5 — ABNORMAL HIGH (ref 0.26–1.65)
Lambda free light chains: 11.2 mg/L (ref 5.7–26.3)

## 2023-11-13 LAB — IGG, IGA, IGM
IgA: 83 mg/dL (ref 61–437)
IgG (Immunoglobin G), Serum: 1617 mg/dL — ABNORMAL HIGH (ref 603–1613)
IgM (Immunoglobulin M), Srm: 19 mg/dL — ABNORMAL LOW (ref 20–172)

## 2023-11-17 LAB — IMMUNOFIXATION REFLEX, SERUM
IgA: 81 mg/dL (ref 61–437)
IgG (Immunoglobin G), Serum: 1651 mg/dL — ABNORMAL HIGH (ref 603–1613)
IgM (Immunoglobulin M), Srm: 20 mg/dL (ref 20–172)

## 2023-11-17 LAB — PROTEIN ELECTROPHORESIS, SERUM, WITH REFLEX
A/G Ratio: 1 (ref 0.7–1.7)
Albumin ELP: 3.7 g/dL (ref 2.9–4.4)
Alpha-1-Globulin: 0.3 g/dL (ref 0.0–0.4)
Alpha-2-Globulin: 0.9 g/dL (ref 0.4–1.0)
Beta Globulin: 1 g/dL (ref 0.7–1.3)
Gamma Globulin: 1.4 g/dL (ref 0.4–1.8)
Globulin, Total: 3.6 g/dL (ref 2.2–3.9)
M-Spike, %: 1.2 g/dL — ABNORMAL HIGH
SPEP Interpretation: 0
Total Protein ELP: 7.3 g/dL (ref 6.0–8.5)

## 2023-11-20 DIAGNOSIS — K5909 Other constipation: Secondary | ICD-10-CM | POA: Diagnosis not present

## 2023-11-20 DIAGNOSIS — K9289 Other specified diseases of the digestive system: Secondary | ICD-10-CM | POA: Diagnosis not present

## 2023-12-10 DIAGNOSIS — G629 Polyneuropathy, unspecified: Secondary | ICD-10-CM | POA: Diagnosis not present

## 2023-12-10 DIAGNOSIS — M62830 Muscle spasm of back: Secondary | ICD-10-CM | POA: Diagnosis not present

## 2023-12-10 DIAGNOSIS — M461 Sacroiliitis, not elsewhere classified: Secondary | ICD-10-CM | POA: Diagnosis not present

## 2023-12-10 DIAGNOSIS — G894 Chronic pain syndrome: Secondary | ICD-10-CM | POA: Diagnosis not present

## 2023-12-19 ENCOUNTER — Encounter: Payer: Self-pay | Admitting: Advanced Practice Midwife

## 2023-12-25 ENCOUNTER — Ambulatory Visit: Payer: Medicare Other | Admitting: Hematology & Oncology

## 2023-12-25 ENCOUNTER — Other Ambulatory Visit: Payer: Medicare Other

## 2023-12-26 ENCOUNTER — Other Ambulatory Visit: Payer: Medicare Other

## 2023-12-26 ENCOUNTER — Ambulatory Visit: Payer: Medicare Other | Admitting: Hematology & Oncology

## 2024-01-07 DIAGNOSIS — M461 Sacroiliitis, not elsewhere classified: Secondary | ICD-10-CM | POA: Diagnosis not present

## 2024-01-07 DIAGNOSIS — G629 Polyneuropathy, unspecified: Secondary | ICD-10-CM | POA: Diagnosis not present

## 2024-01-07 DIAGNOSIS — M62830 Muscle spasm of back: Secondary | ICD-10-CM | POA: Diagnosis not present

## 2024-01-07 DIAGNOSIS — G894 Chronic pain syndrome: Secondary | ICD-10-CM | POA: Diagnosis not present

## 2024-01-22 DIAGNOSIS — B351 Tinea unguium: Secondary | ICD-10-CM | POA: Diagnosis not present

## 2024-01-22 DIAGNOSIS — E1142 Type 2 diabetes mellitus with diabetic polyneuropathy: Secondary | ICD-10-CM | POA: Diagnosis not present

## 2024-01-22 DIAGNOSIS — M2012 Hallux valgus (acquired), left foot: Secondary | ICD-10-CM | POA: Diagnosis not present

## 2024-01-22 DIAGNOSIS — M21622 Bunionette of left foot: Secondary | ICD-10-CM | POA: Diagnosis not present

## 2024-01-22 DIAGNOSIS — M21621 Bunionette of right foot: Secondary | ICD-10-CM | POA: Diagnosis not present

## 2024-01-22 DIAGNOSIS — I70203 Unspecified atherosclerosis of native arteries of extremities, bilateral legs: Secondary | ICD-10-CM | POA: Diagnosis not present

## 2024-01-22 DIAGNOSIS — M2011 Hallux valgus (acquired), right foot: Secondary | ICD-10-CM | POA: Diagnosis not present

## 2024-01-31 DIAGNOSIS — B351 Tinea unguium: Secondary | ICD-10-CM | POA: Diagnosis not present

## 2024-03-17 ENCOUNTER — Inpatient Hospital Stay: Admitting: Hematology & Oncology

## 2024-03-17 ENCOUNTER — Inpatient Hospital Stay: Attending: Hematology & Oncology

## 2024-03-17 ENCOUNTER — Encounter: Payer: Self-pay | Admitting: Hematology & Oncology

## 2024-03-17 VITALS — BP 117/82 | HR 58 | Temp 98.1°F | Resp 18 | Wt 166.1 lb

## 2024-03-17 DIAGNOSIS — D472 Monoclonal gammopathy: Secondary | ICD-10-CM

## 2024-03-17 LAB — CMP (CANCER CENTER ONLY)
ALT: 14 U/L (ref 0–44)
AST: 22 U/L (ref 15–41)
Albumin: 4.9 g/dL (ref 3.5–5.0)
Alkaline Phosphatase: 70 U/L (ref 38–126)
Anion gap: 11 (ref 5–15)
BUN: 12 mg/dL (ref 8–23)
CO2: 27 mmol/L (ref 22–32)
Calcium: 10 mg/dL (ref 8.9–10.3)
Chloride: 100 mmol/L (ref 98–111)
Creatinine: 1.08 mg/dL (ref 0.61–1.24)
GFR, Estimated: >60 mL/min (ref >60)
Glucose, Bld: 162 mg/dL — ABNORMAL HIGH (ref 70–99)
Potassium: 5.4 mmol/L — ABNORMAL HIGH (ref 3.5–5.1)
Sodium: 137 mmol/L (ref 135–145)
Total Bilirubin: 0.4 mg/dL (ref 0.0–1.2)
Total Protein: 7.9 g/dL (ref 6.5–8.1)

## 2024-03-17 LAB — CBC WITH DIFFERENTIAL (CANCER CENTER ONLY)
Abs Immature Granulocytes: 0.02 K/uL (ref 0.00–0.07)
Basophils Absolute: 0 K/uL (ref 0.0–0.1)
Basophils Relative: 0 %
Eosinophils Absolute: 0.1 K/uL (ref 0.0–0.5)
Eosinophils Relative: 2 %
HCT: 41 % (ref 39.0–52.0)
Hemoglobin: 13.3 g/dL (ref 13.0–17.0)
Immature Granulocytes: 0 %
Lymphocytes Relative: 25 %
Lymphs Abs: 1.4 K/uL (ref 0.7–4.0)
MCH: 30 pg (ref 26.0–34.0)
MCHC: 32.4 g/dL (ref 30.0–36.0)
MCV: 92.6 fL (ref 80.0–100.0)
Monocytes Absolute: 0.4 K/uL (ref 0.1–1.0)
Monocytes Relative: 7 %
Neutro Abs: 3.7 K/uL (ref 1.7–7.7)
Neutrophils Relative %: 66 %
Platelet Count: 263 K/uL (ref 150–400)
RBC: 4.43 MIL/uL (ref 4.22–5.81)
RDW: 12.8 % (ref 11.5–15.5)
WBC Count: 5.6 K/uL (ref 4.0–10.5)
nRBC: 0 % (ref 0.0–0.2)

## 2024-03-17 NOTE — Progress Notes (Signed)
 Hematology and Oncology Follow Up Visit  [name] [name] [id number] [birth year] 68 y.o. 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  We last saw him back in June.  Since then, he has been doing pretty well.  He and his wife got 2 new dogs.  Very happy about this for them.  Unfortunately, the nephew that they are taking care of who had the Down's syndrome passed away.  They went out to his funeral.  When we last saw him, his monoclonal spike was almost steady at 1.2 g/dL.  His IgG level was 1630 mg/dL.  The Kappa light chain was 17.4 mg/dL.  This is holding steady.    He still has problems with the back pain.  This has been chronic.  He has had no change in bowel or bladder habits.  There has been no problems with fever.  He has had no rashes.  He has had no leg swelling.    Overall, I would say that his performance status is probably ECOG 1.    Medications:  Allergies as of 03/17/2024       Reactions   Aripiprazole Other (See Comments)    leg aching   Codeine Hives   Duloxetine Other (See Comments)   Other reaction(s): ineffective   Duloxetine Hcl Other (See Comments)   Fluoxetine Other (See Comments)   Other reaction(s): ineffective   Other Other (See Comments)   Simvastatin  Other (See Comments)        Medication List        Accurate as of March 17, 2024  1:37 PM. If you have any questions, ask your nurse or doctor.          ALPRAZolam 0.5 MG tablet Commonly known as: XANAX Take 0.5 mg by mouth daily as needed.   clopidogrel  75 MG tablet Commonly known as: PLAVIX  Take 75 mg by mouth daily.   FLUoxetine 20 MG capsule Commonly known as: PROZAC Take 20 mg by mouth daily at 6 (six) AM.   gabapentin 300 MG capsule Commonly known as: NEURONTIN 11/12/2023 TAKE 3 TABS BY MOUTH IN THE  AM & 2 TABS BY MOUTH IN THE PM   metFORMIN  500 MG tablet Commonly known as: GLUCOPHAGE Take 1,000 mg by mouth 2 (two) times daily.   morphine 30 MG 12 hr tablet Commonly known as: MS CONTIN Take 30 mg by mouth every 8 (eight) hours.   nitroGLYCERIN  0.4 MG SL tablet Commonly known as: NITROSTAT  Place 1 tablet (0.4 mg total) under the tongue every 5 (five) minutes as needed for chest pain.   OLANZapine 2.5 MG tablet Commonly known as: ZYPREXA Take 2.5 mg by mouth daily.   OneTouch Delica Plus Lancet33G Misc USE 2 TO 3 TIMES DAILY FOR BLOOD SUGAR CHECK for 100   OneTouch Verio test strip Generic drug: glucose blood USE 1 STRIP TWICE DAILY for 100   Ozempic (1 MG/DOSE) 2 MG/1.5ML Sopn Generic drug: Semaglutide (1 MG/DOSE) Inject 1 mg into the skin once a week.   pantoprazole  40 MG tablet Commonly known as: PROTONIX  Take 1 tablet (40 mg total) by mouth daily.   Percocet 10-325 MG tablet Generic drug: oxyCODONE-acetaminophen Take 1 tablet by mouth 3 (three) times daily as needed.   polyethylene glycol 17 g packet Commonly known as: MIRALAX / GLYCOLAX Take 17 g by mouth  daily as needed.   rosuvastatin 20 MG tablet Commonly known as: CRESTOR Take 20 mg by mouth daily.   sitaGLIPtin 100 MG tablet Commonly known as: JANUVIA Take 100 mg by mouth daily.   zolpidem 10 MG tablet Commonly known as: AMBIEN Take 10 mg by mouth at bedtime as needed.        Allergies:  Allergies  Allergen Reactions   Aripiprazole Other (See Comments)     leg aching   Codeine Hives   Duloxetine Other (See Comments)    Other reaction(s): ineffective   Duloxetine Hcl Other (See Comments)   Fluoxetine Other (See Comments)    Other reaction(s): ineffective   Other Other (See Comments)   Simvastatin  Other (See Comments)    Past Medical History, Surgical history, Social history, and Family History were reviewed and updated.  Review of Systems: Review of Systems  Constitutional: Negative.   HENT: Negative.    Eyes: Negative.    Respiratory: Negative.    Cardiovascular: Negative.   Gastrointestinal: Negative.   Genitourinary: Negative.   Musculoskeletal:  Positive for back pain and myalgias.  Skin:  Positive for rash.  Neurological:  Positive for sensory change.  Psychiatric/Behavioral:  The patient is nervous/anxious.     Physical Exam:  weight is 166 lb 1.3 oz (75.3 kg). His oral temperature is 98.1 F (36.7 C). His blood pressure is 117/82 and his pulse is 58 (abnormal). His respiration is 18 and oxygen saturation is 100%.   Wt Readings from Last 3 Encounters:  03/17/24 166 lb 1.3 oz (75.3 kg)  11/12/23 157 lb 12.8 oz (71.6 kg)  11/03/23 161 lb (73 kg)    Physical Exam Vitals reviewed.  HENT:     Head: Normocephalic and atraumatic.  Eyes:     Pupils: Pupils are equal, round, and reactive to light.  Cardiovascular:     Rate and Rhythm: Normal rate and regular rhythm.     Heart sounds: Normal heart sounds.  Pulmonary:     Effort: Pulmonary effort is normal.     Breath sounds: Normal breath sounds.  Abdominal:     General: Bowel sounds are normal.     Palpations: Abdomen is soft.  Musculoskeletal:        General: No tenderness or deformity. Normal range of motion.     Cervical back: Normal range of motion.  Lymphadenopathy:     Cervical: No cervical adenopathy.  Skin:    General: Skin is warm and dry.     Findings: No erythema or rash.  Neurological:     Mental Status: He is alert and oriented to person, place, and time.  Psychiatric:        Behavior: Behavior normal.        Thought Content: Thought content normal.        Judgment: Judgment normal.     Lab Results  Component Value Date   WBC 5.6 03/17/2024   HGB 13.3 03/17/2024   HCT 41.0 03/17/2024   MCV 92.6 03/17/2024   PLT 263 03/17/2024   Lab Results  Component Value Date   FERRITIN 73.9 11/20/2009   IRON 76 11/20/2009   IRONPCTSAT 22.8 11/20/2009   Lab Results  Component Value Date   RBC 4.43 03/17/2024   Lab  Results  Component Value Date   KPAFRELGTCHN 173.6 (H) 11/12/2023   LAMBDASER 11.2 11/12/2023   KAPLAMBRATIO 15.50 (H) 11/12/2023   Lab Results  Component Value Date   IGGSERUM 1,617 (H) 11/12/2023  IGGSERUM 1,651 (H) 11/12/2023   IGA 83 11/12/2023   IGA 81 11/12/2023   IGMSERUM 19 (L) 11/12/2023   IGMSERUM 20 11/12/2023   Lab Results  Component Value Date   TOTALPROTELP 7.3 11/12/2023   ALBUMINELP 3.7 11/12/2023   A1GS 0.3 11/12/2023   A2GS 0.9 11/12/2023   BETS 1.0 11/12/2023   GAMS 1.4 11/12/2023   MSPIKE 1.2 (H) 11/12/2023     Chemistry      Component Value Date/Time   NA 137 03/17/2024 1204   NA 145 05/07/2017 0945   NA 139 07/25/2016 0815   K 5.4 (H) 03/17/2024 1204   K 3.9 05/07/2017 0945   K 4.0 07/25/2016 0815   CL 100 03/17/2024 1204   CL 100 05/07/2017 0945   CO2 27 03/17/2024 1204   CO2 29 05/07/2017 0945   CO2 28 07/25/2016 0815   BUN 12 03/17/2024 1204   BUN 8 05/07/2017 0945   BUN 9.0 07/25/2016 0815   CREATININE 1.08 03/17/2024 1204   CREATININE 0.8 05/07/2017 0945   CREATININE 0.8 07/25/2016 0815      Component Value Date/Time   CALCIUM 10.0 03/17/2024 1204   CALCIUM 9.7 05/07/2017 0945   CALCIUM 9.6 07/25/2016 0815   ALKPHOS 70 03/17/2024 1204   ALKPHOS 60 05/07/2017 0945   ALKPHOS 57 07/25/2016 0815   AST 22 03/17/2024 1204   AST 15 07/25/2016 0815   ALT 14 03/17/2024 1204   ALT 20 05/07/2017 0945   ALT 13 07/25/2016 0815   BILITOT 0.4 03/17/2024 1204   BILITOT 0.55 07/25/2016 0815     Impression and Plan: Mr. Venn is a 68 yo white male with an IgG Kappa smoldering myeloma diagnosed 12 years ago. He was treated initially with Velcade and Decadron  and then Revlimid.   We will continue to follow him along.  I will try to get him back after the Holiday season.  We will see what his monoclonal studies show.  Right now, he is pretty much asymptomatic.   10/15/20251:37 PM

## 2024-03-18 LAB — KAPPA/LAMBDA LIGHT CHAINS
Kappa free light chain: 142.9 mg/L — ABNORMAL HIGH (ref 3.3–19.4)
Kappa, lambda light chain ratio: 15.37 — ABNORMAL HIGH (ref 0.26–1.65)
Lambda free light chains: 9.3 mg/L (ref 5.7–26.3)

## 2024-03-18 LAB — IGG, IGA, IGM
IgA: 81 mg/dL (ref 61–437)
IgG (Immunoglobin G), Serum: 1774 mg/dL — ABNORMAL HIGH (ref 603–1613)
IgM (Immunoglobulin M), Srm: 20 mg/dL (ref 20–172)

## 2024-03-19 ENCOUNTER — Ambulatory Visit: Payer: Self-pay | Admitting: Hematology & Oncology

## 2024-03-19 NOTE — Telephone Encounter (Signed)
 Advised via MyChart.

## 2024-03-19 NOTE — Telephone Encounter (Signed)
-----   Message from Maude JONELLE Crease sent at 03/19/2024  6:52 AM EDT ----- Please call him know that the light chain levels are better.  Fantastic work.  Jeralyn ----- Message ----- From: Rebecka, Lab In [place] Sent: 03/17/2024  12:35 PM EDT To: Maude JONELLE Crease, MD

## 2024-03-22 LAB — PROTEIN ELECTROPHORESIS, SERUM, WITH REFLEX
A/G Ratio: 1.1 (ref 0.7–1.7)
Albumin ELP: 4.1 g/dL (ref 2.9–4.4)
Alpha-1-Globulin: 0.3 g/dL (ref 0.0–0.4)
Alpha-2-Globulin: 0.9 g/dL (ref 0.4–1.0)
Beta Globulin: 1.1 g/dL (ref 0.7–1.3)
Gamma Globulin: 1.6 g/dL (ref 0.4–1.8)
Globulin, Total: 3.9 g/dL (ref 2.2–3.9)
M-Spike, %: 1.3 g/dL — ABNORMAL HIGH
SPEP Interpretation: 0
Total Protein ELP: 8 g/dL (ref 6.0–8.5)

## 2024-03-22 LAB — IMMUNOFIXATION REFLEX, SERUM
IgA: 78 mg/dL (ref 61–437)
IgG (Immunoglobin G), Serum: 1771 mg/dL — ABNORMAL HIGH (ref 603–1613)
IgM (Immunoglobulin M), Srm: 19 mg/dL — ABNORMAL LOW (ref 20–172)

## 2024-03-23 ENCOUNTER — Encounter: Payer: Self-pay | Admitting: *Deleted

## 2024-05-20 ENCOUNTER — Telehealth (HOSPITAL_BASED_OUTPATIENT_CLINIC_OR_DEPARTMENT_OTHER): Payer: Self-pay | Admitting: *Deleted

## 2024-05-20 ENCOUNTER — Telehealth (HOSPITAL_BASED_OUTPATIENT_CLINIC_OR_DEPARTMENT_OTHER): Payer: Self-pay

## 2024-05-20 NOTE — Telephone Encounter (Signed)
° °  Pre-operative Risk Assessment    Patient Name: STANISLAV GERVASE  DOB: [birth year] MRN: [id number]   Date of last office visit: 11/03/2023 with Dr. Delford Date of next office visit: None  Request for Surgical Clearance    Procedure:  Dental Extraction - Amount of Teeth to be Pulled:  16 surgical extractions   Date of Surgery:  Clearance TBD                                 Surgeon:  Ozell Hurdle, DDS, MD Surgeon's Group or Practice Name:  [facility]  Phone number:  [phone] Fax number:  [phone]   Type of Clearance Requested:   - Medical  - Pharmacy:  Hold Clopidogrel  (Plavix ) - for 7-10 day prior   Type of Anesthesia:  conscious sedation with midazolam  and fentanyl    Additional requests/questions:  None  Signed, Patrcia Iverson CROME   05/20/2024, 11:35 AM

## 2024-05-20 NOTE — Telephone Encounter (Signed)
 I s/w the pt and he has been scheduled tele preop appt 05/24/24. Med rec and consent are done.       Patient Consent for Virtual Visit        [name] [name] has provided verbal consent on 05/20/2024 for a virtual visit (video or telephone).   CONSENT FOR VIRTUAL VISIT FOR:  [name] [name]  By participating in this virtual visit I agree to the following:  I hereby voluntarily request, consent and authorize [facility] and its employed or contracted physicians, physician assistants, nurse practitioners or other licensed health care professionals (the Practitioner), to provide me with telemedicine health care services (the Services) as deemed necessary by the treating Practitioner. I acknowledge and consent to receive the Services by the Practitioner via telemedicine. I understand that the telemedicine visit will involve communicating with the Practitioner through live audiovisual communication technology and the disclosure of certain medical information by electronic transmission. I acknowledge that I have been given the opportunity to request an in-person assessment or other available alternative prior to the telemedicine visit and am voluntarily participating in the telemedicine visit.  I understand that I have the right to withhold or withdraw my consent to the use of telemedicine in the course of my care at any time, without affecting my right to future care or treatment, and that the Practitioner or I may terminate the telemedicine visit at any time. I understand that I have the right to inspect all information obtained and/or recorded in the course of the telemedicine visit and may receive copies of available information for a reasonable fee.  I understand that some of the potential risks of receiving the Services via telemedicine include:  Delay or interruption in medical evaluation due to technological equipment failure or disruption; Information transmitted may not be  sufficient (e.g. poor resolution of images) to allow for appropriate medical decision making by the Practitioner; and/or  In rare instances, security protocols could fail, causing a breach of personal health information.  Furthermore, I acknowledge that it is my responsibility to provide information about my medical history, conditions and care that is complete and accurate to the best of my ability. I acknowledge that Practitioner's advice, recommendations, and/or decision may be based on factors not within their control, such as incomplete or inaccurate data provided by me or distortions of diagnostic images or specimens that may result from electronic transmissions. I understand that the practice of medicine is not an exact science and that Practitioner makes no warranties or guarantees regarding treatment outcomes. I acknowledge that a copy of this consent can be made available to me via my patient portal [facility] MyChart), or I can request a printed copy by calling the office of [facility].    I understand that my insurance will be billed for this visit.   I have read or had this consent read to me. I understand the contents of this consent, which adequately explains the benefits and risks of the Services being provided via telemedicine.  I have been provided ample opportunity to ask questions regarding this consent and the Services and have had my questions answered to my satisfaction. I give my informed consent for the services to be provided through the use of telemedicine in my medical care

## 2024-05-20 NOTE — Telephone Encounter (Signed)
° °  Name: [name] [name]  DOB: 06-Sep-1955  MRN: [id number]  Primary Cardiologist: Maude Emmer, MD   Preoperative team, please contact this patient and set up a phone call appointment for further preoperative risk assessment. Please obtain consent and complete medication review. Thank you for your help.  I confirm that guidance regarding antiplatelet and oral anticoagulation therapy has been completed and, if necessary, noted below.  Per Dr. Emmer: Ok to hold plavix  5 days before dental extraction  I also confirmed the patient resides in the state of [place] . As per [facility] Medical Board telemedicine laws, the patient must reside in the state in which the provider is licensed.   [name], NP 05/20/2024, 2:10 PM [facility]

## 2024-05-20 NOTE — Telephone Encounter (Signed)
 Hey Dr. Nishan, per office protocol, patient has a stent of unknown length. Would you provide recommendations for plavix  hold prior to his surgical extraction of 16 teeth? Please route your response to P CV DIV PREOP. Thank you!

## 2024-05-20 NOTE — Telephone Encounter (Signed)
 I s/w the pt and he has been scheduled tele preop appt 05/24/24. Med rec and consent are done.

## 2024-05-24 ENCOUNTER — Ambulatory Visit: Attending: Cardiology | Admitting: Student

## 2024-05-24 DIAGNOSIS — Z01810 Encounter for preprocedural cardiovascular examination: Secondary | ICD-10-CM

## 2024-05-24 NOTE — Progress Notes (Signed)
 "   Virtual Visit via Telephone Note   Because of [name] [name] co-morbid illnesses, he is at least at moderate risk for complications without adequate follow up.  This format is felt to be most appropriate for this patient at this time.  The patient did not have access to video technology/had technical difficulties with video requiring transitioning to audio format only (telephone).  All issues noted in this document were discussed and addressed.  No physical exam could be performed with this format.  Please refer to the patient's chart for his consent to telehealth for [facility].  Evaluation Performed:  Preoperative cardiovascular risk assessment _____________   Date:  05/24/2024   Patient ID:  [name], [name] 11/26/1955, MRN [id number] Patient Location:  Home Provider location:   Office  Primary Care Provider:  Loreli Kins, MD  Primary Cardiologist:  [facility] Providers Cardiologist:  Maude Emmer, MD   Chief Complaint / Patient Profile   68 y.o. y/o male with a h/o CAD s/p remote stent placement to LCx, hypertension, hyperlipidemia, OSA, GERD, T2DM, myeloma who is pending extraction of 16 teeth by Dr. Sedrick and presents today for telephonic preoperative cardiovascular risk assessment.  History of Present Illness    [name] [name] is a 68 y.o. male who presents via audio/video conferencing for a telehealth visit today.  Pt was last seen in cardiology clinic on 11/03/2023 by Dr. Emmer.  At that time [name] [name] was stable from a cardiac standpoint.  The patient is now pending procedure as outlined above. Since his last visit, he is doing well. Patient denies shortness of breath, dyspnea on exertion, lower extremity edema, orthopnea or PND. No chest pain, pressure, or tightness. No palpitations.  He is not experiencing any lightheadedness, dizziness, presyncope or syncope. He does not participate in routine exercise. He and his wife walk  inconsistently and he plays outside with his dogs. He is independent with ADLs and performs light to moderate household activities.   Past Medical History    Past Medical History:  Diagnosis Date   CAD (coronary artery disease)    stents '99 and '05; patent circ and IM stents cath. myoview  10/08 non ischemic   Chronic back pain    Depression    DM (diabetes mellitus) (HCC)    GERD (gastroesophageal reflux disease)    HLD (hyperlipidemia)    Insomnia    Multiple myeloma    dx 11/11. followed by Dr. Dwana in [place]   OSA (obstructive sleep apnea)    AHI 5 from home sleep test 10/05/09   Peripheral neuropathy    Prostatism    RLS (restless legs syndrome)    responded to iron supplementation   Past Surgical History:  Procedure Laterality Date   IR REMOVAL TUN ACCESS W/ PORT W/O FL MOD SED  12/11/2016   IR TRANSCATH RETRIEVAL FB INCL GUIDANCE (MS)  12/11/2016   IR US  GUIDE VASC ACCESS RIGHT  12/11/2016   L4-5 epidural steriod injection     with fluoroscopic guidance   release of rt long finger A1     pulley with debridement of tenosynovitis   stent placed     95% lesion in AV circumflex that was dilated and stented with an AVE stent. also had 60% lesion in the OM-I and minor disease in LAD and RCA 02/14/98. Dr cloretta cc Dr. Lavona     Allergies  Allergies[1]  Home Medications    Prior to Admission medications  Medication Sig Start Date End Date Taking? Authorizing Provider  ALPRAZolam (XANAX) 0.5 MG tablet Take 0.5 mg by mouth daily as needed.    [provider]  clopidogrel  (PLAVIX ) 75 MG tablet Take 75 mg by mouth daily.    [provider]  FLUoxetine (PROZAC) 20 MG capsule Take 20 mg by mouth daily at 6 (six) AM. 02/20/21   [provider]  gabapentin (NEURONTIN) 300 MG capsule 11/12/2023 TAKE 3 TABS BY MOUTH IN THE  AM & 2 TABS BY MOUTH IN THE PM    [provider]  glucose blood (ONETOUCH VERIO) test strip USE 1 STRIP TWICE DAILY for 100     [provider]  Lancets (ONETOUCH DELICA PLUS LANCET33G) MISC USE 2 TO 3 TIMES DAILY FOR BLOOD SUGAR CHECK for 100    [provider]  metFORMIN (GLUCOPHAGE) 500 MG tablet Take 1,000 mg by mouth 2 (two) times daily.     [provider]  morphine (MS CONTIN) 30 MG 12 hr tablet Take 30 mg by mouth every 8 (eight) hours. 06/23/23   [provider]  nitroGLYCERIN  (NITROSTAT ) 0.4 MG SL tablet Place 1 tablet (0.4 mg total) under the tongue every 5 (five) minutes as needed for chest pain. 11/06/21   Nishan, Peter C, MD  OLANZapine (ZYPREXA) 2.5 MG tablet Take 2.5 mg by mouth daily. 08/13/23   [provider]  pantoprazole  (PROTONIX ) 40 MG tablet Take 1 tablet (40 mg total) by mouth daily. 03/01/13   Nishan, Peter C, MD  PERCOCET 10-325 MG tablet Take 1 tablet by mouth 3 (three) times daily as needed. 06/22/20   [provider]  polyethylene glycol (MIRALAX / GLYCOLAX) 17 g packet Take 17 g by mouth daily as needed.    [provider]  rosuvastatin (CRESTOR) 20 MG tablet Take 20 mg by mouth daily. 02/20/23   [provider]  Semaglutide, 1 MG/DOSE, (OZEMPIC, 1 MG/DOSE,) 2 MG/1.5ML SOPN Inject 1 mg into the skin once a week. 11/19/22   [provider]  sitaGLIPtin (JANUVIA) 100 MG tablet Take 100 mg by mouth daily.    [provider]  zolpidem (AMBIEN) 10 MG tablet Take 10 mg by mouth at bedtime as needed. 08/07/23   [provider]    Physical Exam    Vital Signs:  [name] [name] does not have vital signs available for review today.  Given telephonic nature of communication, physical exam is limited. AAOx3. NAD. Normal affect.  Speech and respirations are unlabored.  Assessment & Plan    Preoperative cardiovascular risk assessment. Extraction of 16 teeth by Dr. Sedrick  Chart reviewed as part of pre-operative protocol coverage. According to the RCRI, patient has a 0.4% risk of MACE. Patient reports  activity equivalent to 5.07 METS (per [facility]).   Given past medical history and time since last visit, based on ACC/AHA guidelines, [name] [name] would be at acceptable risk for the planned procedure without further cardiovascular testing.   Patient was advised that if he develops new symptoms prior to surgery to contact our office to arrange a follow-up appointment.  he verbalized understanding.  Per office protocol, he may hold Plavix  for 5 days prior to procedure and should resume as soon as hemodynamically stable postoperatively.   I will route this recommendation to the requesting party via Epic fax function.  Please call with questions.  Time:   Today, I have spent 6 minutes with the patient with telehealth technology discussing medical  history, symptoms, and management plan.     Barnie Hila, NP  05/24/2024, 10:28 AM     [1]  Allergies Allergen Reactions   Aripiprazole Other (See Comments)     leg aching   Codeine Hives   Duloxetine Other (See Comments)    Other reaction(s): ineffective   Duloxetine Hcl Other (See Comments)   Fluoxetine Other (See Comments)    Other reaction(s): ineffective   Other Other (See Comments)   Simvastatin  Other (See Comments)   "

## 2024-07-14 ENCOUNTER — Inpatient Hospital Stay

## 2024-07-14 ENCOUNTER — Inpatient Hospital Stay: Admitting: Hematology & Oncology
